# Patient Record
Sex: Female | Born: 1945 | Race: White | Hispanic: No | Marital: Single | Smoking: Former smoker
Health system: Southern US, Community
[De-identification: ages and names within clinical notes are randomized; demographics above are authoritative.]

## PROBLEM LIST (undated history)

## (undated) DIAGNOSIS — I10 Essential (primary) hypertension: Secondary | ICD-10-CM

## (undated) DIAGNOSIS — E785 Hyperlipidemia, unspecified: Secondary | ICD-10-CM

## (undated) DIAGNOSIS — G4700 Insomnia, unspecified: Secondary | ICD-10-CM

## (undated) DIAGNOSIS — G250 Essential tremor: Secondary | ICD-10-CM

## (undated) DIAGNOSIS — M1990 Unspecified osteoarthritis, unspecified site: Secondary | ICD-10-CM

## (undated) DIAGNOSIS — J45909 Unspecified asthma, uncomplicated: Secondary | ICD-10-CM

## (undated) DIAGNOSIS — E119 Type 2 diabetes mellitus without complications: Secondary | ICD-10-CM

## (undated) DIAGNOSIS — G2581 Restless legs syndrome: Secondary | ICD-10-CM

## (undated) DIAGNOSIS — F329 Major depressive disorder, single episode, unspecified: Secondary | ICD-10-CM

## (undated) DIAGNOSIS — G20A1 Parkinson's disease without dyskinesia, without mention of fluctuations: Secondary | ICD-10-CM

## (undated) DIAGNOSIS — G20 Parkinson's disease: Secondary | ICD-10-CM

## (undated) DIAGNOSIS — M810 Age-related osteoporosis without current pathological fracture: Secondary | ICD-10-CM

## (undated) DIAGNOSIS — Z8673 Personal history of transient ischemic attack (TIA), and cerebral infarction without residual deficits: Secondary | ICD-10-CM

## (undated) DIAGNOSIS — K219 Gastro-esophageal reflux disease without esophagitis: Secondary | ICD-10-CM

## (undated) DIAGNOSIS — F419 Anxiety disorder, unspecified: Secondary | ICD-10-CM

## (undated) DIAGNOSIS — F32A Depression, unspecified: Secondary | ICD-10-CM

## (undated) HISTORY — PX: ANKLE SURGERY: SHX546

## (undated) HISTORY — DX: Depression, unspecified: F32.A

## (undated) HISTORY — DX: Major depressive disorder, single episode, unspecified: F32.9

## (undated) HISTORY — DX: Personal history of transient ischemic attack (TIA), and cerebral infarction without residual deficits: Z86.73

## (undated) HISTORY — DX: Essential tremor: G25.0

## (undated) HISTORY — DX: Age-related osteoporosis without current pathological fracture: M81.0

## (undated) HISTORY — DX: Anxiety disorder, unspecified: F41.9

## (undated) HISTORY — PX: CATARACT EXTRACTION, BILATERAL: SHX1313

## (undated) HISTORY — PX: ABDOMINAL HYSTERECTOMY: SHX81

## (undated) HISTORY — PX: DEEP BRAIN STIMULATOR PLACEMENT: SHX608

## (undated) HISTORY — DX: Hyperlipidemia, unspecified: E78.5

## (undated) HISTORY — DX: Essential (primary) hypertension: I10

## (undated) HISTORY — PX: HAND SURGERY: SHX662

## (undated) HISTORY — DX: Restless legs syndrome: G25.81

## (undated) HISTORY — PX: EXPLORATORY LAPAROTOMY: SUR591

## (undated) HISTORY — DX: Unspecified asthma, uncomplicated: J45.909

## (undated) HISTORY — DX: Type 2 diabetes mellitus without complications: E11.9

---

## 2006-03-16 ENCOUNTER — Ambulatory Visit: Payer: Self-pay | Admitting: Specialist

## 2006-09-29 ENCOUNTER — Ambulatory Visit: Payer: Self-pay | Admitting: Specialist

## 2008-04-27 ENCOUNTER — Ambulatory Visit: Payer: Self-pay | Admitting: Specialist

## 2010-10-02 DIAGNOSIS — K219 Gastro-esophageal reflux disease without esophagitis: Secondary | ICD-10-CM | POA: Insufficient documentation

## 2010-10-02 DIAGNOSIS — E782 Mixed hyperlipidemia: Secondary | ICD-10-CM | POA: Insufficient documentation

## 2010-10-02 DIAGNOSIS — J449 Chronic obstructive pulmonary disease, unspecified: Secondary | ICD-10-CM | POA: Insufficient documentation

## 2010-10-02 DIAGNOSIS — M810 Age-related osteoporosis without current pathological fracture: Secondary | ICD-10-CM | POA: Insufficient documentation

## 2010-10-02 DIAGNOSIS — E785 Hyperlipidemia, unspecified: Secondary | ICD-10-CM | POA: Insufficient documentation

## 2011-06-02 DIAGNOSIS — E119 Type 2 diabetes mellitus without complications: Secondary | ICD-10-CM | POA: Insufficient documentation

## 2012-01-15 DIAGNOSIS — G2581 Restless legs syndrome: Secondary | ICD-10-CM | POA: Insufficient documentation

## 2012-04-03 HISTORY — PX: BURR HOLE W/ STEREOTACTIC INSERTION OF DBS LEADS / INTRAOP MICROELECTRODE RECORDING: SUR171

## 2012-11-18 DIAGNOSIS — B354 Tinea corporis: Secondary | ICD-10-CM | POA: Insufficient documentation

## 2013-03-18 DIAGNOSIS — G2581 Restless legs syndrome: Secondary | ICD-10-CM | POA: Diagnosis not present

## 2013-03-18 DIAGNOSIS — I10 Essential (primary) hypertension: Secondary | ICD-10-CM | POA: Diagnosis not present

## 2013-03-18 DIAGNOSIS — Z79899 Other long term (current) drug therapy: Secondary | ICD-10-CM | POA: Diagnosis not present

## 2013-03-18 DIAGNOSIS — J441 Chronic obstructive pulmonary disease with (acute) exacerbation: Secondary | ICD-10-CM | POA: Diagnosis not present

## 2013-03-18 DIAGNOSIS — M2550 Pain in unspecified joint: Secondary | ICD-10-CM | POA: Diagnosis not present

## 2013-03-18 DIAGNOSIS — E538 Deficiency of other specified B group vitamins: Secondary | ICD-10-CM | POA: Diagnosis not present

## 2013-03-29 DIAGNOSIS — H4011X0 Primary open-angle glaucoma, stage unspecified: Secondary | ICD-10-CM | POA: Diagnosis not present

## 2013-03-29 DIAGNOSIS — H353190 Nonexudative age-related macular degeneration, unspecified eye, stage unspecified: Secondary | ICD-10-CM | POA: Diagnosis not present

## 2013-03-29 DIAGNOSIS — H539 Unspecified visual disturbance: Secondary | ICD-10-CM | POA: Diagnosis not present

## 2013-03-29 DIAGNOSIS — H02839 Dermatochalasis of unspecified eye, unspecified eyelid: Secondary | ICD-10-CM | POA: Diagnosis not present

## 2013-07-19 DIAGNOSIS — I10 Essential (primary) hypertension: Secondary | ICD-10-CM | POA: Diagnosis not present

## 2013-07-19 DIAGNOSIS — E785 Hyperlipidemia, unspecified: Secondary | ICD-10-CM | POA: Diagnosis not present

## 2013-07-19 DIAGNOSIS — E538 Deficiency of other specified B group vitamins: Secondary | ICD-10-CM | POA: Diagnosis not present

## 2013-07-19 DIAGNOSIS — E119 Type 2 diabetes mellitus without complications: Secondary | ICD-10-CM | POA: Diagnosis not present

## 2013-07-19 DIAGNOSIS — Z79899 Other long term (current) drug therapy: Secondary | ICD-10-CM | POA: Diagnosis not present

## 2013-07-26 DIAGNOSIS — E538 Deficiency of other specified B group vitamins: Secondary | ICD-10-CM | POA: Diagnosis not present

## 2013-07-26 DIAGNOSIS — I10 Essential (primary) hypertension: Secondary | ICD-10-CM | POA: Diagnosis not present

## 2013-07-26 DIAGNOSIS — E119 Type 2 diabetes mellitus without complications: Secondary | ICD-10-CM | POA: Diagnosis not present

## 2013-07-26 DIAGNOSIS — Z79899 Other long term (current) drug therapy: Secondary | ICD-10-CM | POA: Diagnosis not present

## 2013-08-18 DIAGNOSIS — Z5181 Encounter for therapeutic drug level monitoring: Secondary | ICD-10-CM | POA: Diagnosis not present

## 2013-08-18 DIAGNOSIS — R4789 Other speech disturbances: Secondary | ICD-10-CM | POA: Diagnosis not present

## 2013-08-18 DIAGNOSIS — R4701 Aphasia: Secondary | ICD-10-CM | POA: Diagnosis not present

## 2013-08-18 DIAGNOSIS — G259 Extrapyramidal and movement disorder, unspecified: Secondary | ICD-10-CM | POA: Diagnosis not present

## 2013-08-18 DIAGNOSIS — R4182 Altered mental status, unspecified: Secondary | ICD-10-CM | POA: Diagnosis not present

## 2013-08-18 DIAGNOSIS — R259 Unspecified abnormal involuntary movements: Secondary | ICD-10-CM | POA: Diagnosis not present

## 2013-08-18 DIAGNOSIS — Z79899 Other long term (current) drug therapy: Secondary | ICD-10-CM | POA: Diagnosis not present

## 2013-08-18 DIAGNOSIS — I6789 Other cerebrovascular disease: Secondary | ICD-10-CM | POA: Diagnosis not present

## 2013-08-18 DIAGNOSIS — R42 Dizziness and giddiness: Secondary | ICD-10-CM | POA: Diagnosis not present

## 2013-08-23 DIAGNOSIS — G250 Essential tremor: Secondary | ICD-10-CM | POA: Diagnosis not present

## 2013-08-23 DIAGNOSIS — R4789 Other speech disturbances: Secondary | ICD-10-CM | POA: Diagnosis not present

## 2013-08-23 DIAGNOSIS — F8081 Childhood onset fluency disorder: Secondary | ICD-10-CM | POA: Diagnosis not present

## 2013-08-23 DIAGNOSIS — Z79899 Other long term (current) drug therapy: Secondary | ICD-10-CM | POA: Diagnosis not present

## 2013-08-23 DIAGNOSIS — Z9189 Other specified personal risk factors, not elsewhere classified: Secondary | ICD-10-CM | POA: Diagnosis not present

## 2013-08-23 DIAGNOSIS — Z5181 Encounter for therapeutic drug level monitoring: Secondary | ICD-10-CM | POA: Diagnosis not present

## 2013-08-23 DIAGNOSIS — R279 Unspecified lack of coordination: Secondary | ICD-10-CM | POA: Diagnosis not present

## 2013-08-24 DIAGNOSIS — R479 Unspecified speech disturbances: Secondary | ICD-10-CM | POA: Insufficient documentation

## 2013-08-24 DIAGNOSIS — R270 Ataxia, unspecified: Secondary | ICD-10-CM | POA: Insufficient documentation

## 2013-09-05 DIAGNOSIS — I671 Cerebral aneurysm, nonruptured: Secondary | ICD-10-CM | POA: Diagnosis not present

## 2013-09-05 DIAGNOSIS — F8081 Childhood onset fluency disorder: Secondary | ICD-10-CM | POA: Diagnosis not present

## 2013-09-05 DIAGNOSIS — Z9189 Other specified personal risk factors, not elsewhere classified: Secondary | ICD-10-CM | POA: Diagnosis not present

## 2013-09-05 DIAGNOSIS — I517 Cardiomegaly: Secondary | ICD-10-CM | POA: Diagnosis not present

## 2013-09-05 DIAGNOSIS — I658 Occlusion and stenosis of other precerebral arteries: Secondary | ICD-10-CM | POA: Diagnosis not present

## 2013-09-05 DIAGNOSIS — I6529 Occlusion and stenosis of unspecified carotid artery: Secondary | ICD-10-CM | POA: Diagnosis not present

## 2013-09-05 DIAGNOSIS — R279 Unspecified lack of coordination: Secondary | ICD-10-CM | POA: Diagnosis not present

## 2013-09-05 DIAGNOSIS — I7090 Unspecified atherosclerosis: Secondary | ICD-10-CM | POA: Diagnosis not present

## 2013-09-05 DIAGNOSIS — I059 Rheumatic mitral valve disease, unspecified: Secondary | ICD-10-CM | POA: Diagnosis not present

## 2013-09-22 DIAGNOSIS — G250 Essential tremor: Secondary | ICD-10-CM | POA: Diagnosis not present

## 2013-09-22 DIAGNOSIS — Z9181 History of falling: Secondary | ICD-10-CM | POA: Diagnosis not present

## 2013-09-22 DIAGNOSIS — Z79899 Other long term (current) drug therapy: Secondary | ICD-10-CM | POA: Diagnosis not present

## 2013-09-22 DIAGNOSIS — G252 Other specified forms of tremor: Secondary | ICD-10-CM | POA: Diagnosis not present

## 2013-09-22 DIAGNOSIS — R4789 Other speech disturbances: Secondary | ICD-10-CM | POA: Diagnosis not present

## 2013-09-22 DIAGNOSIS — Z7982 Long term (current) use of aspirin: Secondary | ICD-10-CM | POA: Diagnosis not present

## 2013-09-22 DIAGNOSIS — R269 Unspecified abnormalities of gait and mobility: Secondary | ICD-10-CM | POA: Diagnosis not present

## 2013-10-10 DIAGNOSIS — R296 Repeated falls: Secondary | ICD-10-CM | POA: Insufficient documentation

## 2013-11-02 DIAGNOSIS — I10 Essential (primary) hypertension: Secondary | ICD-10-CM | POA: Diagnosis not present

## 2013-11-02 DIAGNOSIS — R252 Cramp and spasm: Secondary | ICD-10-CM | POA: Diagnosis not present

## 2013-11-02 DIAGNOSIS — E119 Type 2 diabetes mellitus without complications: Secondary | ICD-10-CM | POA: Diagnosis not present

## 2013-11-02 DIAGNOSIS — Z79899 Other long term (current) drug therapy: Secondary | ICD-10-CM | POA: Diagnosis not present

## 2013-11-02 DIAGNOSIS — E538 Deficiency of other specified B group vitamins: Secondary | ICD-10-CM | POA: Diagnosis not present

## 2013-12-31 DIAGNOSIS — Z8673 Personal history of transient ischemic attack (TIA), and cerebral infarction without residual deficits: Secondary | ICD-10-CM | POA: Diagnosis not present

## 2013-12-31 DIAGNOSIS — R1110 Vomiting, unspecified: Secondary | ICD-10-CM | POA: Diagnosis not present

## 2013-12-31 DIAGNOSIS — Z87891 Personal history of nicotine dependence: Secondary | ICD-10-CM | POA: Diagnosis not present

## 2013-12-31 DIAGNOSIS — R531 Weakness: Secondary | ICD-10-CM | POA: Diagnosis not present

## 2013-12-31 DIAGNOSIS — Z23 Encounter for immunization: Secondary | ICD-10-CM | POA: Diagnosis not present

## 2013-12-31 DIAGNOSIS — R251 Tremor, unspecified: Secondary | ICD-10-CM | POA: Diagnosis not present

## 2013-12-31 DIAGNOSIS — R4701 Aphasia: Secondary | ICD-10-CM | POA: Diagnosis not present

## 2013-12-31 DIAGNOSIS — I6789 Other cerebrovascular disease: Secondary | ICD-10-CM | POA: Diagnosis not present

## 2013-12-31 DIAGNOSIS — Z885 Allergy status to narcotic agent status: Secondary | ICD-10-CM | POA: Diagnosis not present

## 2013-12-31 DIAGNOSIS — R262 Difficulty in walking, not elsewhere classified: Secondary | ICD-10-CM | POA: Diagnosis not present

## 2013-12-31 DIAGNOSIS — G459 Transient cerebral ischemic attack, unspecified: Secondary | ICD-10-CM | POA: Diagnosis not present

## 2014-01-01 DIAGNOSIS — R569 Unspecified convulsions: Secondary | ICD-10-CM | POA: Diagnosis not present

## 2014-01-01 DIAGNOSIS — Z9689 Presence of other specified functional implants: Secondary | ICD-10-CM | POA: Diagnosis present

## 2014-01-01 DIAGNOSIS — R51 Headache: Secondary | ICD-10-CM | POA: Diagnosis present

## 2014-01-01 DIAGNOSIS — R29898 Other symptoms and signs involving the musculoskeletal system: Secondary | ICD-10-CM | POA: Diagnosis not present

## 2014-01-01 DIAGNOSIS — E538 Deficiency of other specified B group vitamins: Secondary | ICD-10-CM | POA: Diagnosis present

## 2014-01-01 DIAGNOSIS — F329 Major depressive disorder, single episode, unspecified: Secondary | ICD-10-CM | POA: Diagnosis present

## 2014-01-01 DIAGNOSIS — E119 Type 2 diabetes mellitus without complications: Secondary | ICD-10-CM | POA: Diagnosis present

## 2014-01-01 DIAGNOSIS — G250 Essential tremor: Secondary | ICD-10-CM | POA: Diagnosis present

## 2014-01-01 DIAGNOSIS — Z9181 History of falling: Secondary | ICD-10-CM | POA: Diagnosis not present

## 2014-01-01 DIAGNOSIS — R251 Tremor, unspecified: Secondary | ICD-10-CM | POA: Diagnosis not present

## 2014-01-01 DIAGNOSIS — J449 Chronic obstructive pulmonary disease, unspecified: Secondary | ICD-10-CM | POA: Diagnosis present

## 2014-01-01 DIAGNOSIS — I671 Cerebral aneurysm, nonruptured: Secondary | ICD-10-CM | POA: Diagnosis present

## 2014-01-01 DIAGNOSIS — F419 Anxiety disorder, unspecified: Secondary | ICD-10-CM | POA: Diagnosis present

## 2014-01-01 DIAGNOSIS — M1990 Unspecified osteoarthritis, unspecified site: Secondary | ICD-10-CM | POA: Diagnosis present

## 2014-01-01 DIAGNOSIS — Z8673 Personal history of transient ischemic attack (TIA), and cerebral infarction without residual deficits: Secondary | ICD-10-CM | POA: Diagnosis not present

## 2014-01-01 DIAGNOSIS — F8081 Childhood onset fluency disorder: Secondary | ICD-10-CM | POA: Diagnosis not present

## 2014-01-01 DIAGNOSIS — Z885 Allergy status to narcotic agent status: Secondary | ICD-10-CM | POA: Diagnosis not present

## 2014-01-01 DIAGNOSIS — R4789 Other speech disturbances: Secondary | ICD-10-CM | POA: Diagnosis not present

## 2014-01-01 DIAGNOSIS — K5900 Constipation, unspecified: Secondary | ICD-10-CM | POA: Diagnosis present

## 2014-01-01 DIAGNOSIS — K219 Gastro-esophageal reflux disease without esophagitis: Secondary | ICD-10-CM | POA: Diagnosis present

## 2014-01-01 DIAGNOSIS — R112 Nausea with vomiting, unspecified: Secondary | ICD-10-CM | POA: Diagnosis present

## 2014-01-01 DIAGNOSIS — Z7401 Bed confinement status: Secondary | ICD-10-CM | POA: Diagnosis not present

## 2014-01-01 DIAGNOSIS — Z7409 Other reduced mobility: Secondary | ICD-10-CM | POA: Diagnosis present

## 2014-01-01 DIAGNOSIS — G2581 Restless legs syndrome: Secondary | ICD-10-CM | POA: Diagnosis present

## 2014-01-01 DIAGNOSIS — R404 Transient alteration of awareness: Secondary | ICD-10-CM | POA: Diagnosis not present

## 2014-01-01 DIAGNOSIS — Z87891 Personal history of nicotine dependence: Secondary | ICD-10-CM | POA: Diagnosis not present

## 2014-01-01 DIAGNOSIS — I10 Essential (primary) hypertension: Secondary | ICD-10-CM | POA: Diagnosis present

## 2014-01-01 DIAGNOSIS — Z23 Encounter for immunization: Secondary | ICD-10-CM | POA: Diagnosis not present

## 2014-01-01 DIAGNOSIS — R4701 Aphasia: Secondary | ICD-10-CM | POA: Diagnosis present

## 2014-01-01 DIAGNOSIS — Z90722 Acquired absence of ovaries, bilateral: Secondary | ICD-10-CM | POA: Diagnosis present

## 2014-01-01 DIAGNOSIS — R531 Weakness: Secondary | ICD-10-CM | POA: Diagnosis present

## 2014-01-01 DIAGNOSIS — Z9049 Acquired absence of other specified parts of digestive tract: Secondary | ICD-10-CM | POA: Diagnosis present

## 2014-01-01 DIAGNOSIS — Z90710 Acquired absence of both cervix and uterus: Secondary | ICD-10-CM | POA: Diagnosis not present

## 2014-01-01 DIAGNOSIS — E785 Hyperlipidemia, unspecified: Secondary | ICD-10-CM | POA: Diagnosis present

## 2014-01-05 DIAGNOSIS — G259 Extrapyramidal and movement disorder, unspecified: Secondary | ICD-10-CM | POA: Diagnosis not present

## 2014-01-05 DIAGNOSIS — R4789 Other speech disturbances: Secondary | ICD-10-CM | POA: Diagnosis not present

## 2014-01-05 DIAGNOSIS — R4189 Other symptoms and signs involving cognitive functions and awareness: Secondary | ICD-10-CM | POA: Diagnosis not present

## 2014-01-05 DIAGNOSIS — R569 Unspecified convulsions: Secondary | ICD-10-CM | POA: Diagnosis not present

## 2014-01-09 DIAGNOSIS — G252 Other specified forms of tremor: Secondary | ICD-10-CM | POA: Diagnosis not present

## 2014-01-09 DIAGNOSIS — G250 Essential tremor: Secondary | ICD-10-CM | POA: Diagnosis not present

## 2014-03-28 DIAGNOSIS — G20 Parkinson's disease: Secondary | ICD-10-CM | POA: Diagnosis not present

## 2014-03-28 DIAGNOSIS — G250 Essential tremor: Secondary | ICD-10-CM | POA: Diagnosis not present

## 2014-03-28 DIAGNOSIS — E119 Type 2 diabetes mellitus without complications: Secondary | ICD-10-CM | POA: Diagnosis not present

## 2014-03-28 DIAGNOSIS — E782 Mixed hyperlipidemia: Secondary | ICD-10-CM | POA: Diagnosis not present

## 2014-03-28 DIAGNOSIS — M810 Age-related osteoporosis without current pathological fracture: Secondary | ICD-10-CM | POA: Diagnosis not present

## 2014-03-28 DIAGNOSIS — I10 Essential (primary) hypertension: Secondary | ICD-10-CM | POA: Diagnosis not present

## 2014-03-28 DIAGNOSIS — K219 Gastro-esophageal reflux disease without esophagitis: Secondary | ICD-10-CM | POA: Diagnosis not present

## 2014-03-30 ENCOUNTER — Encounter: Payer: Self-pay | Admitting: Neurology

## 2014-04-17 ENCOUNTER — Encounter: Payer: Self-pay | Admitting: Neurology

## 2014-04-17 ENCOUNTER — Ambulatory Visit (INDEPENDENT_AMBULATORY_CARE_PROVIDER_SITE_OTHER): Payer: Medicare Other | Admitting: Neurology

## 2014-04-17 ENCOUNTER — Telehealth: Payer: Self-pay | Admitting: Neurology

## 2014-04-17 VITALS — BP 150/62 | HR 77 | Ht 62.0 in | Wt 149.0 lb

## 2014-04-17 DIAGNOSIS — G250 Essential tremor: Secondary | ICD-10-CM

## 2014-04-17 DIAGNOSIS — G43001 Migraine without aura, not intractable, with status migrainosus: Secondary | ICD-10-CM

## 2014-04-17 DIAGNOSIS — Z9889 Other specified postprocedural states: Secondary | ICD-10-CM

## 2014-04-17 DIAGNOSIS — I773 Arterial fibromuscular dysplasia: Secondary | ICD-10-CM | POA: Diagnosis not present

## 2014-04-17 DIAGNOSIS — Z5181 Encounter for therapeutic drug level monitoring: Secondary | ICD-10-CM

## 2014-04-17 DIAGNOSIS — Z9689 Presence of other specified functional implants: Secondary | ICD-10-CM | POA: Insufficient documentation

## 2014-04-17 DIAGNOSIS — R252 Cramp and spasm: Secondary | ICD-10-CM

## 2014-04-17 LAB — COMPREHENSIVE METABOLIC PANEL
ALK PHOS: 71 U/L (ref 39–117)
ALT: 22 U/L (ref 0–35)
AST: 24 U/L (ref 0–37)
Albumin: 3.9 g/dL (ref 3.5–5.2)
BILIRUBIN TOTAL: 0.3 mg/dL (ref 0.2–1.2)
BUN: 17 mg/dL (ref 6–23)
CALCIUM: 9.1 mg/dL (ref 8.4–10.5)
CO2: 26 mEq/L (ref 19–32)
CREATININE: 1.08 mg/dL (ref 0.50–1.10)
Chloride: 105 mEq/L (ref 96–112)
Glucose, Bld: 177 mg/dL — ABNORMAL HIGH (ref 70–99)
POTASSIUM: 4.2 meq/L (ref 3.5–5.3)
Sodium: 137 mEq/L (ref 135–145)
Total Protein: 6.4 g/dL (ref 6.0–8.3)

## 2014-04-17 LAB — SEDIMENTATION RATE: Sed Rate: 8 mm/hr (ref 0–30)

## 2014-04-17 LAB — CBC WITH DIFFERENTIAL/PLATELET
BASOS ABS: 0.1 10*3/uL (ref 0.0–0.1)
BASOS PCT: 1 % (ref 0–1)
EOS ABS: 0.2 10*3/uL (ref 0.0–0.7)
EOS PCT: 2 % (ref 0–5)
HCT: 38.1 % (ref 36.0–46.0)
Hemoglobin: 12 g/dL (ref 12.0–15.0)
Lymphocytes Relative: 22 % (ref 12–46)
Lymphs Abs: 2.1 10*3/uL (ref 0.7–4.0)
MCH: 25.9 pg — ABNORMAL LOW (ref 26.0–34.0)
MCHC: 31.5 g/dL (ref 30.0–36.0)
MCV: 82.3 fL (ref 78.0–100.0)
MPV: 12 fL (ref 8.6–12.4)
Monocytes Absolute: 0.6 10*3/uL (ref 0.1–1.0)
Monocytes Relative: 6 % (ref 3–12)
NEUTROS PCT: 69 % (ref 43–77)
Neutro Abs: 6.5 10*3/uL (ref 1.7–7.7)
Platelets: 212 10*3/uL (ref 150–400)
RBC: 4.63 MIL/uL (ref 3.87–5.11)
RDW: 14.9 % (ref 11.5–15.5)
WBC: 9.4 10*3/uL (ref 4.0–10.5)

## 2014-04-17 LAB — TSH: TSH: 1.655 u[IU]/mL (ref 0.350–4.500)

## 2014-04-17 MED ORDER — TIZANIDINE HCL 4 MG PO TABS: 8.0000 mg | ORAL_TABLET | Freq: Every day | ORAL | Status: DC

## 2014-04-17 NOTE — Patient Instructions (Addendum)
1.  Start zanaflex for headache and should help sleep:  1/2 tablet nightly for 1 week, then 1 tablet at night for 1 week, then 2 tablets at night thereafter.  You can slow this titration down if you get a hangover effect (makes you too tired during the day).   2. Your provider has requested that you have labwork completed today. Please go to [facility] on the first floor of this building before leaving the office today. 3. We will refer you to Dr Shawnee KnappLevy at [facility]. They will contact you directly with an appt. If you don't hear from them they can be contacted at [phone]. 4. You can try approx 2 oz of Tonic water in the morning and evening to help with leg cramps.

## 2014-04-17 NOTE — Progress Notes (Signed)
Subjective:   [facility] at the request of her PCP at [facility].  The evaluation is for tremor.  She is accompanied by her sister who supplements the history.  The records that were made available to me were reviewed.  I reviewed her [facility] records extensively.  The patient has a history of essential tremor with symptoms since 2009.  She has a strong family history of essential tremor, with her sister also having a DBS placed for essential tremor.  The patient has been on medications in the past for essential tremor including Topamax, propranolol, primidone and gabapentin.  She underwent bilateral VIM DBS at [facility] on 03/23/2012 for essential tremor.  She subsequently had generators placed into the chest and a few months later had the generator replaced in the abdomen because she was complaining about pain and mobility of the generator in the chest.  Over the course of time, the patient has had several episodes that have been investigated by [facility].  She has had ataxic gait and stuttering speech as well as changes in the speech that did not appear to be related to the DBS device.  She had a CT of the brain that did not demonstrate any new infarcts.  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  This is dated 09/05/2013.  There was also evidence of fibromuscular dysplasia in the bilateral high cervical internal carotid arteries.  The patient has also had shaking episodes that did not appear to be tremor.  These were captured on EEG and not felt to be epileptiform.  The patient is on Requip and calls this her "Parkinson's medication" according to [facility] (didn't do that here) although there has been no evidence that she has Parkinson's disease.  She uses that for RLS.  She states that it doesn't help but when I ask her in detail, it is a  cramping of the legs and feet and it occurs during the days and night.  She doesn't describe RLS sx's today.  She also c/o headache.  They are left sided (whole L sided).  They are throbbing and cramping.  She will have nausea and sometimes emesis and sometimes the emesis will come before the headache.  She has no photophobia or phonophobia.  She has had these since she had the surgery.  She didn't consider herself a headachy person prior to the surgery.  She has them bad one day a week but it is always there and dull the other days a week.  She has not been on a preventative headache drug (the preventative tremor drugs that could be used for headache were d/c prior to the surgery).  Current/Previously tried tremor medications: topamax, propranolol, primidone, gabapentin  Current medications that may exacerbate tremor:  n/a  Outside reports reviewed: historical medical records, radiology reports and referral letter/letters.  Allergies  Allergen Reactions  . Bupropion Other (See Comments)  . Codeine Other (See Comments)    Jittery  . Sulfa Antibiotics Other (See Comments)  . Tape Other (See Comments)    Uncoded Allergy. Allergen: BANDAIDS  . Trichlormethiazide Other (See Comments)  . Penicillins Rash  . Silver Rash    Patient has moderate blistering with tegaderm use    Outpatient Encounter Prescriptions as of 04/17/2014  Medication Sig  . aspirin EC 81 MG tablet  Take 81 mg by mouth daily.   . Cholecalciferol (VITAMIN D3) 1000 UNITS CAPS Take 1,000 Units by mouth daily.   . Cyanocobalamin 1000 MCG TBCR Take by mouth daily.   Marland Kitchen escitalopram (LEXAPRO) 20 MG tablet Take 20 mg by mouth daily.   . fluticasone (FLONASE) 50 MCG/ACT nasal spray Place 1 spray into the nose daily.   . Ipratropium-Albuterol (COMBIVENT) 20-100 MCG/ACT AERS respimat Inhale 1 puff into the lungs every 6 (six) hours as needed.   Marland Kitchen losartan (COZAAR) 100 MG tablet Take 100 mg by mouth daily. TAKE 1 TABLET (100 MG TOTAL)  BY MOUTH ONCE DAILY.  . metFORMIN (GLUCOPHAGE-XR) 500 MG 24 hr tablet Take 500 mg by mouth daily with breakfast.   . metoprolol succinate (TOPROL-XL) 50 MG 24 hr tablet Take 50 mg by mouth daily.   . montelukast (SINGULAIR) 10 MG tablet Take 10 mg by mouth at bedtime.   Marland Kitchen omeprazole (PRILOSEC) 20 MG capsule Take 20 mg by mouth daily. TAKE 1 CAPSULE (20 MG TOTAL) BY MOUTH DAILY.  . raloxifene (EVISTA) 60 MG tablet Take 60 mg by mouth daily.   Marland Kitchen rOPINIRole (REQUIP) 2 MG tablet Take 2 mg by mouth at bedtime.   . rosuvastatin (CRESTOR) 40 MG tablet Take 40 mg by mouth daily.     Past Medical History  Diagnosis Date  . Diabetes   . Hypertension   . Asthma   . Essential tremor   . Reflux   . Depression   . Anxiety     Past Surgical History  Procedure Laterality Date  . Burr hole w/ stereotactic insertion of dbs leads / intraop microelectrode recording  04/03/2012  . Abdominal hysterectomy    . Exploratory laparotomy    . Ankle surgery Right   . Hand surgery Bilateral   . Cataract extraction, bilateral      History   Social History  . Marital Status: Single    Spouse Name: N/A    Number of Children: N/A  . Years of Education: N/A   Occupational History  . retired     Manufacturing systems engineer for phones   Social History Main Topics  . Smoking status: Former Smoker    Quit date: 03/10/2012  . Smokeless tobacco: Not on file  . Alcohol Use: No  . Drug Use: No  . Sexual Activity: Not on file   Other Topics Concern  . Not on file   Social History Narrative    Family Status  Relation Status Death Age  . Mother Alive     dementia  . Father Deceased     MI  . Sister Alive     breast cancer  . Sister Alive     ET  . Sister Alive     heart disease  . Brother Alive     ET  . Son Alive     healthy    Review of Systems A complete 10 system ROS was obtained and was negative apart from what is mentioned.   Objective:   VITALS:   Filed Vitals:   04/17/14 0806    BP: 150/62  Pulse: 77  Height: 5\' 2"  (1.575 m)  Weight: 149 lb (67.586 kg)   Gen:  Appears stated age and in NAD. HEENT:  Normocephalic, atraumatic. The mucous membranes are moist. The superficial temporal arteries are without ropiness or tenderness. Cardiovascular: Regular rate and rhythm. Lungs: Clear to auscultation bilaterally. Neck: There are no carotid bruits noted bilaterally.  NEUROLOGICAL:  Orientation:  The patient is alert and oriented x 3.  Recent and remote memory are intact.  Attention span and concentration are normal.  Able to name objects and repeat without trouble.  Fund of knowledge is appropriate Cranial nerves: There is good facial symmetry. The pupils are equal round and reactive to light bilaterally. Fundoscopic exam reveals clear disc margins bilaterally. Extraocular muscles are intact and visual fields are full to confrontational testing. Speech is fluent and clear. Soft palate rises symmetrically and there is no tongue deviation. Hearing is intact to conversational tone. Tone: Tone is good throughout. Sensation: Sensation is intact to light touch and pinprick throughout (facial, trunk, extremities). Vibration is intact at the bilateral big toe. There is no extinction with double simultaneous stimulation. There is no sensory dermatomal level identified. Coordination:  The patient has no dysdiadichokinesia.  She has no difficulty with heel-to-shin.  She does have some difficulty with finger-nose-finger. Motor: Strength is 5/5 in the bilateral upper and lower extremities.  Grip strength is equal bilaterally, but it is decreased.  Shoulder shrug is equal bilaterally.  There is no pronator drift.  There are no fasciculations noted. DTR's: Deep tendon reflexes are 2+/4 at the bilateral biceps, triceps, brachioradialis, patella and achilles.  Plantar responses are downgoing bilaterally. Gait and Station: The patient ambulates with an astasia abasia quality to the gait.  She  bumps into the walls on both sides of the hallway and when she gets out of the chair in the exam room she lurches across the room and falls onto the examination table (about 8 feet away)' she doesn't fall on the floor.  She falls into the chair on the way back.    MOVEMENT EXAM: Tremor:  There is mild tremor in the UE, noted most significantly with action.  The patient has some difficulty drawing Archimedes spirals, left greater than right.  There is no tremor at rest.  She does have tremor when pouring a full glass of water from one glass to another, especially when the full glass of water is in the right hand.    DBS programming was performed today which is described in more detail on a separate programming procedure note.  In brief, there was improvement in the tremor with programming.  It should also be noted that the patient said several times "I feel that you are changing the device" when no changes were being made at all.     Assessment/Plan:   1.  Essential Tremor.  -She is status post bilateral VIM DBS at [facility] on 03/23/2012 with revision of the generator into the abdomen.  -I did increase her sitting somewhat today, as they are fairly low.  2.  Hx of episodic speech changes  -Investigated at [facility] and worked up for stroke.  Does not seem to be related to the DBS.  In fact, turning off DBS makes speech worse.  3.  Hx of episodic "shaking episodes" unrelated to tremor  -captured on EEG at [facility] and felt to be non-epileptogenic  -several non-physiologic aspects to examination and wonder if episodic inability to speak/shaking episodes are stress related.  Talked to her about this today.   4.  Possible fibromuscular dysplasia  -She will be referred to [facility] for an opinion regarding this.  5.  Leg cramping.  -We will try tonic water.  -Likely will d/c requip in the future.  Not helping and sx's not c/w RLS  6.  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   Discussed several agents, including going back on Topamax which she was previously on for tremor.  In the end, decided to start Zanaflex in hopes that it would help her insomnia.  Discussed that it could cause vivid dreams.  Risks, benefits, side effects and alternative therapies were discussed.  The opportunity to ask questions was given and they were answered to the best of my ability.  The patient expressed understanding and willingness to follow the outlined treatment protocols.  7.  Follow-up in the next few months, sooner should new neurologic issues arise.  Greater than 50% of the visit in counseling and coordinating care.

## 2014-04-17 NOTE — Telephone Encounter (Signed)
Referral to [facility]avel [facility] faxed to [phone] with confirmation received. They will contact patient with appt.

## 2014-04-17 NOTE — Procedures (Signed)
DBS Programming was performed.    Total time spent programming was 45 minutes.  Details on separate neurophys worksheet.   Device was confirmed to be on.  Soft start was confirmed to be on but was changed from 4-8.  Impedences were checked and were within normal limits.  Battery was checked and was determined to be functioning normally and not near the end of life.  Final settings were as follows:  Left brain electrode:     2-1+           ; Amplitude  2.6   V   ; Pulse width 90 microseconds;   Frequency   170   Hz.  Right brain electrode:     10-9+          ; Amplitude   2.6  V ;  Pulse width 90  microseconds;  Frequency   170    Hz.

## 2014-04-18 ENCOUNTER — Telehealth: Payer: Self-pay | Admitting: Neurology

## 2014-04-18 NOTE — Telephone Encounter (Signed)
Patient made aware.

## 2014-04-18 NOTE — Telephone Encounter (Signed)
-----   Message from Octaviano Battyebecca S Tat, DO sent at 04/18/2014  8:06 AM EST ----- Beth Young, you can let pt know labs look okay except glucose high, but known DM

## 2014-04-18 NOTE — Telephone Encounter (Signed)
Pt called/returning your call at 10:22AM. C/B [phone]

## 2014-04-18 NOTE — Telephone Encounter (Signed)
Left message on machine for patient to call back.

## 2014-04-25 DIAGNOSIS — E119 Type 2 diabetes mellitus without complications: Secondary | ICD-10-CM | POA: Diagnosis not present

## 2014-04-25 DIAGNOSIS — I773 Arterial fibromuscular dysplasia: Secondary | ICD-10-CM | POA: Insufficient documentation

## 2014-04-25 DIAGNOSIS — G250 Essential tremor: Secondary | ICD-10-CM | POA: Diagnosis not present

## 2014-04-25 DIAGNOSIS — R51 Headache: Secondary | ICD-10-CM | POA: Diagnosis not present

## 2014-04-25 DIAGNOSIS — I10 Essential (primary) hypertension: Secondary | ICD-10-CM | POA: Diagnosis not present

## 2014-05-16 DIAGNOSIS — E559 Vitamin D deficiency, unspecified: Secondary | ICD-10-CM | POA: Diagnosis not present

## 2014-05-16 DIAGNOSIS — K219 Gastro-esophageal reflux disease without esophagitis: Secondary | ICD-10-CM | POA: Diagnosis not present

## 2014-05-16 DIAGNOSIS — I10 Essential (primary) hypertension: Secondary | ICD-10-CM | POA: Diagnosis not present

## 2014-05-16 DIAGNOSIS — E782 Mixed hyperlipidemia: Secondary | ICD-10-CM | POA: Diagnosis not present

## 2014-05-16 DIAGNOSIS — E538 Deficiency of other specified B group vitamins: Secondary | ICD-10-CM | POA: Diagnosis not present

## 2014-05-16 DIAGNOSIS — E119 Type 2 diabetes mellitus without complications: Secondary | ICD-10-CM | POA: Diagnosis not present

## 2014-05-17 DIAGNOSIS — I10 Essential (primary) hypertension: Secondary | ICD-10-CM | POA: Diagnosis not present

## 2014-05-17 DIAGNOSIS — I773 Arterial fibromuscular dysplasia: Secondary | ICD-10-CM | POA: Diagnosis not present

## 2014-05-17 DIAGNOSIS — G44029 Chronic cluster headache, not intractable: Secondary | ICD-10-CM | POA: Diagnosis not present

## 2014-05-17 DIAGNOSIS — I6521 Occlusion and stenosis of right carotid artery: Secondary | ICD-10-CM | POA: Diagnosis not present

## 2014-05-17 DIAGNOSIS — G250 Essential tremor: Secondary | ICD-10-CM | POA: Diagnosis not present

## 2014-05-18 DIAGNOSIS — I10 Essential (primary) hypertension: Secondary | ICD-10-CM | POA: Diagnosis not present

## 2014-05-18 DIAGNOSIS — Z0000 Encounter for general adult medical examination without abnormal findings: Secondary | ICD-10-CM | POA: Diagnosis not present

## 2014-05-18 DIAGNOSIS — M810 Age-related osteoporosis without current pathological fracture: Secondary | ICD-10-CM | POA: Diagnosis not present

## 2014-05-18 DIAGNOSIS — Z23 Encounter for immunization: Secondary | ICD-10-CM | POA: Diagnosis not present

## 2014-05-24 DIAGNOSIS — R42 Dizziness and giddiness: Secondary | ICD-10-CM | POA: Diagnosis not present

## 2014-05-24 DIAGNOSIS — R55 Syncope and collapse: Secondary | ICD-10-CM | POA: Diagnosis not present

## 2014-05-24 DIAGNOSIS — R682 Dry mouth, unspecified: Secondary | ICD-10-CM | POA: Diagnosis not present

## 2014-05-24 DIAGNOSIS — R51 Headache: Secondary | ICD-10-CM | POA: Diagnosis not present

## 2014-05-24 DIAGNOSIS — Z79899 Other long term (current) drug therapy: Secondary | ICD-10-CM | POA: Diagnosis not present

## 2014-05-24 DIAGNOSIS — E780 Pure hypercholesterolemia: Secondary | ICD-10-CM | POA: Diagnosis not present

## 2014-05-24 DIAGNOSIS — I959 Hypotension, unspecified: Secondary | ICD-10-CM | POA: Diagnosis not present

## 2014-05-24 DIAGNOSIS — Z7982 Long term (current) use of aspirin: Secondary | ICD-10-CM | POA: Diagnosis not present

## 2014-05-24 DIAGNOSIS — Z90710 Acquired absence of both cervix and uterus: Secondary | ICD-10-CM | POA: Diagnosis not present

## 2014-05-24 DIAGNOSIS — G20 Parkinson's disease: Secondary | ICD-10-CM | POA: Diagnosis not present

## 2014-05-24 DIAGNOSIS — K219 Gastro-esophageal reflux disease without esophagitis: Secondary | ICD-10-CM | POA: Diagnosis not present

## 2014-05-24 DIAGNOSIS — Z885 Allergy status to narcotic agent status: Secondary | ICD-10-CM | POA: Diagnosis not present

## 2014-05-24 DIAGNOSIS — Z91040 Latex allergy status: Secondary | ICD-10-CM | POA: Diagnosis not present

## 2014-05-24 DIAGNOSIS — R4701 Aphasia: Secondary | ICD-10-CM | POA: Diagnosis not present

## 2014-05-24 DIAGNOSIS — I10 Essential (primary) hypertension: Secondary | ICD-10-CM | POA: Diagnosis not present

## 2014-05-24 DIAGNOSIS — E119 Type 2 diabetes mellitus without complications: Secondary | ICD-10-CM | POA: Diagnosis not present

## 2014-05-24 DIAGNOSIS — G319 Degenerative disease of nervous system, unspecified: Secondary | ICD-10-CM | POA: Diagnosis not present

## 2014-05-24 DIAGNOSIS — F985 Adult onset fluency disorder: Secondary | ICD-10-CM | POA: Diagnosis not present

## 2014-05-24 DIAGNOSIS — Z91048 Other nonmedicinal substance allergy status: Secondary | ICD-10-CM | POA: Diagnosis not present

## 2014-05-24 DIAGNOSIS — Z87891 Personal history of nicotine dependence: Secondary | ICD-10-CM | POA: Diagnosis not present

## 2014-05-24 DIAGNOSIS — G250 Essential tremor: Secondary | ICD-10-CM | POA: Diagnosis not present

## 2014-05-24 DIAGNOSIS — R4781 Slurred speech: Secondary | ICD-10-CM | POA: Diagnosis not present

## 2014-05-24 DIAGNOSIS — G2581 Restless legs syndrome: Secondary | ICD-10-CM | POA: Diagnosis not present

## 2014-05-24 DIAGNOSIS — Z9889 Other specified postprocedural states: Secondary | ICD-10-CM | POA: Diagnosis not present

## 2014-05-24 DIAGNOSIS — M6281 Muscle weakness (generalized): Secondary | ICD-10-CM | POA: Diagnosis not present

## 2014-05-24 DIAGNOSIS — Z8673 Personal history of transient ischemic attack (TIA), and cerebral infarction without residual deficits: Secondary | ICD-10-CM | POA: Diagnosis not present

## 2014-05-25 ENCOUNTER — Encounter: Payer: Self-pay | Admitting: Neurology

## 2014-05-29 ENCOUNTER — Ambulatory Visit: Payer: Medicare Other | Admitting: Neurology

## 2014-05-30 ENCOUNTER — Encounter: Payer: Self-pay | Admitting: Neurology

## 2014-05-30 ENCOUNTER — Telehealth: Payer: Self-pay | Admitting: Neurology

## 2014-05-30 ENCOUNTER — Ambulatory Visit (INDEPENDENT_AMBULATORY_CARE_PROVIDER_SITE_OTHER): Payer: Medicare Other | Admitting: Neurology

## 2014-05-30 VITALS — BP 138/64 | HR 72 | Ht 62.0 in | Wt 148.0 lb

## 2014-05-30 DIAGNOSIS — Z9889 Other specified postprocedural states: Secondary | ICD-10-CM | POA: Diagnosis not present

## 2014-05-30 DIAGNOSIS — G43701 Chronic migraine without aura, not intractable, with status migrainosus: Secondary | ICD-10-CM | POA: Diagnosis not present

## 2014-05-30 DIAGNOSIS — G250 Essential tremor: Secondary | ICD-10-CM | POA: Diagnosis not present

## 2014-05-30 DIAGNOSIS — Z9689 Presence of other specified functional implants: Secondary | ICD-10-CM

## 2014-05-30 DIAGNOSIS — R2681 Unsteadiness on feet: Secondary | ICD-10-CM | POA: Diagnosis not present

## 2014-05-30 DIAGNOSIS — R479 Unspecified speech disturbances: Secondary | ICD-10-CM | POA: Diagnosis not present

## 2014-05-30 DIAGNOSIS — R252 Cramp and spasm: Secondary | ICD-10-CM

## 2014-05-30 NOTE — Telephone Encounter (Signed)
Returning your call please call [phone]

## 2014-05-30 NOTE — Telephone Encounter (Signed)
Patient made aware MR to be scheduled 06/15/2014 at 12:00 pm. Grover Can[facility]Krystal [facility].

## 2014-05-30 NOTE — Procedures (Signed)
DBS Programming was performed.    Total time spent programming was 25 minutes.  Details on separate neurophys worksheet.   Device was confirmed to be on.  Soft start was confirmed to be on at 8.  Impedences were checked and were within normal limits.  Battery was checked and was determined to be functioning normally and not near the end of life.  Final settings were as follows:  Left brain electrode:     2-1+           ; Amplitude  2.7   V   ; Pulse width 90 microseconds;   Frequency   160   Hz.  Right brain electrode:     10-9+          ; Amplitude   2.7  V ;  Pulse width 90  microseconds;  Frequency   160    Hz.

## 2014-05-30 NOTE — Telephone Encounter (Signed)
Left message on machine for patient to call back. To see if okay for her MR brain to be scheduled on 06/15/2014 at 12:00pm. Awaiting call back.

## 2014-05-30 NOTE — Progress Notes (Signed)
Subjective:   Beth Young was seen in consultation in the movement disorder clinic at the request of her PCP at [facility].  The evaluation is for tremor.  She is accompanied by her sister who supplements the history.  The records that were made available to me were reviewed.  I reviewed her [facility] records extensively.  The patient has a history of essential tremor with symptoms since 2009.  She has a strong family history of essential tremor, with her sister also having a DBS placed for essential tremor.  The patient has been on medications in the past for essential tremor including Topamax, propranolol, primidone and gabapentin.  She underwent bilateral VIM DBS at [facility] on 03/23/2012 for essential tremor.  She subsequently had generators placed into the chest and a few months later had the generator replaced in the abdomen because she was complaining about pain and mobility of the generator in the chest.  Over the course of time, the patient has had several episodes that have been investigated by [facility].  She has had ataxic gait and stuttering speech as well as changes in the speech that did not appear to be related to the DBS device.  She had a CT of the brain that did not demonstrate any new infarcts.  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  This is dated 09/05/2013.  There was also evidence of fibromuscular dysplasia in the bilateral high cervical internal carotid arteries.  The patient has also had shaking episodes that did not appear to be tremor.  These were captured on EEG and not felt to be epileptiform.  The patient is on Requip and calls this her "Parkinson's medication" according to [facility] (didn't do that here) although there has been no evidence that she has Parkinson's disease.  She uses that for RLS.  She states that it doesn't help but when I ask her in detail, it is a  cramping of the legs and feet and it occurs during the days and night.  She doesn't describe RLS sx's today.  She also c/o headache.  They are left sided (whole L sided).  They are throbbing and cramping.  She will have nausea and sometimes emesis and sometimes the emesis will come before the headache.  She has no photophobia or phonophobia.  She has had these since she had the surgery.  She didn't consider herself a headachy person prior to the surgery.  She has them bad one day a week but it is always there and dull the other days a week.  She has not been on a preventative headache drug (the preventative tremor drugs that could be used for headache were d/c prior to the surgery).  05/30/14 update:  The patient is seen today, accompanied by her sister who supplements the history.  Patient has a history of essential tremor.  She is status post deep brain stimulator placement on 03/23/2012 at [facility].  I reviewed records since last visit.  She went to [facility] as [facility] had reported that she had fibromuscular dysplasia.  She saw a vascular specialist in this field at [facility].  There was no evidence on his examination of fibromuscular dysplasia.  She had a carotid ultrasound that demonstrated 40-50% stenosis in the left internal carotid artery and normal in the right internal carotid artery.  Her renal duplex was negative.  She had  a normal CRP and normal CMP.  However, he felt that she had uncontrolled HTN that should be followed up on.   Therefore, her metoprolol was increased and she began to feel bad.  Her BP dropped and she began to have falls.  She went to the ER where BP was low and losartan was d/c.   She states that she had emesis in the ER but had "my headaches."  She states that the zanaflex isn't really helping the headaches but may be helping sleep some.  Pt states that headaches are in the left occiput and are sharp in quality.  She can have emesis one day a week with them but headaches are daily.  She  sees black spots, like "gnats" in the visual field with them.  No photophobia.  No phonophobia.  Had them the last several years.  Has not been checking BS.   She states that they told her in the hospital that her parkinsons may be getting worse.  I explained to her that she doesn't have parkinsons, but she was appararently was told by her previous PCP that she had PD.  I noted this same comment previously in her [facility] records.  She remains on requip but she states that it isn't helping the cramping in the legs at night.  She describes no sx's c/w RLS.  She does state that after her BP dropped and she was in the hospital, her stuttering speech came back; "it happens every time."  Current/Previously tried tremor medications: topamax, propranolol, primidone, gabapentin, zanaflex  Current medications that may exacerbate tremor:  n/a  Outside reports reviewed: historical medical records, radiology reports and referral letter/letters.  Allergies  Allergen Reactions  . Bupropion Other (See Comments)  . Codeine Other (See Comments)    Jittery  . Sulfa Antibiotics Other (See Comments)  . Tape Other (See Comments)    Uncoded Allergy. Allergen: BANDAIDS  . Trichlormethiazide Other (See Comments)  . Penicillins Rash  . Silver Rash    Patient has moderate blistering with tegaderm use    Outpatient Encounter Prescriptions as of 05/30/2014  Medication Sig  . aspirin EC 81 MG tablet Take 81 mg by mouth daily.   . Cholecalciferol (VITAMIN D3) 1000 UNITS CAPS Take 1,000 Units by mouth daily.   . Cyanocobalamin 1000 MCG TBCR Take by mouth daily.   Marland Kitchen escitalopram (LEXAPRO) 20 MG tablet Take 20 mg by mouth daily.   . fluticasone (FLONASE) 50 MCG/ACT nasal spray Place 1 spray into the nose daily.   . Ipratropium-Albuterol (COMBIVENT) 20-100 MCG/ACT AERS respimat Inhale 1 puff into the lungs every 6 (six) hours as needed.   . metFORMIN (GLUCOPHAGE-XR) 500 MG 24 hr tablet Take 500 mg by mouth daily with  breakfast.   . metoprolol succinate (TOPROL-XL) 50 MG 24 hr tablet Take 50 mg by mouth daily.   . montelukast (SINGULAIR) 10 MG tablet Take 10 mg by mouth at bedtime.   Marland Kitchen omeprazole (PRILOSEC) 20 MG capsule Take 20 mg by mouth daily. TAKE 1 CAPSULE (20 MG TOTAL) BY MOUTH DAILY.  . raloxifene (EVISTA) 60 MG tablet Take 60 mg by mouth daily.   Marland Kitchen rOPINIRole (REQUIP) 2 MG tablet Take 2 mg by mouth at bedtime.   . rosuvastatin (CRESTOR) 40 MG tablet Take 40 mg by mouth daily.   Marland Kitchen tiZANidine (ZANAFLEX) 4 MG tablet Take 2 tablets (8 mg total) by mouth at bedtime.  . [DISCONTINUED] losartan (COZAAR) 100 MG tablet Take 100 mg by  mouth daily. TAKE 1 TABLET (100 MG TOTAL) BY MOUTH ONCE DAILY.    Past Medical History  Diagnosis Date  . Diabetes   . Hypertension   . Asthma   . Essential tremor   . Reflux   . Depression   . Anxiety     Past Surgical History  Procedure Laterality Date  . Burr hole w/ stereotactic insertion of dbs leads / intraop microelectrode recording  04/03/2012  . Abdominal hysterectomy    . Exploratory laparotomy    . Ankle surgery Right   . Hand surgery Bilateral   . Cataract extraction, bilateral      History   Social History  . Marital Status: Single    Spouse Name: N/A  . Number of Children: N/A  . Years of Education: N/A   Occupational History  . 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 Smoking status: Former Smoker    Quit date: 03/10/2012  . Smokeless tobacco: Not on file  . Alcohol Use: No  . Drug Use: No  . Sexual Activity: Not on file   Other Topics Concern  . Not on file   Social History Narrative    Family Status  Relation Status Death Age  . Mother Alive     dementia  . Father Deceased     MI  . Sister Alive     breast cancer  . Sister Alive     ET  . Sister Alive     heart disease  . Brother Alive     ET  . Son Alive     healthy    Review of Systems A complete 10 system ROS was obtained  and was negative apart from what is mentioned.   Objective:   VITALS:   Filed Vitals:   05/30/14 0750  BP: 138/64  Pulse: 72  Height: 5\' 2"  (1.575 m)  Weight: 148 lb (67.132 kg)   Gen:  Appears stated age and in NAD. HEENT:  Normocephalic, atraumatic. The mucous membranes are moist. The superficial temporal arteries are without ropiness or tenderness. Cardiovascular: Regular rate and rhythm. Lungs: Clear to auscultation bilaterally. Neck: There are no carotid bruits noted bilaterally.  NEUROLOGICAL:  Orientation:  The patient is alert and oriented x 3.  Recent and remote memory are intact.  Attention span and concentration are normal.  Able to name objects and repeat without trouble.  Fund of knowledge is appropriate Cranial nerves: There is good facial symmetry. The pupils are equal round and reactive to light bilaterally. Fundoscopic exam reveals clear disc margins bilaterally. Extraocular muscles are intact and visual fields are full to confrontational testing. Speech is intermittently with a stuttering quality and then, when distracted, it is very fluent and clear Soft palate rises symmetrically and there is no tongue deviation. Hearing is intact to conversational tone. Tone: Tone is good throughout.  No rigidity Sensation: Sensation is intact to light touch throughout Coordination:  The patient has no dysdiadichokinesia.  She has no difficulty with heel-to-shin.  She does have some difficulty with finger-nose-finger. Motor: Strength is 5/5 in the bilateral upper and lower extremities.  Grip strength is equal bilaterally, but it is decreased.  Shoulder shrug is equal bilaterally.  There is no pronator drift.  There are no fasciculations noted. Gait and Station: The patient ambulates with an astasia abasia quality to the gait.    MOVEMENT EXAM: Tremor:  There is no tremor today  DBS programming was performed today which is described in more detail on a separate programming procedure  note.       Assessment/Plan:   1.  Essential Tremor.  -She is status post bilateral VIM DBS at [facility] on 03/23/2012 with revision of the generator into the abdomen.  2.  Hx of episodic speech changes  -Investigated at [facility] and worked up for stroke.  Does not seem to be related to the DBS.  In fact, turning off DBS makes speech worse.  -suspect psychogenic based on examination today.  Will do MRI if device compatible given new FDA indication.  Device is in bipolar mode as required by FDA  -send to ST  3.  Hx of episodic "shaking episodes" unrelated to tremor  -captured on EEG at [facility] and felt to be non-epileptogenic   4.  Possible fibromuscular dysplasia  -She saw a vascular specialist in this field at [facility].  There was no evidence on his examination of fibromuscular dysplasia.  5.  Leg cramping.  -We will try tonic water.  -d/c requip as was being used for RLS and has no sx's c/w RLS.  Explained to patient in detail that she doesn' have PD as she kept referring to her parkinsons disease.  No evidence of parkinsons disease.  6.  Chronic migraine with aura  -talked about prophylaxis.  Tried and failed several (topamax, zanaflex, propranolol, gabapentin).  Talked to her about botox.  The patient was educated on the botulinum toxin the black blox warning and given a copy of the botox patient medication guide.  The patient understands that this warning states that there have been reported cases of the Botox extending beyond the injection site and creating adverse effects, similar to those of botulism. This included loss of strength, trouble walking, hoarseness, trouble saying words clearly, loss of bladder control, trouble breathing, trouble swallowing, diplopia, blurry vision and ptosis. Most of the distant spread of Botox was happening in patients, primarily children, who received medication for spasticity or for cervical dystonia. The patient expressed understanding and desire to proceed.  She  is interested and will try to get prior auth  7.  Gait instability  -some appears non-physiologic but some could be related to BP changes, changes with ET, etc.  Will send for PT  8.  Follow-up in the next few months, sooner should new neurologic issues arise.  Greater than 50% of the 40 min visit in counseling and coordinating care.  This does not include programming time.

## 2014-05-30 NOTE — Patient Instructions (Signed)
1. Decrease Requip to 1/2 tablet for one week and then stop.  2. We will let you know when Botox is approved.

## 2014-06-01 DIAGNOSIS — I10 Essential (primary) hypertension: Secondary | ICD-10-CM | POA: Diagnosis not present

## 2014-06-15 ENCOUNTER — Ambulatory Visit (HOSPITAL_COMMUNITY)
Admission: RE | Admit: 2014-06-15 | Discharge: 2014-06-15 | Disposition: A | Discharge status: Home / Self Care | Payer: Medicare Other | Source: Ambulatory Visit | Attending: Neurology | Admitting: Neurology

## 2014-06-15 ENCOUNTER — Encounter (HOSPITAL_COMMUNITY): Payer: Self-pay

## 2014-06-15 DIAGNOSIS — E119 Type 2 diabetes mellitus without complications: Secondary | ICD-10-CM | POA: Diagnosis not present

## 2014-06-15 DIAGNOSIS — G43701 Chronic migraine without aura, not intractable, with status migrainosus: Secondary | ICD-10-CM | POA: Diagnosis not present

## 2014-06-15 DIAGNOSIS — R479 Unspecified speech disturbances: Secondary | ICD-10-CM | POA: Diagnosis not present

## 2014-06-15 DIAGNOSIS — Z4542 Encounter for adjustment and management of neuropacemaker (brain) (peripheral nerve) (spinal cord): Secondary | ICD-10-CM | POA: Diagnosis not present

## 2014-06-15 DIAGNOSIS — R2681 Unsteadiness on feet: Secondary | ICD-10-CM | POA: Diagnosis not present

## 2014-06-15 DIAGNOSIS — E785 Hyperlipidemia, unspecified: Secondary | ICD-10-CM | POA: Diagnosis not present

## 2014-06-19 ENCOUNTER — Ambulatory Visit (HOSPITAL_COMMUNITY): Payer: Medicare Other | Admitting: Speech Pathology

## 2014-07-07 ENCOUNTER — Ambulatory Visit (INDEPENDENT_AMBULATORY_CARE_PROVIDER_SITE_OTHER): Payer: Medicare Other | Admitting: Neurology

## 2014-07-07 DIAGNOSIS — G43701 Chronic migraine without aura, not intractable, with status migrainosus: Secondary | ICD-10-CM

## 2014-07-07 DIAGNOSIS — IMO0002 Reserved for concepts with insufficient information to code with codable children: Secondary | ICD-10-CM

## 2014-07-07 DIAGNOSIS — G43709 Chronic migraine without aura, not intractable, without status migrainosus: Secondary | ICD-10-CM

## 2014-07-07 MED ORDER — ONABOTULINUMTOXINA 100 UNITS IJ SOLR
100.0000 [IU] | Freq: Once | INTRAMUSCULAR | Status: AC
  Administered 2014-07-07: 100 [IU] via INTRAMUSCULAR

## 2014-07-07 NOTE — Procedures (Signed)
Botulinum Clinic   Procedure Note Botox  Attending: Dr. Lurena Joinerebecca Shamaine Mulkern  Preoperative Diagnosis(es): Blepharospasm    Consent obtained from: The patient Benefits discussed included, but were not limited to ability to open eyes and therefore see better.  Risk discussed included, but were not limited pain and discomfort, bleeding, bruising, excessive weakness, venous thrombosis, muscle atrophy and drooping of eyelids.  A copy of the patient medication guide was given to the patient which explains the blackbox warning.  Patients identity and treatment sites confirmed Yes.  .  Details of Procedure: Skin was cleaned with alcohol.  A 30 gauge, 1/2 inch needle was introduced to the target muscle.  Prior to injection, the needle plunger was aspirated with each injection to make sure the needle was not within a blood vessel.  There was no blood retrieved on aspiration.    Following is a summary of the muscles injected  And the amount of Botulinum toxin used:   Dilution 0.9% preservative free saline mixed with 100 u Botox type A to make 5 U per 0.1cc (2 cc of saline used per 100 U botox)  Injections  Right frontalis: 5 units + 5 units for a total of 10 units Left frontalis:  5 units + 5 units for a total of 10 units Right corrugator: 2.5 units + 2.5 units for a total of 5 units Left corrugator:  2.5 units + 2.5 units for a total of 5 units Procereus:  5 units Left temporalis:  5 units + 5 units for a total of 10 units Right temporalis:  5 units + 5 units for a total of 10 units Right cervical paraspinals:  5 units + 5 units for a total of 10 units Left cervical paraspinals:  5 units + 5 units for a total of 10 units Right occiput:  5 units + 5 units for a total of 10 units Left occiput:  5 units + 5 units for a total of 10 units --------------------------------------------------------------------------------------  A total of 95 units is injected.  5 units is wasted.  Agent: Botulinum Type  A ( Onobotulinum Toxin type A ).  1 vials of Botox were used, each containing 50 units and freshly diluted with 2 mL of sterile, non-perserved saline   Total injected (Units): 95  Total wasted (Units): 5   Pt tolerated procedure well without complications.   Reinjection is anticipated in 3 months.  Pt given migraine diary to keep.

## 2014-07-18 ENCOUNTER — Ambulatory Visit: Payer: Medicare Other | Admitting: Neurology

## 2014-08-08 DIAGNOSIS — R112 Nausea with vomiting, unspecified: Secondary | ICD-10-CM | POA: Insufficient documentation

## 2014-08-09 DIAGNOSIS — G2581 Restless legs syndrome: Secondary | ICD-10-CM | POA: Diagnosis not present

## 2014-08-09 DIAGNOSIS — Z885 Allergy status to narcotic agent status: Secondary | ICD-10-CM | POA: Diagnosis not present

## 2014-08-09 DIAGNOSIS — I10 Essential (primary) hypertension: Secondary | ICD-10-CM | POA: Diagnosis not present

## 2014-08-09 DIAGNOSIS — G250 Essential tremor: Secondary | ICD-10-CM | POA: Diagnosis not present

## 2014-08-09 DIAGNOSIS — Z8673 Personal history of transient ischemic attack (TIA), and cerebral infarction without residual deficits: Secondary | ICD-10-CM | POA: Diagnosis not present

## 2014-08-09 DIAGNOSIS — R42 Dizziness and giddiness: Secondary | ICD-10-CM | POA: Diagnosis not present

## 2014-08-09 DIAGNOSIS — E785 Hyperlipidemia, unspecified: Secondary | ICD-10-CM | POA: Diagnosis not present

## 2014-08-09 DIAGNOSIS — Z7982 Long term (current) use of aspirin: Secondary | ICD-10-CM | POA: Diagnosis not present

## 2014-08-09 DIAGNOSIS — Z87891 Personal history of nicotine dependence: Secondary | ICD-10-CM | POA: Diagnosis not present

## 2014-08-09 DIAGNOSIS — R4701 Aphasia: Secondary | ICD-10-CM | POA: Diagnosis not present

## 2014-08-09 DIAGNOSIS — R51 Headache: Secondary | ICD-10-CM | POA: Diagnosis not present

## 2014-08-09 DIAGNOSIS — K219 Gastro-esophageal reflux disease without esophagitis: Secondary | ICD-10-CM | POA: Diagnosis not present

## 2014-08-09 DIAGNOSIS — R471 Dysarthria and anarthria: Secondary | ICD-10-CM | POA: Diagnosis not present

## 2014-08-09 DIAGNOSIS — R112 Nausea with vomiting, unspecified: Secondary | ICD-10-CM | POA: Diagnosis not present

## 2014-08-09 DIAGNOSIS — Z9889 Other specified postprocedural states: Secondary | ICD-10-CM | POA: Diagnosis not present

## 2014-08-09 DIAGNOSIS — E119 Type 2 diabetes mellitus without complications: Secondary | ICD-10-CM | POA: Diagnosis not present

## 2014-08-09 DIAGNOSIS — Z79899 Other long term (current) drug therapy: Secondary | ICD-10-CM | POA: Diagnosis not present

## 2014-08-09 DIAGNOSIS — R4702 Dysphasia: Secondary | ICD-10-CM | POA: Diagnosis not present

## 2014-08-10 DIAGNOSIS — G250 Essential tremor: Secondary | ICD-10-CM | POA: Diagnosis not present

## 2014-08-10 DIAGNOSIS — R51 Headache: Secondary | ICD-10-CM | POA: Diagnosis not present

## 2014-08-10 DIAGNOSIS — R4701 Aphasia: Secondary | ICD-10-CM | POA: Diagnosis not present

## 2014-08-11 ENCOUNTER — Encounter: Payer: Self-pay | Admitting: Neurology

## 2014-08-11 ENCOUNTER — Ambulatory Visit (INDEPENDENT_AMBULATORY_CARE_PROVIDER_SITE_OTHER): Payer: Medicare Other | Admitting: Neurology

## 2014-08-11 VITALS — BP 128/60 | HR 80 | Ht 62.0 in | Wt 147.0 lb

## 2014-08-11 DIAGNOSIS — H8110 Benign paroxysmal vertigo, unspecified ear: Secondary | ICD-10-CM

## 2014-08-11 DIAGNOSIS — R42 Dizziness and giddiness: Secondary | ICD-10-CM | POA: Diagnosis not present

## 2014-08-11 DIAGNOSIS — G250 Essential tremor: Secondary | ICD-10-CM | POA: Diagnosis not present

## 2014-08-11 DIAGNOSIS — Z9889 Other specified postprocedural states: Secondary | ICD-10-CM

## 2014-08-11 DIAGNOSIS — Z9689 Presence of other specified functional implants: Secondary | ICD-10-CM

## 2014-08-11 NOTE — Progress Notes (Signed)
Subjective:   Beth Young was seen in consultation in the movement disorder clinic at the request of her PCP at [facility].  The evaluation is for tremor.  She is accompanied by her sister who supplements the history.  The records that were made available to me were reviewed.  I reviewed her [facility] records extensively.  The patient has a history of essential tremor with symptoms since 2009.  She has a strong family history of essential tremor, with her sister also having a DBS placed for essential tremor.  The patient has been on medications in the past for essential tremor including Topamax, propranolol, primidone and gabapentin.  She underwent bilateral VIM DBS at [facility] on 03/23/2012 for essential tremor.  She subsequently had generators placed into the chest and a few months later had the generator replaced in the abdomen because she was complaining about pain and mobility of the generator in the chest.  Over the course of time, the patient has had several episodes that have been investigated by [facility].  She has had ataxic gait and stuttering speech as well as changes in the speech that did not appear to be related to the DBS device.  She had a CT of the brain that did not demonstrate any new infarcts.  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  This is dated 09/05/2013.  There was also evidence of fibromuscular dysplasia in the bilateral high cervical internal carotid arteries.  The patient has also had shaking episodes that did not appear to be tremor.  These were captured on EEG and not felt to be epileptiform.  The patient is on Requip and calls this her "Parkinson's medication" according to [facility] (didn't do that here) although there has been no evidence that she has Parkinson's disease.  She uses that for RLS.  She states that it doesn't help but when I ask her in detail, it is a  cramping of the legs and feet and it occurs during the days and night.  She doesn't describe RLS sx's today.  She also c/o headache.  They are left sided (whole L sided).  They are throbbing and cramping.  She will have nausea and sometimes emesis and sometimes the emesis will come before the headache.  She has no photophobia or phonophobia.  She has had these since she had the surgery.  She didn't consider herself a headachy person prior to the surgery.  She has them bad one day a week but it is always there and dull the other days a week.  She has not been on a preventative headache drug (the preventative tremor drugs that could be used for headache were d/c prior to the surgery).  05/30/14 update:  The patient is seen today, accompanied by her sister who supplements the history.  Patient has a history of essential tremor.  She is status post deep brain stimulator placement on 03/23/2012 at [facility].  I reviewed records since last visit.  She went to [facility] as [facility] had reported that she had fibromuscular dysplasia.  She saw a vascular specialist in this field at [facility].  There was no evidence on his examination of fibromuscular dysplasia.  She had a carotid ultrasound that demonstrated 40-50% stenosis in the left internal carotid artery and normal in the right internal carotid artery.  Her renal duplex was negative.  She had  a normal CRP and normal CMP.  However, he felt that she had uncontrolled HTN that should be followed up on.   Therefore, her metoprolol was increased and she began to feel bad.  Her BP dropped and she began to have falls.  She went to the ER where BP was low and losartan was d/c.   She states that she had emesis in the ER but had "my headaches."  She states that the zanaflex isn't really helping the headaches but may be helping sleep some.  Pt states that headaches are in the left occiput and are sharp in quality.  She can have emesis one day a week with them but headaches are daily.  She  sees black spots, like "gnats" in the visual field with them.  No photophobia.  No phonophobia.  Had them the last several years.  Has not been checking BS.   She states that they told her in the hospital that her parkinsons may be getting worse.  I explained to her that she doesn't have parkinsons, but she was appararently was told by her previous PCP that she had PD.  I noted this same comment previously in her [facility] records.  She remains on requip but she states that it isn't helping the cramping in the legs at night.  She describes no sx's c/w RLS.  She does state that after her BP dropped and she was in the hospital, her stuttering speech came back; "it happens every time."  08/11/14 update:  Pt requested work in appt today.  Went to [facility] 2 days ago and was released yesterday.  I don't have records.  She had acute onset of dizziness, bp elevation and emesis.  She states that her BP at home was 183/53 and was in the 190's when she went to her PCP.  She states that after her BP was elevated she got a bad headache.  They apparently did a head CT that was negative.  She did bring in labs including cmp and UA that was normal.  She was told at the hospital that she couldn't have anything for the headache because she got botox.  She was told this by a neurologist through telemedicine at [facility].  She states that she had previously been doing great with the botox.  She does state that this was her normal headache but the dizziness was different.  She states that the dizziness is spinning.  It is worse with bending over.  No nausea or emesis since released.  Current/Previously tried tremor medications: topamax, propranolol, primidone, gabapentin, zanaflex  Current medications that may exacerbate tremor:  n/a  Outside reports reviewed: historical medical records, radiology reports and referral letter/letters.  Allergies  Allergen Reactions  . Bupropion Other (See Comments)  . Codeine Other (See  Comments)    Jittery  . Sulfa Antibiotics Other (See Comments)  . Tape Other (See Comments)    Uncoded Allergy. Allergen: BANDAIDS  . Trichlormethiazide Other (See Comments)  . Penicillins Rash  . Silver Rash    Patient has moderate blistering with tegaderm use    Outpatient Encounter Prescriptions as of 08/11/2014  Medication Sig  . aspirin EC 81 MG tablet Take 81 mg by mouth daily.   . Cholecalciferol (VITAMIN D3) 1000 UNITS CAPS Take 1,000 Units by mouth daily.   . Cyanocobalamin 1000 MCG TBCR Take by mouth daily.   Marland Kitchen. escitalopram (LEXAPRO) 20 MG tablet Take 20 mg by mouth daily.   . fluticasone (FLONASE)  50 MCG/ACT nasal spray Place 1 spray into the nose daily.   . Ipratropium-Albuterol (COMBIVENT) 20-100 MCG/ACT AERS respimat Inhale 1 puff into the lungs every 6 (six) hours as needed.   . metFORMIN (GLUCOPHAGE-XR) 500 MG 24 hr tablet Take 500 mg by mouth daily with breakfast.   . metoprolol succinate (TOPROL-XL) 50 MG 24 hr tablet Take 50 mg by mouth daily.   . montelukast (SINGULAIR) 10 MG tablet Take 10 mg by mouth at bedtime.   Marland Kitchen omeprazole (PRILOSEC) 20 MG capsule Take 20 mg by mouth daily. TAKE 1 CAPSULE (20 MG TOTAL) BY MOUTH DAILY.  Marland Kitchen rOPINIRole (REQUIP) 2 MG tablet Take 2 mg by mouth at bedtime.   . rosuvastatin (CRESTOR) 40 MG tablet Take 40 mg by mouth daily.   Marland Kitchen tiZANidine (ZANAFLEX) 4 MG tablet Take 2 tablets (8 mg total) by mouth at bedtime.   No facility-administered encounter medications on file as of 08/11/2014.    Past Medical History  Diagnosis Date  . Diabetes   . Hypertension   . Asthma   . Essential tremor   . Reflux   . Depression   . Anxiety     Past Surgical History  Procedure Laterality Date  . Burr hole w/ stereotactic insertion of dbs leads / intraop microelectrode recording  04/03/2012  . Abdominal hysterectomy    . Exploratory laparotomy    . Ankle surgery Right   . Hand surgery Bilateral   . Cataract extraction, bilateral    . Deep brain  stimulator placement      Medtronic ACTIVA PC    History   Social History  . Marital Status: Single    Spouse Name: N/A  . Number of Children: N/A  . Years of Education: N/A   Occupational History  . retired     Manufacturing systems engineer for phones   Social History Main Topics  . Smoking status: Former Smoker    Quit date: 03/10/2012  . Smokeless tobacco: Not on file  . Alcohol Use: No  . Drug Use: No  . Sexual Activity: Not on file   Other Topics Concern  . Not on file   Social History Narrative    Family Status  Relation Status Death Age  . Mother Alive     dementia  . Father Deceased     MI  . Sister Alive     breast cancer  . Sister Alive     ET  . Sister Alive     heart disease  . Brother Alive     ET  . Son Alive     healthy    Review of Systems A complete 10 system ROS was obtained and was negative apart from what is mentioned.   Objective:   VITALS:   Filed Vitals:   08/11/14 0944  BP: 128/60  Pulse: 80  Height:  (1.575 m)  Weight: 147 lb (66.679 kg)   Gen:  Appears stated age and in NAD. HEENT:  Normocephalic, atraumatic. The mucous membranes are moist. The superficial temporal arteries are without ropiness or tenderness. Cardiovascular: Regular rate and rhythm. Lungs: Clear to auscultation bilaterally. Neck: There are no carotid bruits noted bilaterally.  NEUROLOGICAL:  Orientation:  The patient is alert and oriented x 3.   Cranial nerves: There is good facial symmetry. The pupils are equal round and reactive to light bilaterally. Fundoscopic exam reveals clear disc margins bilaterally. Extraocular muscles are intact and visual fields are full to confrontational  testing. Speech is intermittently with a stuttering quality and then, when distracted, it is very fluent and clear Soft palate rises symmetrically and there is no tongue deviation. Hearing is intact to conversational tone. Tone: Tone is good throughout.  No rigidity Sensation:  Sensation is intact to light touch throughout Coordination:  The patient has no dysdiadichokinesia.  She has no difficulty with heel-to-shin.  She does have some difficulty with finger-nose-finger. Motor: Strength is 5/5 in the bilateral upper and lower extremities.  Grip strength is equal bilaterally, but it is decreased.  Shoulder shrug is equal bilaterally.  There is no pronator drift.  There are no fasciculations noted. Gait and Station: The patient ambulates well today  MOVEMENT EXAM: Tremor:  There is no tremor today  Ryder System pix maneuver was negative     Assessment/Plan:   1.  Essential Tremor.  -She is status post bilateral VIM DBS at [facility] on 03/23/2012 with revision of the generator into the abdomen.  2.  Hx of episodic speech changes  -Investigated at [facility] and worked up for stroke.  Does not seem to be related to the DBS.  In fact, turning off DBS makes speech worse.  -suspect psychogenic based on examination today.  Will do MRI if device compatible given new FDA indication.  Device is in bipolar mode as required by FDA  -send to ST  3.  Hx of episodic "shaking episodes" unrelated to tremor  -captured on EEG at [facility] and felt to be non-epileptogenic   4.  Possible fibromuscular dysplasia  -She saw a vascular specialist in this field at [facility].  There was no evidence on his examination of fibromuscular dysplasia.  5.  Leg cramping.  -We will try tonic water.  -d/c requip as was being used for RLS and has no sx's c/w RLS.  Explained to patient in detail that she doesn' have PD as she kept referring to her parkinsons disease.  No evidence of parkinsons disease.  6.  Chronic migraine with aura  -talked about prophylaxis.  Tried and failed several (topamax, zanaflex, propranolol, gabapentin).  Overall, doing better after first injection of botox therapy  7.  Gait instability  -better today  8.  Dizziness  -Recent hospitalization.  I don't have records, with the exception  of labs.  Could be BPPV, but Dix Hallpike maneuver was negative.  She also states that she is doing much better.  This could also have just been her migraine.  Could also have been an episode of hypertensive urgency, as she reports that her blood pressure is very high and then she had emesis and headache and dizziness.  Regardless, she is better now.  I told her if the dizziness resumes, I will send her for vestibular rehabilitation.  We talked about the pathophysiology of BPPV.  We talked about safety in the home.  Greater than 50% to 25 minute visit in counseling.  9.  She'll follow-up at her previously scheduled visit.

## 2014-08-16 ENCOUNTER — Telehealth: Payer: Self-pay | Admitting: Neurology

## 2014-08-16 NOTE — Telephone Encounter (Signed)
I have not faxed a referral. No mention of referral in Dr Don Perkingat's note.

## 2014-08-16 NOTE — Telephone Encounter (Signed)
Received call from [facility], stating we had faxed a referral for this patient for a vascular surgeon. The called said this is not done at their office?  / Oneita KrasSherri S

## 2014-08-29 DIAGNOSIS — I10 Essential (primary) hypertension: Secondary | ICD-10-CM | POA: Diagnosis not present

## 2014-08-29 DIAGNOSIS — E782 Mixed hyperlipidemia: Secondary | ICD-10-CM | POA: Diagnosis not present

## 2014-08-29 DIAGNOSIS — K219 Gastro-esophageal reflux disease without esophagitis: Secondary | ICD-10-CM | POA: Diagnosis not present

## 2014-08-29 DIAGNOSIS — E119 Type 2 diabetes mellitus without complications: Secondary | ICD-10-CM | POA: Diagnosis not present

## 2014-09-06 DIAGNOSIS — J449 Chronic obstructive pulmonary disease, unspecified: Secondary | ICD-10-CM | POA: Insufficient documentation

## 2014-09-06 DIAGNOSIS — I10 Essential (primary) hypertension: Secondary | ICD-10-CM | POA: Diagnosis not present

## 2014-09-06 DIAGNOSIS — M810 Age-related osteoporosis without current pathological fracture: Secondary | ICD-10-CM | POA: Diagnosis not present

## 2014-09-06 DIAGNOSIS — E119 Type 2 diabetes mellitus without complications: Secondary | ICD-10-CM | POA: Diagnosis not present

## 2014-09-21 DIAGNOSIS — E119 Type 2 diabetes mellitus without complications: Secondary | ICD-10-CM | POA: Diagnosis not present

## 2014-09-21 DIAGNOSIS — M549 Dorsalgia, unspecified: Secondary | ICD-10-CM | POA: Diagnosis not present

## 2014-09-21 DIAGNOSIS — M810 Age-related osteoporosis without current pathological fracture: Secondary | ICD-10-CM | POA: Diagnosis not present

## 2014-09-21 DIAGNOSIS — Z87891 Personal history of nicotine dependence: Secondary | ICD-10-CM | POA: Diagnosis not present

## 2014-09-21 DIAGNOSIS — E780 Pure hypercholesterolemia: Secondary | ICD-10-CM | POA: Diagnosis not present

## 2014-09-21 DIAGNOSIS — M85851 Other specified disorders of bone density and structure, right thigh: Secondary | ICD-10-CM | POA: Diagnosis not present

## 2014-09-21 DIAGNOSIS — Z79899 Other long term (current) drug therapy: Secondary | ICD-10-CM | POA: Diagnosis not present

## 2014-09-21 DIAGNOSIS — Z780 Asymptomatic menopausal state: Secondary | ICD-10-CM | POA: Diagnosis not present

## 2014-09-28 ENCOUNTER — Encounter: Payer: Self-pay | Admitting: Neurology

## 2014-09-28 ENCOUNTER — Ambulatory Visit (INDEPENDENT_AMBULATORY_CARE_PROVIDER_SITE_OTHER): Payer: Medicare Other | Admitting: Neurology

## 2014-09-28 VITALS — BP 108/68 | HR 72 | Ht 62.0 in | Wt 142.0 lb

## 2014-09-28 DIAGNOSIS — Z9889 Other specified postprocedural states: Secondary | ICD-10-CM

## 2014-09-28 DIAGNOSIS — Z9689 Presence of other specified functional implants: Secondary | ICD-10-CM

## 2014-09-28 DIAGNOSIS — G43701 Chronic migraine without aura, not intractable, with status migrainosus: Secondary | ICD-10-CM | POA: Diagnosis not present

## 2014-09-28 DIAGNOSIS — G250 Essential tremor: Secondary | ICD-10-CM | POA: Diagnosis not present

## 2014-09-28 NOTE — Progress Notes (Signed)
Subjective:   Beth Young was seen in consultation in the movement disorder clinic at the request of her PCP at [facility].  The evaluation is for tremor.  She is accompanied by her sister who supplements the history.  The records that were made available to me were reviewed.  I reviewed her [facility] records extensively.  The patient has a history of essential tremor with symptoms since 2009.  She has a strong family history of essential tremor, with her sister also having a DBS placed for essential tremor.  The patient has been on medications in the past for essential tremor including Topamax, propranolol, primidone and gabapentin.  She underwent bilateral VIM DBS at [facility] on 03/23/2012 for essential tremor.  She subsequently had generators placed into the chest and a few months later had the generator replaced in the abdomen because she was complaining about pain and mobility of the generator in the chest.  Over the course of time, the patient has had several episodes that have been investigated by [facility].  She has had ataxic gait and stuttering speech as well as changes in the speech that did not appear to be related to the DBS device.  She had a CT of the brain that did not demonstrate any new infarcts.  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  This is dated 09/05/2013.  There was also evidence of fibromuscular dysplasia in the bilateral high cervical internal carotid arteries.  The patient has also had shaking episodes that did not appear to be tremor.  These were captured on EEG and not felt to be epileptiform.  The patient is on Requip and calls this her "Parkinson's medication" according to [facility] (didn't do that here) although there has been no evidence that she has Parkinson's disease.  She uses that for RLS.  She states that it doesn't help but when I ask her in detail, it is a  cramping of the legs and feet and it occurs during the days and night.  She doesn't describe RLS sx's today.  She also c/o headache.  They are left sided (whole L sided).  They are throbbing and cramping.  She will have nausea and sometimes emesis and sometimes the emesis will come before the headache.  She has no photophobia or phonophobia.  She has had these since she had the surgery.  She didn't consider herself a headachy person prior to the surgery.  She has them bad one day a week but it is always there and dull the other days a week.  She has not been on a preventative headache drug (the preventative tremor drugs that could be used for headache were d/c prior to the surgery).  05/30/14 update:  The patient is seen today, accompanied by her sister who supplements the history.  Patient has a history of essential tremor.  She is status post deep brain stimulator placement on 03/23/2012 at [facility].  I reviewed records since last visit.  She went to [facility] as [facility] had reported that she had fibromuscular dysplasia.  She saw a vascular specialist in this field at [facility].  There was no evidence on his examination of fibromuscular dysplasia.  She had a carotid ultrasound that demonstrated 40-50% stenosis in the left internal carotid artery and normal in the right internal carotid artery.  Her renal duplex was negative.  She had  a normal CRP and normal CMP.  However, he felt that she had uncontrolled HTN that should be followed up on.   Therefore, her metoprolol was increased and she began to feel bad.  Her BP dropped and she began to have falls.  She went to the ER where BP was low and losartan was d/c.   She states that she had emesis in the ER but had "my headaches."  She states that the zanaflex isn't really helping the headaches but may be helping sleep some.  Pt states that headaches are in the left occiput and are sharp in quality.  She can have emesis one day a week with them but headaches are daily.  She  sees black spots, like "gnats" in the visual field with them.  No photophobia.  No phonophobia.  Had them the last several years.  Has not been checking BS.   She states that they told her in the hospital that her parkinsons may be getting worse.  I explained to her that she doesn't have parkinsons, but she was appararently was told by her previous PCP that she had PD.  I noted this same comment previously in her [facility] records.  She remains on requip but she states that it isn't helping the cramping in the legs at night.  She describes no sx's c/w RLS.  She does state that after her BP dropped and she was in the hospital, her stuttering speech came back; "it happens every time."  08/11/14 update:  Pt requested work in appt today.  Went to [facility] hospital 2 days ago and was released yesterday.  I don't have records.  She had acute onset of dizziness, bp elevation and emesis.  She states that her BP at home was 183/53 and was in the 190's when she went to her PCP.  She states that after her BP was elevated she got a bad headache.  They apparently did a head CT that was negative.  She did bring in labs including cmp and UA that was normal.  She was told at the hospital that she couldn't have anything for the headache because she got botox.  She was told this by a neurologist through telemedicine at [facility].  She states that she had previously been doing great with the botox.  She does state that this was her normal headache but the dizziness was different.  She states that the dizziness is spinning.  It is worse with bending over.  No nausea or emesis since released.  09/28/14 update:  The patient is following up today.   The patient did have an MRI of the brain since last visit.  This was done on 06/15/2014.  I reviewed this.  There was moderate small vessel disease.  There was T2 prolongation around the lead on the left which likely represents gliosis.  The leads are not quite symmetric.  I was able to get ER  records from her June episode at [facility] it is said that she presented with her fourth episode they are of recurrent headache with expressive aphasia.  That was not what she had described to me last visit.  Tele-neurology was contacted to rule out that she was a TPA candidate.  Her expressive aphasia was described as word finding difficulties.  The patient has had many episodes of speech changes in the past.  Her episodes were previously captured on EEG at [facility] and felt to be non-epileptiform in nature.  The patient does have a  history of chronic migraine with aura and received her first series of Botox about 3 months ago and is due for her next series within the next few weeks.  She reports that headaches have been picking up again.  She is supposed to be off requip but isn't sure that she is.  Tremor has been good.  Her sister just had surgery so she has been caregiving.    Current/Previously tried tremor medications: topamax, propranolol, primidone, gabapentin, zanaflex  Current medications that may exacerbate tremor:  n/a  Outside reports reviewed: historical medical records, radiology reports and referral letter/letters.  Allergies  Allergen Reactions  . Bupropion Other (See Comments)  . Codeine Other (See Comments)    Jittery  . Sulfa Antibiotics Other (See Comments)  . Tape Other (See Comments)    Uncoded Allergy. Allergen: BANDAIDS  . Trichlormethiazide Other (See Comments)  . Penicillins Rash  . Silver Rash    Patient has moderate blistering with tegaderm use    Outpatient Encounter Prescriptions as of 09/28/2014  Medication Sig  . aspirin EC 81 MG tablet Take 81 mg by mouth daily.   Marland Kitchen CALCIUM PO Take 1,000 mg by mouth daily.  . Cholecalciferol (VITAMIN D3) 1000 UNITS CAPS Take 1,000 Units by mouth daily.   . Cyanocobalamin 1000 MCG TBCR Take by mouth daily.   Marland Kitchen escitalopram (LEXAPRO) 20 MG tablet Take 20 mg by mouth daily.   . fluticasone (FLONASE) 50 MCG/ACT nasal spray Place  1 spray into the nose daily.   . Ipratropium-Albuterol (COMBIVENT) 20-100 MCG/ACT AERS respimat Inhale 1 puff into the lungs every 6 (six) hours as needed.   . metFORMIN (GLUCOPHAGE-XR) 500 MG 24 hr tablet Take 500 mg by mouth daily with breakfast.   . metoprolol succinate (TOPROL-XL) 50 MG 24 hr tablet Take 50 mg by mouth daily.   . montelukast (SINGULAIR) 10 MG tablet Take 10 mg by mouth at bedtime.   Marland Kitchen omeprazole (PRILOSEC) 20 MG capsule Take 20 mg by mouth daily. TAKE 1 CAPSULE (20 MG TOTAL) BY MOUTH DAILY.  Marland Kitchen rOPINIRole (REQUIP) 2 MG tablet Take 2 mg by mouth at bedtime.   . rosuvastatin (CRESTOR) 40 MG tablet Take 40 mg by mouth daily.   Marland Kitchen tiZANidine (ZANAFLEX) 4 MG tablet Take 2 tablets (8 mg total) by mouth at bedtime.   No facility-administered encounter medications on file as of 09/28/2014.    Past Medical History  Diagnosis Date  . Diabetes   . Hypertension   . Asthma   . Essential tremor   . Reflux   . Depression   . Anxiety     Past Surgical History  Procedure Laterality Date  . Burr hole w/ stereotactic insertion of dbs leads / intraop microelectrode recording  04/03/2012  . Abdominal hysterectomy    . Exploratory laparotomy    . Ankle surgery Right   . Hand surgery Bilateral   . Cataract extraction, bilateral    . Deep brain stimulator placement      Medtronic ACTIVA PC    History   Social History  . Marital Status: Single    Spouse Name: N/A  . Number of Children: N/A  . Years of Education: N/A   Occupational History  . retired     Manufacturing systems engineer for phones   Social History Main Topics  . Smoking status: Former Smoker    Quit date: 03/10/2012  . Smokeless tobacco: Not on file  . Alcohol Use: No  . Drug Use:  No  . Sexual Activity: Not on file   Other Topics Concern  . Not on file   Social History Narrative    Family Status  Relation Status Death Age  . Mother Alive     dementia  . Father Deceased     MI  . Sister Alive     breast  cancer  . Sister Alive     ET  . Sister Alive     heart disease  . Brother Alive     ET  . Son Alive     healthy    Review of Systems A complete 10 system ROS was obtained and was negative apart from what is mentioned.   Objective:   VITALS:   Filed Vitals:   09/28/14 0737  BP: 108/68  Pulse: 72  Height: 5\' 2"  (1.575 m)  Weight: 142 lb (64.411 kg)   Wt Readings from Last 3 Encounters:  09/28/14 142 lb (64.411 kg)  08/11/14 147 lb (66.679 kg)  06/15/14 148 lb (67.132 kg)    Gen:  Appears stated age and in NAD. HEENT:  Normocephalic, atraumatic. The mucous membranes are moist. The superficial temporal arteries are without ropiness or tenderness. Cardiovascular: Regular rate and rhythm. Lungs: Clear to auscultation bilaterally. Neck: There are no carotid bruits noted bilaterally.  NEUROLOGICAL:  Orientation:  The patient is alert and oriented x 3.   Cranial nerves: There is good facial symmetry. The pupils are equal round and reactive to light bilaterally. Fundoscopic exam reveals clear disc margins bilaterally. Extraocular muscles are intact and visual fields are full to confrontational testing. Speech is normal today, without stuttering quality. Tone: Tone is good throughout.  No rigidity Sensation: Sensation is intact to light touch throughout Coordination:  The patient has no dysdiadichokinesia.  She has no difficulty with heel-to-shin.  She does have some difficulty with finger-nose-finger. Motor: Strength is 5/5 in the bilateral upper and lower extremities.  Grip strength is equal bilaterally, but it is decreased.  Shoulder shrug is equal bilaterally.  There is no pronator drift.  There are no fasciculations noted. Gait and Station: The patient ambulates well today  MOVEMENT EXAM: Tremor:  There is no tremor today      Assessment/Plan:   1.  Essential Tremor.  -She is status post bilateral VIM DBS at [facility] on 03/23/2012 with revision of the generator into the  abdomen.  -Reminded her that she needs to make sure that she is off of Requip.  She does not need this medication.  She is going to go home and check today.  2.  Hx of episodic speech changes  -Investigated at [facility] and worked up for stroke.  Does not seem to be related to the DBS.  In fact, turning off DBS makes speech worse.  -suspect psychogenic based on examination.    -MRI of the brain on 06/15/2014 did not demonstrate any evidence of new infarction, but did demonstrate moderate small vessel disease.  She will remain on aspirin.  -send to ST  3.  Hx of episodic "shaking episodes" unrelated to tremor  -captured on EEG at [facility] and felt to be non-epileptogenic   4.  Possible fibromuscular dysplasia  -She saw a vascular specialist in this field at [facility].  There was no evidence on his examination of fibromuscular dysplasia.  5.  Leg cramping.  -Better with tonic water  -As above, not sure if she is off Requip which was was being used for RLS and has no  sx's c/w RLS.  Explained to patient in detail that she doesn' have PD as she kept referring to her parkinsons disease.  No evidence of parkinsons disease.  6.  Chronic migraine with aura  -Doing better with Botox therapy.  Headaches have come back as Botox is wearing off.  She is due for reinjection on August 5.  7.  I will see her back in the next few months for follow-up and on August 5 for Botox.

## 2014-09-28 NOTE — Procedures (Signed)
DBS Programming was performed.    Total time spent programming was 25 minutes.  Details on separate neurophys worksheet.   Device was confirmed to be on.  Soft start was confirmed to be on at 8.  Impedences were checked and were within normal limits.  Battery was checked and was determined to be functioning normally and not near the end of life.  Final settings were as follows:  Left brain electrode:     2-1+           ; Amplitude  2.7   V   ; Pulse width 90 microseconds;   Frequency   150   Hz.  Right brain electrode:     10-9+          ; Amplitude   2.7  V ;  Pulse width 90  microseconds;  Frequency   150    Hz.

## 2014-09-28 NOTE — Patient Instructions (Signed)
1.  Check when you get home to see if you are off the ropinirole (requip)

## 2014-10-13 ENCOUNTER — Ambulatory Visit (INDEPENDENT_AMBULATORY_CARE_PROVIDER_SITE_OTHER): Payer: Medicare Other | Admitting: Neurology

## 2014-10-13 ENCOUNTER — Encounter: Payer: Self-pay | Admitting: Neurology

## 2014-10-13 VITALS — Temp 98.0°F

## 2014-10-13 DIAGNOSIS — G43701 Chronic migraine without aura, not intractable, with status migrainosus: Secondary | ICD-10-CM

## 2014-10-13 MED ORDER — ONABOTULINUMTOXINA 100 UNITS IJ SOLR
100.0000 [IU] | Freq: Once | INTRAMUSCULAR | Status: AC
  Administered 2014-10-13: 100 [IU] via INTRAMUSCULAR

## 2014-10-13 NOTE — Procedures (Signed)
Botulinum Clinic   Procedure Note Botox  Attending: Dr. Lurena Joiner Ajit Errico  Preoperative Diagnosis(es): chronic migraine    Consent obtained from: The patient Benefits discussed included, but were not limited to ability to open eyes and therefore see better.  Risk discussed included, but were not limited pain and discomfort, bleeding, bruising, excessive weakness, venous thrombosis, muscle atrophy and drooping of eyelids.  A copy of the patient medication guide was given to the patient which explains the blackbox warning.  Patients identity and treatment sites confirmed Yes.  .  Details of Procedure: Skin was cleaned with alcohol.  A 30 gauge, 1/2 inch needle was introduced to the target muscle.  Prior to injection, the needle plunger was aspirated with each injection to make sure the needle was not within a blood vessel.  There was no blood retrieved on aspiration.    Following is a summary of the muscles injected  And the amount of Botulinum toxin used:   Dilution 0.9% preservative free saline mixed with 100 u Botox type A to make 5 U per 0.1cc (2 cc of saline used per 100 U botox)  Injections  Right frontalis: 5 units + 5 units for a total of 10 units Left frontalis:  5 units + 5 units for a total of 10 units Right corrugator: 2.5 units + 2.5 units for a total of 5 units Left corrugator:  2.5 units + 2.5 units for a total of 5 units Procereus:  5 units Left temporalis:  5 units + 5 units for a total of 10 units Right temporalis:  5 units + 5 units for a total of 10 units Right cervical paraspinals:  5 units + 5 units for a total of 10 units Left cervical paraspinals:  5 units + 5 units for a total of 10 units Right occiput:  5 units + 5 units for a total of 10 units Left occiput:  5 units + 5 units for a total of 10 units --------------------------------------------------------------------------------------  A total of 95 units is injected.  5 units is wasted.  Agent: Botulinum  Type A ( Onobotulinum Toxin type A ).  1 vials of Botox were used, each containing 50 units and freshly diluted with 2 mL of sterile, non-perserved saline   Total injected (Units): 95  Total wasted (Units): 5   Pt tolerated procedure well without complications.   Reinjection is anticipated in 3 months.  Pt given migraine diary to keep.

## 2014-11-07 ENCOUNTER — Telehealth: Payer: Self-pay | Admitting: *Deleted

## 2014-11-07 MED ORDER — METHYLPREDNISOLONE 4 MG PO TBPK: ORAL_TABLET | ORAL | Status: DC

## 2014-11-07 NOTE — Telephone Encounter (Signed)
Spoke with patient she states after Botox injection on 10/13/2014 she was in bed for three days with severe headache. She states she has had a headache all day every day since. She has tried ibuprofen but this hasn't helped. She had one episode of vomiting today. Please advise.

## 2014-11-07 NOTE — Telephone Encounter (Signed)
That is unusual.  Give RX for medrol and see if helps.  Tell her r/b/se of steroids.

## 2014-11-07 NOTE — Telephone Encounter (Signed)
Patient made aware. Medrol dose pack sent to pharmacy.

## 2014-11-07 NOTE — Addendum Note (Signed)
Addended bySilvio Pate on: 11/07/2014 03:02 PM   Modules accepted: Orders

## 2014-11-07 NOTE — Telephone Encounter (Signed)
Patient called to let you know that she has had a headache and been throwing up since her botox injection.  She would lik for someone to call her back please.  [phone]

## 2014-11-09 ENCOUNTER — Telehealth: Payer: Self-pay | Admitting: Neurology

## 2014-11-09 ENCOUNTER — Ambulatory Visit (INDEPENDENT_AMBULATORY_CARE_PROVIDER_SITE_OTHER): Payer: Medicare Other | Admitting: *Deleted

## 2014-11-09 DIAGNOSIS — R519 Headache, unspecified: Secondary | ICD-10-CM

## 2014-11-09 DIAGNOSIS — R51 Headache: Secondary | ICD-10-CM | POA: Diagnosis not present

## 2014-11-09 MED ORDER — KETOROLAC TROMETHAMINE 60 MG/2ML IM SOLN
60.0000 mg | Freq: Once | INTRAMUSCULAR | Status: AC
  Administered 2014-11-09: 60 mg via INTRAMUSCULAR

## 2014-11-09 NOTE — Telephone Encounter (Signed)
Pt called and stated she is having really bad headaches as to where she can't even lay down/Dawn CB# [phone]

## 2014-11-09 NOTE — Telephone Encounter (Signed)
Patient started on medrol dose pack two days ago. Please advise.

## 2014-11-09 NOTE — Telephone Encounter (Signed)
Spoke with patient who states she would like the injection done. Appt scheduled.

## 2014-11-09 NOTE — Progress Notes (Signed)
Patient in for a toradol injection.

## 2014-11-09 NOTE — Telephone Encounter (Signed)
If she wants, she can come for toradol injection as long as no allergy.  She is going to have to give the medrol time to work though as she is only on day 3 today

## 2014-11-10 ENCOUNTER — Ambulatory Visit: Payer: Medicare Other

## 2014-11-24 ENCOUNTER — Telehealth: Payer: Self-pay | Admitting: Neurology

## 2014-11-24 MED ORDER — NORTRIPTYLINE HCL 10 MG PO CAPS: 10.0000 mg | ORAL_CAPSULE | Freq: Every day | ORAL | Status: DC

## 2014-11-24 NOTE — Telephone Encounter (Signed)
Nortriptyline RX sent to pharmacy. Patient made aware.

## 2014-11-24 NOTE — Telephone Encounter (Signed)
Had Botox injection 10/13/2014. Had toradol injection on 11/09/2014. Started Medrol dose back on 11/07/2014. On Zanaflex regularly. Please advise.

## 2014-11-24 NOTE — Telephone Encounter (Addendum)
Start nortriptyline  at bedtime as preventative therapy.  Side effects include lightheadedness and sleepiness, but this is a low dose.  OK to take with Zanaflex for now.

## 2014-11-24 NOTE — Telephone Encounter (Signed)
PT called and said she is still having headaches/Dawn CB# [phone]

## 2014-11-30 DIAGNOSIS — I10 Essential (primary) hypertension: Secondary | ICD-10-CM | POA: Diagnosis not present

## 2014-11-30 DIAGNOSIS — E782 Mixed hyperlipidemia: Secondary | ICD-10-CM | POA: Diagnosis not present

## 2014-11-30 DIAGNOSIS — E119 Type 2 diabetes mellitus without complications: Secondary | ICD-10-CM | POA: Diagnosis not present

## 2014-11-30 DIAGNOSIS — K219 Gastro-esophageal reflux disease without esophagitis: Secondary | ICD-10-CM | POA: Diagnosis not present

## 2014-12-07 DIAGNOSIS — G4762 Sleep related leg cramps: Secondary | ICD-10-CM | POA: Diagnosis not present

## 2014-12-07 DIAGNOSIS — Z23 Encounter for immunization: Secondary | ICD-10-CM | POA: Diagnosis not present

## 2014-12-07 DIAGNOSIS — M810 Age-related osteoporosis without current pathological fracture: Secondary | ICD-10-CM | POA: Diagnosis not present

## 2014-12-07 DIAGNOSIS — I10 Essential (primary) hypertension: Secondary | ICD-10-CM | POA: Diagnosis not present

## 2014-12-07 DIAGNOSIS — E119 Type 2 diabetes mellitus without complications: Secondary | ICD-10-CM | POA: Diagnosis not present

## 2014-12-29 ENCOUNTER — Encounter: Payer: Self-pay | Admitting: Neurology

## 2014-12-29 ENCOUNTER — Ambulatory Visit (INDEPENDENT_AMBULATORY_CARE_PROVIDER_SITE_OTHER): Payer: Medicare Other | Admitting: Neurology

## 2014-12-29 VITALS — BP 152/78 | HR 95 | Ht 62.0 in | Wt 135.0 lb

## 2014-12-29 DIAGNOSIS — G250 Essential tremor: Secondary | ICD-10-CM | POA: Diagnosis not present

## 2014-12-29 DIAGNOSIS — Z9689 Presence of other specified functional implants: Secondary | ICD-10-CM

## 2014-12-29 DIAGNOSIS — IMO0002 Reserved for concepts with insufficient information to code with codable children: Secondary | ICD-10-CM

## 2014-12-29 DIAGNOSIS — G43709 Chronic migraine without aura, not intractable, without status migrainosus: Secondary | ICD-10-CM

## 2014-12-29 MED ORDER — NORTRIPTYLINE HCL 25 MG PO CAPS: 50.0000 mg | ORAL_CAPSULE | Freq: Every day | ORAL | Status: DC

## 2014-12-29 NOTE — Procedures (Signed)
DBS Programming was performed.    Total time spent programming was 25 minutes.  Details on separate neurophys worksheet.   Device was confirmed to be on.  Soft start was confirmed to be on at 8.  Impedences were checked and were within normal limits.  Battery was checked and was determined to be functioning normally and not near the end of life.  Final settings were as follows:  Left brain electrode:     2-1+           ; Amplitude  2.9   V   ; Pulse width 90 microseconds;   Frequency   150   Hz.  Right brain electrode:     10-9+          ; Amplitude   2.9  V ;  Pulse width 90  microseconds;  Frequency   150    Hz.

## 2014-12-29 NOTE — Progress Notes (Signed)
Subjective:   Beth Young was seen in consultation in the movement disorder clinic at the request of her PCP at [facility].  The evaluation is for tremor.  She is accompanied by her sister who supplements the history.  The records that were made available to me were reviewed.  I reviewed her [facility] records extensively.  The patient has a history of essential tremor with symptoms since 2009.  She has a strong family history of essential tremor, with her sister also having a DBS placed for essential tremor.  The patient has been on medications in the past for essential tremor including Topamax, propranolol, primidone and gabapentin.  She underwent bilateral VIM DBS at [facility] on 03/23/2012 for essential tremor.  She subsequently had generators placed into the chest and a few months later had the generator replaced in the abdomen because she was complaining about pain and mobility of the generator in the chest.  Over the course of time, the patient has had several episodes that have been investigated by [facility].  She has had ataxic gait and stuttering speech as well as changes in the speech that did not appear to be related to the DBS device.  She had a CT of the brain that did not demonstrate any new infarcts.  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  This is dated 09/05/2013.  There was also evidence of fibromuscular dysplasia in the bilateral high cervical internal carotid arteries.  The patient has also had shaking episodes that did not appear to be tremor.  These were captured on EEG and not felt to be epileptiform.  The patient is on Requip and calls this her "Parkinson's medication" according to [facility] (didn't do that here) although there has been no evidence that she has Parkinson's disease.  She uses that for RLS.  She states that it doesn't help but when I ask her in detail, it is a  cramping of the legs and feet and it occurs during the days and night.  She doesn't describe RLS sx's today.  She also c/o headache.  They are left sided (whole L sided).  They are throbbing and cramping.  She will have nausea and sometimes emesis and sometimes the emesis will come before the headache.  She has no photophobia or phonophobia.  She has had these since she had the surgery.  She didn't consider herself a headachy person prior to the surgery.  She has them bad one day a week but it is always there and dull the other days a week.  She has not been on a preventative headache drug (the preventative tremor drugs that could be used for headache were d/c prior to the surgery).  05/30/14 update:  The patient is seen today, accompanied by her sister who supplements the history.  Patient has a history of essential tremor.  She is status post deep brain stimulator placement on 03/23/2012 at [facility].  I reviewed records since last visit.  She went to [facility] as [facility] had reported that she had fibromuscular dysplasia.  She saw a vascular specialist in this field at [facility].  There was no evidence on his examination of fibromuscular dysplasia.  She had a carotid ultrasound that demonstrated 40-50% stenosis in the left internal carotid artery and normal in the right internal carotid artery.  Her renal duplex was negative.  She had  a normal CRP and normal CMP.  However, he felt that she had uncontrolled HTN that should be followed up on.   Therefore, her metoprolol was increased and she began to feel bad.  Her BP dropped and she began to have falls.  She went to the ER where BP was low and losartan was d/c.   She states that she had emesis in the ER but had "my headaches."  She states that the zanaflex isn't really helping the headaches but may be helping sleep some.  Pt states that headaches are in the left occiput and are sharp in quality.  She can have emesis one day a week with them but headaches are daily.  She  sees black spots, like "gnats" in the visual field with them.  No photophobia.  No phonophobia.  Had them the last several years.  Has not been checking BS.   She states that they told her in the hospital that her parkinsons may be getting worse.  I explained to her that she doesn't have parkinsons, but she was appararently was told by her previous PCP that she had PD.  I noted this same comment previously in her [facility] records.  She remains on requip but she states that it isn't helping the cramping in the legs at night.  She describes no sx's c/w RLS.  She does state that after her BP dropped and she was in the hospital, her stuttering speech came back; "it happens every time."  08/11/14 update:  Pt requested work in appt today.  Went to [facility] hospital 2 days ago and was released yesterday.  I don't have records.  She had acute onset of dizziness, bp elevation and emesis.  She states that her BP at home was 183/53 and was in the 190's when she went to her PCP.  She states that after her BP was elevated she got a bad headache.  They apparently did a head CT that was negative.  She did bring in labs including cmp and UA that was normal.  She was told at the hospital that she couldn't have anything for the headache because she got botox.  She was told this by a neurologist through telemedicine at [facility].  She states that she had previously been doing great with the botox.  She does state that this was her normal headache but the dizziness was different.  She states that the dizziness is spinning.  It is worse with bending over.  No nausea or emesis since released.  09/28/14 update:  The patient is following up today.   The patient did have an MRI of the brain since last visit.  This was done on 06/15/2014.  I reviewed this.  There was moderate small vessel disease.  There was T2 prolongation around the lead on the left which likely represents gliosis.  The leads are not quite symmetric.  I was able to get ER  records from her June episode at [facility] it is said that she presented with her fourth episode they are of recurrent headache with expressive aphasia.  That was not what she had described to me last visit.  Tele-neurology was contacted to rule out that she was a TPA candidate.  Her expressive aphasia was described as word finding difficulties.  The patient has had many episodes of speech changes in the past.  Her episodes were previously captured on EEG at [facility] and felt to be non-epileptiform in nature.  The patient does have a  history of chronic migraine with aura and received her first series of Botox about 3 months ago and is due for her next series within the next few weeks.  She reports that headaches have been picking up again.  She is supposed to be off requip but isn't sure that she is.  Tremor has been good.  Her sister just had surgery so she has been caregiving.    12/29/14 update:  The patient is following up today, accompanied by her sister who supplements the history.  The patient has a history of essential tremor, status post deep brain stimulation.  She also has a history of chronic migraine with aura.  This has been the bigger problem as of late.  She underwent her last Botox on 10/13/2014.  She called me on 11/07/2014 NSAID that she had had persistent headache and emesis since her Botox.  She tried both Medrol and Toradol and had some relief, but not complete relief.  She denies any relief with that today but had previously said she got some relief.  She called here again on 11/24/2014.  I was out of the office but my partner started her on Pamelor, 10 mg at night; it caused nightmares initially but they are gone now.  She states that the start at the front of the head and radiate to the back bilaterally.  They are pounding and sometimes stabbing.  They wax and wane in intensity.  Rarely it goes away; it is generally always there.  No new medications.  She remains on Zanaflex for prophylaxis as  well.  She has previously been on topiramate, propranolol, gabapentin as well.  In regards to her essential tremor, last visit I asked her to stop her Requip.  She isn't sure if she is on that or not but thinks that perhaps she d/c it.  Said speech intermittently getting worse with "episodes" that she has had in the past.  Current/Previously tried tremor medications: topamax, propranolol, primidone, gabapentin, zanaflex  Current medications that may exacerbate tremor:  n/a  Outside reports reviewed: historical medical records, radiology reports and referral letter/letters.  Allergies  Allergen Reactions  . Bupropion Other (See Comments)  . Codeine Other (See Comments)    Jittery  . Sulfa Antibiotics Other (See Comments)  . Tape Other (See Comments)    Uncoded Allergy. Allergen: BANDAIDS  . Trichlormethiazide Other (See Comments)  . Penicillins Rash  . Silver Rash    Patient has moderate blistering with tegaderm use    Outpatient Encounter Prescriptions as of 12/29/2014  Medication Sig  . aspirin EC 81 MG tablet Take 81 mg by mouth daily.   Marland Kitchen. CALCIUM PO Take 1,000 mg by mouth daily.  . Cholecalciferol (VITAMIN D3) 1000 UNITS CAPS Take 1,000 Units by mouth daily.   . Cyanocobalamin 1000 MCG TBCR Take by mouth daily.   Marland Kitchen. escitalopram (LEXAPRO) 20 MG tablet Take 20 mg by mouth daily.   . fluticasone (FLONASE) 50 MCG/ACT nasal spray Place 1 spray into the nose daily.   . Ipratropium-Albuterol (COMBIVENT) 20-100 MCG/ACT AERS respimat Inhale 1 puff into the lungs every 6 (six) hours as needed.   . metFORMIN (GLUCOPHAGE-XR) 500 MG 24 hr tablet Take 500 mg by mouth daily with breakfast.   . metoprolol succinate (TOPROL-XL) 50 MG 24 hr tablet Take 50 mg by mouth daily.   . montelukast (SINGULAIR) 10 MG tablet Take 10 mg by mouth at bedtime.   Marland Kitchen. omeprazole (PRILOSEC) 20 MG capsule Take 20 mg  by mouth daily. TAKE 1 CAPSULE (20 MG TOTAL) BY MOUTH DAILY.  Marland Kitchen rOPINIRole (REQUIP) 2 MG tablet Take 2  mg by mouth at bedtime.   . rosuvastatin (CRESTOR) 40 MG tablet Take 40 mg by mouth daily.   Marland Kitchen tiZANidine (ZANAFLEX) 4 MG tablet Take 2 tablets (8 mg total) by mouth at bedtime.  . [DISCONTINUED] nortriptyline (PAMELOR) 10 MG capsule Take 1 capsule (10 mg total) by mouth at bedtime.  . nortriptyline (PAMELOR) 25 MG capsule Take 2 capsules (50 mg total) by mouth at bedtime.  . [DISCONTINUED] methylPREDNISolone (MEDROL DOSEPAK) 4 MG TBPK tablet Take as directed   No facility-administered encounter medications on file as of 12/29/2014.    Past Medical History  Diagnosis Date  . Diabetes (HCC)   . Hypertension   . Asthma   . Essential tremor   . Reflux   . Depression   . Anxiety     Past Surgical History  Procedure Laterality Date  . Burr hole w/ stereotactic insertion of dbs leads / intraop microelectrode recording  04/03/2012  . Abdominal hysterectomy    . Exploratory laparotomy    . Ankle surgery Right   . Hand surgery Bilateral   . Cataract extraction, bilateral    . Deep brain stimulator placement      Medtronic ACTIVA PC    Social History   Social History  . Marital Status: Single    Spouse Name: N/A  . Number of Children: N/A  . Years of Education: N/A   Occupational History  . retired     Manufacturing systems engineer for phones   Social History Main Topics  . Smoking status: Former Smoker    Quit date: 03/10/2012  . Smokeless tobacco: Not on file  . Alcohol Use: No  . Drug Use: No  . Sexual Activity: Not on file   Other Topics Concern  . Not on file   Social History Narrative    Family Status  Relation Status Death Age  . Mother Alive     dementia  . Father Deceased     MI  . Sister Alive     breast cancer  . Sister Alive     ET  . Sister Alive     heart disease  . Brother Alive     ET  . Son Alive     healthy    Review of Systems A complete 10 system ROS was obtained and was negative apart from what is mentioned.   Objective:   VITALS:     Filed Vitals:   12/29/14 0734  BP: 152/78  Pulse: 95  Height: 5\' 2"  (1.575 m)  Weight: 135 lb (61.236 kg)   Wt Readings from Last 3 Encounters:  12/29/14 135 lb (61.236 kg)  09/28/14 142 lb (64.411 kg)  08/11/14 147 lb (66.679 kg)    Gen:  Appears stated age and in NAD. HEENT:  Normocephalic, atraumatic. The mucous membranes are moist. The superficial temporal arteries are without ropiness or tenderness. Cardiovascular: Regular rate and rhythm. Lungs: Clear to auscultation bilaterally. Neck: There are no carotid bruits noted bilaterally.  NEUROLOGICAL:  Orientation:  The patient is alert and oriented x 3.   Cranial nerves: There is good facial symmetry. The pupils are equal round and reactive to light bilaterally. Fundoscopic exam reveals clear disc margins bilaterally. Extraocular muscles are intact and visual fields are full to confrontational testing. Speech is normal today, without stuttering quality. Tone: Tone is good throughout.  No rigidity Sensation: Sensation is intact to light touch throughout Coordination:  The patient has no dysdiadichokinesia.  She has no difficulty with heel-to-shin.  She does have some difficulty with finger-nose-finger but more secondary to ataxia Motor: Strength is at least 5-/5 in the UE/LE; there is give way weakness.  Grip strength is equal bilaterally, but it is decreased.  Shoulder shrug is equal bilaterally.  There is no pronator drift.  There are no fasciculations noted. Gait and Station: The patient ambulates well today  MOVEMENT EXAM: Tremor:  There is no tremor of the outstretched hands.  There is some ataxia with FNF but no real tremor      Assessment/Plan:   1.  Essential Tremor.  -She is status post bilateral VIM DBS at [facility] on 03/23/2012 with revision of the generator into the abdomen.  -Reminded her that she needs to make sure that she is off of Requip.  She does not need this medication.  She is going to go home and check  today.  I have reminded her of this the last several visits.  -programmed her stimulator today.  2.  Hx of episodic speech changes  -Investigated at [facility] and worked up for stroke.  Does not seem to be related to the DBS.  In fact, turning off DBS makes speech worse.  -suspect psychogenic based on examination.  Long discussion about this today.  Told her that I think that she needs a counselor as think that she manifests stress in somatic way.  -MRI of the brain on 06/15/2014 did not demonstrate any evidence of new infarction, but did demonstrate moderate small vessel disease.  She will remain on aspirin.  3.  Hx of episodic "shaking episodes" unrelated to tremor  -captured on EEG at [facility] and felt to be non-epileptogenic   4.  Possible fibromuscular dysplasia  -She saw a vascular specialist in this field at [facility].  There was no evidence on his examination of fibromuscular dysplasia.  5.  Leg cramping.  -Better with tonic water  -As above, not sure if she is off Requip which was was being used for RLS and has no sx's c/w RLS.  Explained to patient in detail that she doesn' have PD as she kept referring to her parkinsons disease.  No evidence of parkinsons disease.  6.  Chronic migraine with aura  -Had more headaches this last round after botox.  Think that needs counselor, as above.  Will cautiously increase pamelor slowly to 50 mg q hs.  Talked to her about possible anticholinergic SE.  -encouraged to continue botox for now  7.  I will see her back next month for botox.  Much greater than 50% of this visit was spent in counseling with the patient and the family.  Total face to face time:  30 min, not including programming time

## 2014-12-29 NOTE — Patient Instructions (Addendum)
1. Take Nortriptyline 10 mg - two tablets at night for a week. Then start Nortriptyline 25 mg - 1 tablet at night for one week then increase to two tablets at night.  2. Make sure you are not taking Requip.

## 2015-01-11 ENCOUNTER — Telehealth: Payer: Self-pay | Admitting: Neurology

## 2015-01-11 MED ORDER — ZONISAMIDE 100 MG PO CAPS: ORAL_CAPSULE | ORAL | Status: DC

## 2015-01-11 NOTE — Telephone Encounter (Signed)
Will you find out what her sulfa allergy is?

## 2015-01-11 NOTE — Telephone Encounter (Signed)
Patient states Pamelor is giving her nightmares. Please advise.

## 2015-01-11 NOTE — Telephone Encounter (Signed)
Spoke with patient. She states he had reaction as a small child and doesn't remember.

## 2015-01-11 NOTE — Telephone Encounter (Signed)
Lets try zonegran then.  The cross reactivity chance is low anyway.  zonegran - 100 mg q hs for 2 weeks and then 2 po q hs

## 2015-01-11 NOTE — Telephone Encounter (Signed)
RX sent to pharmacy. Patient made aware.  

## 2015-01-19 ENCOUNTER — Ambulatory Visit (INDEPENDENT_AMBULATORY_CARE_PROVIDER_SITE_OTHER): Payer: Medicare Other | Admitting: Neurology

## 2015-01-19 VITALS — Temp 97.7°F

## 2015-01-19 DIAGNOSIS — G43701 Chronic migraine without aura, not intractable, with status migrainosus: Secondary | ICD-10-CM

## 2015-01-19 MED ORDER — ONABOTULINUMTOXINA 100 UNITS IJ SOLR
100.0000 [IU] | Freq: Once | INTRAMUSCULAR | Status: AC
  Administered 2015-01-19: 100 [IU] via INTRAMUSCULAR

## 2015-01-19 NOTE — Procedures (Signed)
Bot[facility]um [facility]   Procedure Note Botox  Attending: Dr. Lurena Joinerebecca Lesette Frary  Preoperative Diagnosis(es): chronic migraine    Consent obtained from: The patient Benefits discussed included, but were not limited to ability to open eyes and therefore see better.  Risk discussed included, but were not limited pain and discomfort, bleeding, bruising, excessive weakness, venous thrombosis, muscle atrophy and drooping of eyelids.  A copy of the patient medication guide was given to the patient which explains the blackbox warning.  Patients identity and treatment sites confirmed Yes.  .  Details of Procedure: Skin was cleaned with alcohol.  A 30 gauge, 1/2 inch needle was introduced to the target muscle.  Prior to injection, the needle plunger was aspirated with each injection to make sure the needle was not within a blood vessel.  There was no blood retrieved on aspiration.    Following is a summary of the muscles injected  And the amount of Botulinum toxin used:   Dilution 0.9% preservative free saline mixed with 100 u Botox type A to make 5 U per 0.1cc (2 cc of saline used per 100 U botox)  Injections  Right frontalis: 5 units + 5 units for a total of 10 units Left frontalis:  5 units + 5 units for a total of 10 units Right corrugator: 2.5 units + 2.5 units for a total of 5 units Left corrugator:  2.5 units + 2.5 units for a total of 5 units Procereus:  5 units Left temporalis:  5 units + 5 units for a total of 10 units Right temporalis:  5 units + 5 units for a total of 10 units Right cervical paraspinals:  5 units + 5 units for a total of 10 units Left cervical paraspinals:  5 units + 5 units for a total of 10 units Right occiput:  5 units + 5 units for a total of 10 units Left occiput:  5 units + 5 units for a total of 10 units --------------------------------------------------------------------------------------  A total of 95 units is injected.  5 units is wasted.  Agent: Botulinum  Type A ( Onobotulinum Toxin type A ).  1 vials of Botox were used, each containing 50 units and freshly diluted with 2 mL of sterile, non-perserved saline   Total injected (Units): 95  Total wasted (Units): 5   Pt tolerated procedure well without complications.   Reinjection is anticipated in 3 months.

## 2015-02-12 ENCOUNTER — Other Ambulatory Visit: Payer: Self-pay | Admitting: Neurology

## 2015-02-12 NOTE — Telephone Encounter (Signed)
Please advise 

## 2015-02-23 DIAGNOSIS — I10 Essential (primary) hypertension: Secondary | ICD-10-CM | POA: Diagnosis not present

## 2015-02-23 DIAGNOSIS — E782 Mixed hyperlipidemia: Secondary | ICD-10-CM | POA: Diagnosis not present

## 2015-02-23 DIAGNOSIS — E119 Type 2 diabetes mellitus without complications: Secondary | ICD-10-CM | POA: Diagnosis not present

## 2015-02-26 DIAGNOSIS — G4762 Sleep related leg cramps: Secondary | ICD-10-CM | POA: Insufficient documentation

## 2015-02-27 DIAGNOSIS — E119 Type 2 diabetes mellitus without complications: Secondary | ICD-10-CM | POA: Diagnosis not present

## 2015-02-27 DIAGNOSIS — M810 Age-related osteoporosis without current pathological fracture: Secondary | ICD-10-CM | POA: Diagnosis not present

## 2015-02-27 DIAGNOSIS — M25532 Pain in left wrist: Secondary | ICD-10-CM | POA: Diagnosis not present

## 2015-02-27 DIAGNOSIS — I10 Essential (primary) hypertension: Secondary | ICD-10-CM | POA: Diagnosis not present

## 2015-02-27 DIAGNOSIS — G4762 Sleep related leg cramps: Secondary | ICD-10-CM | POA: Diagnosis not present

## 2015-03-01 DIAGNOSIS — Z9889 Other specified postprocedural states: Secondary | ICD-10-CM | POA: Diagnosis not present

## 2015-03-01 DIAGNOSIS — M25532 Pain in left wrist: Secondary | ICD-10-CM | POA: Diagnosis not present

## 2015-03-01 DIAGNOSIS — R937 Abnormal findings on diagnostic imaging of other parts of musculoskeletal system: Secondary | ICD-10-CM | POA: Diagnosis not present

## 2015-03-13 DIAGNOSIS — M25539 Pain in unspecified wrist: Secondary | ICD-10-CM | POA: Insufficient documentation

## 2015-03-13 DIAGNOSIS — M19239 Secondary osteoarthritis, unspecified wrist: Secondary | ICD-10-CM | POA: Insufficient documentation

## 2015-03-14 DIAGNOSIS — M25532 Pain in left wrist: Secondary | ICD-10-CM | POA: Diagnosis not present

## 2015-03-14 DIAGNOSIS — M19132 Post-traumatic osteoarthritis, left wrist: Secondary | ICD-10-CM | POA: Diagnosis not present

## 2015-03-14 DIAGNOSIS — Z1389 Encounter for screening for other disorder: Secondary | ICD-10-CM | POA: Diagnosis not present

## 2015-03-16 ENCOUNTER — Other Ambulatory Visit: Payer: Self-pay | Admitting: Neurology

## 2015-03-16 NOTE — Telephone Encounter (Signed)
Thought that she was off this when went on pamelor?

## 2015-03-16 NOTE — Telephone Encounter (Signed)
Please advise 

## 2015-03-16 NOTE — Telephone Encounter (Signed)
Left message on machine for patient to call back.

## 2015-03-18 ENCOUNTER — Other Ambulatory Visit: Payer: Self-pay | Admitting: Neurology

## 2015-03-27 DIAGNOSIS — M1812 Unilateral primary osteoarthritis of first carpometacarpal joint, left hand: Secondary | ICD-10-CM | POA: Diagnosis not present

## 2015-03-27 DIAGNOSIS — M25532 Pain in left wrist: Secondary | ICD-10-CM | POA: Diagnosis not present

## 2015-03-27 DIAGNOSIS — M7712 Lateral epicondylitis, left elbow: Secondary | ICD-10-CM | POA: Diagnosis not present

## 2015-03-30 ENCOUNTER — Telehealth: Payer: Self-pay | Admitting: Neurology

## 2015-03-30 NOTE — Telephone Encounter (Signed)
Spoke with pharmacy and they state last time patient had Requip filled was 05/18/2014.

## 2015-03-30 NOTE — Telephone Encounter (Signed)
-----   Message from Octaviano Batty Tat, DO sent at 03/30/2015  8:31 AM EST ----- Will you call her pharmacy and make sure off requip

## 2015-04-02 ENCOUNTER — Ambulatory Visit (INDEPENDENT_AMBULATORY_CARE_PROVIDER_SITE_OTHER): Payer: Medicare Other | Admitting: Neurology

## 2015-04-02 ENCOUNTER — Encounter: Payer: Self-pay | Admitting: Neurology

## 2015-04-02 VITALS — BP 110/60 | HR 76 | Ht 62.0 in | Wt 127.0 lb

## 2015-04-02 DIAGNOSIS — G250 Essential tremor: Secondary | ICD-10-CM

## 2015-04-02 DIAGNOSIS — Z9689 Presence of other specified functional implants: Secondary | ICD-10-CM | POA: Diagnosis not present

## 2015-04-02 DIAGNOSIS — G43701 Chronic migraine without aura, not intractable, with status migrainosus: Secondary | ICD-10-CM

## 2015-04-02 NOTE — Procedures (Signed)
DBS Programming was performed.    Total time spent programming was 25 minutes.  Details on separate neurophys worksheet.   Device was confirmed to be on.  Soft start was confirmed to be on at 8.  Impedences were checked and were within normal limits.  Battery was checked and was determined to be functioning normally and not near the end of life.  Final settings were as follows:  Left brain electrode:     2-1+           ; Amplitude  2.9   V   ; Pulse width 90 microseconds;   Frequency   140   Hz.  Right brain electrode:     10-9+          ; Amplitude   2.9  V ;  Pulse width 90  microseconds;  Frequency   140    Hz.

## 2015-04-02 NOTE — Progress Notes (Signed)
Subjective:   Beth Young was seen in consultation in the movement disorder clinic at the request of her PCP at [facility].  The evaluation is for tremor.  She is accompanied by her sister who supplements the history.  The records that were made available to me were reviewed.  I reviewed her [facility] records extensively.  The patient has a history of essential tremor with symptoms since 2009.  She has a strong family history of essential tremor, with her sister also having a DBS placed for essential tremor.  The patient has been on medications in the past for essential tremor including Topamax, propranolol, primidone and gabapentin.  She underwent bilateral VIM DBS at [facility] on 03/23/2012 for essential tremor.  She subsequently had generators placed into the chest and a few months later had the generator replaced in the abdomen because she was complaining about pain and mobility of the generator in the chest.  Over the course of time, the patient has had several episodes that have been investigated by [facility].  She has had ataxic gait and stuttering speech as well as changes in the speech that did not appear to be related to the DBS device.  She had a CT of the brain that did not demonstrate any new infarcts.  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  This is dated 09/05/2013.  There was also evidence of fibromuscular dysplasia in the bilateral high cervical internal carotid arteries.  The patient has also had shaking episodes that did not appear to be tremor.  These were captured on EEG and not felt to be epileptiform.  The patient is on Requip and calls this her "Parkinson's medication" according to [facility] (didn't do that here) although there has been no evidence that she has Parkinson's disease.  She uses that for RLS.  She states that it doesn't help but when I ask her in detail, it is a  cramping of the legs and feet and it occurs during the days and night.  She doesn't describe RLS sx's today.  She also c/o headache.  They are left sided (whole L sided).  They are throbbing and cramping.  She will have nausea and sometimes emesis and sometimes the emesis will come before the headache.  She has no photophobia or phonophobia.  She has had these since she had the surgery.  She didn't consider herself a headachy person prior to the surgery.  She has them bad one day a week but it is always there and dull the other days a week.  She has not been on a preventative headache drug (the preventative tremor drugs that could be used for headache were d/c prior to the surgery).  05/30/14 update:  The patient is seen today, accompanied by her sister who supplements the history.  Patient has a history of essential tremor.  She is status post deep brain stimulator placement on 03/23/2012 at [facility].  I reviewed records since last visit.  She went to [facility] as [facility] had reported that she had fibromuscular dysplasia.  She saw a vascular specialist in this field at [facility].  There was no evidence on his examination of fibromuscular dysplasia.  She had a carotid ultrasound that demonstrated 40-50% stenosis in the left internal carotid artery and normal in the right internal carotid artery.  Her renal duplex was negative.  She had  a normal CRP and normal CMP.  However, he felt that she had uncontrolled HTN that should be followed up on.   Therefore, her metoprolol was increased and she began to feel bad.  Her BP dropped and she began to have falls.  She went to the ER where BP was low and losartan was d/c.   She states that she had emesis in the ER but had "my headaches."  She states that the zanaflex isn't really helping the headaches but may be helping sleep some.  Pt states that headaches are in the left occiput and are sharp in quality.  She can have emesis one day a week with them but headaches are daily.  She  sees black spots, like "gnats" in the visual field with them.  No photophobia.  No phonophobia.  Had them the last several years.  Has not been checking BS.   She states that they told her in the hospital that her parkinsons may be getting worse.  I explained to her that she doesn't have parkinsons, but she was appararently was told by her previous PCP that she had PD.  I noted this same comment previously in her [facility] records.  She remains on requip but she states that it isn't helping the cramping in the legs at night.  She describes no sx's c/w RLS.  She does state that after her BP dropped and she was in the hospital, her stuttering speech came back; "it happens every time."  08/11/14 update:  Pt requested work in appt today.  Went to [facility] hospital 2 days ago and was released yesterday.  I don't have records.  She had acute onset of dizziness, bp elevation and emesis.  She states that her BP at home was 183/53 and was in the 190's when she went to her PCP.  She states that after her BP was elevated she got a bad headache.  They apparently did a head CT that was negative.  She did bring in labs including cmp and UA that was normal.  She was told at the hospital that she couldn't have anything for the headache because she got botox.  She was told this by a neurologist through telemedicine at [facility].  She states that she had previously been doing great with the botox.  She does state that this was her normal headache but the dizziness was different.  She states that the dizziness is spinning.  It is worse with bending over.  No nausea or emesis since released.  09/28/14 update:  The patient is following up today.   The patient did have an MRI of the brain since last visit.  This was done on 06/15/2014.  I reviewed this.  There was moderate small vessel disease.  There was T2 prolongation around the lead on the left which likely represents gliosis.  The leads are not quite symmetric.  I was able to get ER  records from her June episode at [facility] it is said that she presented with her fourth episode they are of recurrent headache with expressive aphasia.  That was not what she had described to me last visit.  Tele-neurology was contacted to rule out that she was a TPA candidate.  Her expressive aphasia was described as word finding difficulties.  The patient has had many episodes of speech changes in the past.  Her episodes were previously captured on EEG at [facility] and felt to be non-epileptiform in nature.  The patient does have a  history of chronic migraine with aura and received her first series of Botox about 3 months ago and is due for her next series within the next few weeks.  She reports that headaches have been picking up again.  She is supposed to be off requip but isn't sure that she is.  Tremor has been good.  Her sister just had surgery so she has been caregiving.    12/29/14 update:  The patient is following up today, accompanied by her sister who supplements the history.  The patient has a history of essential tremor, status post deep brain stimulation.  She also has a history of chronic migraine with aura.  This has been the bigger problem as of late.  She underwent her last Botox on 10/13/2014.  She called me on 11/07/2014 NSAID that she had had persistent headache and emesis since her Botox.  She tried both Medrol and Toradol and had some relief, but not complete relief.  She denies any relief with that today but had previously said she got some relief.  She called here again on 11/24/2014.  I was out of the office but my partner started her on Pamelor, 10 mg at night; it caused nightmares initially but they are gone now.  She states that the start at the front of the head and radiate to the back bilaterally.  They are pounding and sometimes stabbing.  They wax and wane in intensity.  Rarely it goes away; it is generally always there.  No new medications.  She remains on Zanaflex for prophylaxis as  well.  She has previously been on topiramate, propranolol, gabapentin as well.  In regards to her essential tremor, last visit I asked her to stop her Requip.  She isn't sure if she is on that or not but thinks that perhaps she d/c it.  Said speech intermittently getting worse with "episodes" that she has had in the past.  04/02/15 update:  The patient is following up today for her tremor and migraine.  She is doing fairly well with DBS and I confirmed with her pharmacy that she has been off of Requip since March, 2015.  She does have history of chronic migraine with aura.  Her last Botox injections were on 01/19/2015.  I had increased her Pamelor to 50 mg last visit.  She called and stated that this caused nightmares so I started her on Zonegran.  Today, she states that she is not even sure that she is on that (she has trouble remembering her meds and doesn't bring them).  Once I described them, however, she does state that she is on them.  She wondered why I had not been refilling the zanaflex and I told her that she is supposed to be off of that and she is b/c she didn't have it.  Headaches are doing well but she does state that she can't do botox any longer because of cost.  She did fall outside since our last visit and got tendinitis in the L elbow (end of November) and she saw an ortho surgeon (doesn't know who) and her arm is in a splint.    Current/Previously tried tremor medications: topamax, propranolol, primidone, gabapentin, zanaflex  Current medications that may exacerbate tremor:  n/a  Outside reports reviewed: historical medical records, radiology reports and referral letter/letters.  Allergies  Allergen Reactions  . Bupropion Other (See Comments)  . Codeine Other (See Comments)    Jittery  . Sulfa Antibiotics Other (See Comments)  .  Tape Other (See Comments)    Uncoded Allergy. Allergen: BANDAIDS  . Trichlormethiazide Other (See Comments)  . Penicillins Rash  . Silver Rash     Patient has moderate blistering with tegaderm use    Outpatient Encounter Prescriptions as of 04/02/2015  Medication Sig  . aspirin EC 81 MG tablet Take 81 mg by mouth daily.   Marland Kitchen CALCIUM PO Take 1,000 mg by mouth daily.  . Cholecalciferol (VITAMIN D3) 1000 UNITS CAPS Take 1,000 Units by mouth daily.   . Cyanocobalamin 1000 MCG TBCR Take by mouth daily.   Marland Kitchen escitalopram (LEXAPRO) 20 MG tablet Take 20 mg by mouth daily.   . fluticasone (FLONASE) 50 MCG/ACT nasal spray Place 1 spray into the nose daily.   . Ipratropium-Albuterol (COMBIVENT) 20-100 MCG/ACT AERS respimat Inhale 1 puff into the lungs every 6 (six) hours as needed.   . metFORMIN (GLUCOPHAGE-XR) 500 MG 24 hr tablet Take 500 mg by mouth daily with breakfast.   . metoprolol succinate (TOPROL-XL) 50 MG 24 hr tablet Take 50 mg by mouth daily.   . montelukast (SINGULAIR) 10 MG tablet Take 10 mg by mouth at bedtime.   Marland Kitchen omeprazole (PRILOSEC) 20 MG capsule Take 20 mg by mouth daily. TAKE 1 CAPSULE (20 MG TOTAL) BY MOUTH DAILY.  Marland Kitchen rOPINIRole (REQUIP) 2 MG tablet Take 2 mg by mouth at bedtime.   . rosuvastatin (CRESTOR) 40 MG tablet Take 40 mg by mouth daily.   Marland Kitchen tiZANidine (ZANAFLEX) 4 MG tablet TAKE TWO TABLETS BY MOUTH AT BEDTIME  . zonisamide (ZONEGRAN) 100 MG capsule Take one capsule by mouth at bedtime for two weeks, then increase to two capsules at bedtime   No facility-administered encounter medications on file as of 04/02/2015.    Past Medical History  Diagnosis Date  . Diabetes (HCC)   . Hypertension   . Asthma   . Essential tremor   . Reflux   . Depression   . Anxiety     Past Surgical History  Procedure Laterality Date  . Burr hole w/ stereotactic insertion of dbs leads / intraop microelectrode recording  04/03/2012  . Abdominal hysterectomy    . Exploratory laparotomy    . Ankle surgery Right   . Hand surgery Bilateral   . Cataract extraction, bilateral    . Deep brain stimulator placement      Medtronic ACTIVA  PC    Social History   Social History  . Marital Status: Single    Spouse Name: N/A  . Number of Children: N/A  . Years of Education: N/A   Occupational History  . retired     Manufacturing systems engineer for phones   Social History Main Topics  . Smoking status: Former Smoker    Quit date: 03/10/2012  . Smokeless tobacco: Not on file  . Alcohol Use: No  . Drug Use: No  . Sexual Activity: Not on file   Other Topics Concern  . Not on file   Social History Narrative    Family Status  Relation Status Death Age  . Mother Alive     dementia  . Father Deceased     MI  . Sister Alive     breast cancer  . Sister Alive     ET  . Sister Alive     heart disease  . Brother Alive     ET  . Son Alive     healthy    Review of Systems A complete 10 system  ROS was obtained and was negative apart from what is mentioned.   Objective:   VITALS:   There were no vitals filed for this visit. Wt Readings from Last 3 Encounters:  12/29/14 135 lb (61.236 kg)  09/28/14 142 lb (64.411 kg)  08/11/14 147 lb (66.679 kg)    Gen:  Appears stated age and in NAD. HEENT:  Normocephalic, atraumatic. The mucous membranes are moist. The superficial temporal arteries are without ropiness or tenderness. Cardiovascular: Regular rate and rhythm. Lungs: Clear to auscultation bilaterally. Neck: There are no carotid bruits noted bilaterally.  NEUROLOGICAL:  Orientation:  The patient is alert and oriented x 3.   Cranial nerves: There is good facial symmetry. The pupils are equal round and reactive to light bilaterally. Fundoscopic exam reveals clear disc margins bilaterally. Extraocular muscles are intact and visual fields are full to confrontational testing. Speech is normal today, without stuttering quality. Tone: Tone is good throughout.  No rigidity Sensation: Sensation is intact to light touch throughout Coordination:  The patient has no dysdiadichokinesia.  She has no difficulty with  heel-to-shin.  She does have some difficulty with finger-nose-finger but more secondary to ataxia Motor: Strength is at least 5-/5 in the UE/LE; there is give way weakness.  Has splint over part of L hand today so grip strength not able to be tested.  Shoulder shrug is equal bilaterally.  There is no pronator drift.  There are no fasciculations noted. Gait and Station: The patient ambulates well intermittently and then has some astasia abasia.    MOVEMENT EXAM: Tremor:  There is no tremor of the outstretched hands.  There is some ataxia with FNF but no real tremor      Assessment/Plan:   1.  Essential Tremor.  -She is status post bilateral VIM DBS at [facility] on 03/23/2012 with revision of the generator into the abdomen.  -programmed her stimulator today.  2.  Hx of episodic speech changes  -Investigated at [facility] and worked up for stroke.  Does not seem to be related to the DBS.  In fact, turning off DBS makes speech worse.  -suspect psychogenic based on examination.  Told her that I think that she needs a counselor as think that she manifests stress in somatic way.  -MRI of the brain on 06/15/2014 did not demonstrate any evidence of new infarction, but did demonstrate moderate small vessel disease.  She will remain on aspirin.  3.  Hx of episodic "shaking episodes" unrelated to tremor  -captured on EEG at [facility] and felt to be non-epileptogenic   4.  Possible fibromuscular dysplasia  -She saw a vascular specialist in this field at [facility].  There was no evidence on his examination of fibromuscular dysplasia.  5.  Leg cramping.  -Better with tonic water  6.  Chronic migraine with aura  -doing better but no longer able to afford botox.  Will check with [facility] to see if have pt assist program.    -doing better with zonegran  - 2 at night.    7. F/u 4 months

## 2015-04-13 ENCOUNTER — Ambulatory Visit: Payer: Medicare Other | Admitting: Neurology

## 2015-04-18 DIAGNOSIS — M654 Radial styloid tenosynovitis [de Quervain]: Secondary | ICD-10-CM | POA: Diagnosis not present

## 2015-04-18 DIAGNOSIS — M7712 Lateral epicondylitis, left elbow: Secondary | ICD-10-CM | POA: Diagnosis not present

## 2015-04-18 DIAGNOSIS — M1812 Unilateral primary osteoarthritis of first carpometacarpal joint, left hand: Secondary | ICD-10-CM | POA: Diagnosis not present

## 2015-05-22 DIAGNOSIS — E782 Mixed hyperlipidemia: Secondary | ICD-10-CM | POA: Diagnosis not present

## 2015-05-22 DIAGNOSIS — I10 Essential (primary) hypertension: Secondary | ICD-10-CM | POA: Diagnosis not present

## 2015-05-22 DIAGNOSIS — K219 Gastro-esophageal reflux disease without esophagitis: Secondary | ICD-10-CM | POA: Diagnosis not present

## 2015-05-22 DIAGNOSIS — E119 Type 2 diabetes mellitus without complications: Secondary | ICD-10-CM | POA: Diagnosis not present

## 2015-05-23 DIAGNOSIS — I10 Essential (primary) hypertension: Secondary | ICD-10-CM | POA: Diagnosis not present

## 2015-05-23 DIAGNOSIS — I773 Arterial fibromuscular dysplasia: Secondary | ICD-10-CM | POA: Diagnosis not present

## 2015-05-23 DIAGNOSIS — Z9181 History of falling: Secondary | ICD-10-CM | POA: Diagnosis not present

## 2015-05-23 DIAGNOSIS — E119 Type 2 diabetes mellitus without complications: Secondary | ICD-10-CM | POA: Diagnosis not present

## 2015-05-28 DIAGNOSIS — Z0000 Encounter for general adult medical examination without abnormal findings: Secondary | ICD-10-CM | POA: Insufficient documentation

## 2015-05-29 DIAGNOSIS — Z0000 Encounter for general adult medical examination without abnormal findings: Secondary | ICD-10-CM | POA: Diagnosis not present

## 2015-05-29 DIAGNOSIS — G250 Essential tremor: Secondary | ICD-10-CM | POA: Diagnosis not present

## 2015-05-29 DIAGNOSIS — Z01419 Encounter for gynecological examination (general) (routine) without abnormal findings: Secondary | ICD-10-CM | POA: Diagnosis not present

## 2015-05-30 DIAGNOSIS — M1812 Unilateral primary osteoarthritis of first carpometacarpal joint, left hand: Secondary | ICD-10-CM | POA: Diagnosis not present

## 2015-06-11 DIAGNOSIS — R4702 Dysphasia: Secondary | ICD-10-CM | POA: Diagnosis not present

## 2015-06-11 DIAGNOSIS — R251 Tremor, unspecified: Secondary | ICD-10-CM | POA: Diagnosis not present

## 2015-06-11 DIAGNOSIS — G2581 Restless legs syndrome: Secondary | ICD-10-CM | POA: Diagnosis not present

## 2015-06-11 DIAGNOSIS — R930 Abnormal findings on diagnostic imaging of skull and head, not elsewhere classified: Secondary | ICD-10-CM | POA: Diagnosis not present

## 2015-06-11 DIAGNOSIS — E785 Hyperlipidemia, unspecified: Secondary | ICD-10-CM | POA: Diagnosis not present

## 2015-06-11 DIAGNOSIS — R51 Headache: Secondary | ICD-10-CM | POA: Diagnosis not present

## 2015-06-11 DIAGNOSIS — I517 Cardiomegaly: Secondary | ICD-10-CM | POA: Diagnosis not present

## 2015-06-11 DIAGNOSIS — I10 Essential (primary) hypertension: Secondary | ICD-10-CM | POA: Diagnosis not present

## 2015-06-11 DIAGNOSIS — G8194 Hemiplegia, unspecified affecting left nondominant side: Secondary | ICD-10-CM | POA: Diagnosis not present

## 2015-06-11 DIAGNOSIS — R209 Unspecified disturbances of skin sensation: Secondary | ICD-10-CM | POA: Diagnosis not present

## 2015-06-11 DIAGNOSIS — J9801 Acute bronchospasm: Secondary | ICD-10-CM | POA: Diagnosis not present

## 2015-06-11 DIAGNOSIS — R270 Ataxia, unspecified: Secondary | ICD-10-CM | POA: Diagnosis not present

## 2015-06-11 DIAGNOSIS — R2990 Unspecified symptoms and signs involving the nervous system: Secondary | ICD-10-CM | POA: Diagnosis not present

## 2015-06-11 DIAGNOSIS — E119 Type 2 diabetes mellitus without complications: Secondary | ICD-10-CM | POA: Diagnosis not present

## 2015-06-11 DIAGNOSIS — J449 Chronic obstructive pulmonary disease, unspecified: Secondary | ICD-10-CM | POA: Diagnosis not present

## 2015-06-11 DIAGNOSIS — G20 Parkinson's disease: Secondary | ICD-10-CM | POA: Diagnosis not present

## 2015-06-11 DIAGNOSIS — Z9181 History of falling: Secondary | ICD-10-CM | POA: Diagnosis not present

## 2015-06-12 DIAGNOSIS — I517 Cardiomegaly: Secondary | ICD-10-CM | POA: Diagnosis not present

## 2015-06-12 DIAGNOSIS — R51 Headache: Secondary | ICD-10-CM | POA: Diagnosis not present

## 2015-06-12 DIAGNOSIS — I6523 Occlusion and stenosis of bilateral carotid arteries: Secondary | ICD-10-CM | POA: Diagnosis not present

## 2015-06-13 DIAGNOSIS — M160 Bilateral primary osteoarthritis of hip: Secondary | ICD-10-CM | POA: Diagnosis not present

## 2015-06-14 DIAGNOSIS — I639 Cerebral infarction, unspecified: Secondary | ICD-10-CM | POA: Diagnosis not present

## 2015-06-14 DIAGNOSIS — R251 Tremor, unspecified: Secondary | ICD-10-CM | POA: Diagnosis not present

## 2015-06-14 DIAGNOSIS — G250 Essential tremor: Secondary | ICD-10-CM | POA: Diagnosis not present

## 2015-06-14 DIAGNOSIS — G20 Parkinson's disease: Secondary | ICD-10-CM | POA: Diagnosis not present

## 2015-06-14 DIAGNOSIS — E785 Hyperlipidemia, unspecified: Secondary | ICD-10-CM | POA: Diagnosis not present

## 2015-06-14 DIAGNOSIS — J9801 Acute bronchospasm: Secondary | ICD-10-CM | POA: Diagnosis not present

## 2015-06-14 DIAGNOSIS — G2581 Restless legs syndrome: Secondary | ICD-10-CM | POA: Diagnosis not present

## 2015-06-14 DIAGNOSIS — M6281 Muscle weakness (generalized): Secondary | ICD-10-CM | POA: Diagnosis not present

## 2015-06-14 DIAGNOSIS — E119 Type 2 diabetes mellitus without complications: Secondary | ICD-10-CM | POA: Diagnosis not present

## 2015-06-14 DIAGNOSIS — I69354 Hemiplegia and hemiparesis following cerebral infarction affecting left non-dominant side: Secondary | ICD-10-CM | POA: Diagnosis not present

## 2015-06-14 DIAGNOSIS — R270 Ataxia, unspecified: Secondary | ICD-10-CM | POA: Diagnosis not present

## 2015-06-14 DIAGNOSIS — Z9181 History of falling: Secondary | ICD-10-CM | POA: Diagnosis not present

## 2015-06-14 DIAGNOSIS — R2689 Other abnormalities of gait and mobility: Secondary | ICD-10-CM | POA: Diagnosis not present

## 2015-06-14 DIAGNOSIS — R51 Headache: Secondary | ICD-10-CM | POA: Diagnosis not present

## 2015-06-14 DIAGNOSIS — J449 Chronic obstructive pulmonary disease, unspecified: Secondary | ICD-10-CM | POA: Diagnosis not present

## 2015-06-14 DIAGNOSIS — I69320 Aphasia following cerebral infarction: Secondary | ICD-10-CM | POA: Diagnosis not present

## 2015-06-14 DIAGNOSIS — I10 Essential (primary) hypertension: Secondary | ICD-10-CM | POA: Diagnosis not present

## 2015-07-11 DIAGNOSIS — M1812 Unilateral primary osteoarthritis of first carpometacarpal joint, left hand: Secondary | ICD-10-CM | POA: Diagnosis not present

## 2015-07-31 ENCOUNTER — Ambulatory Visit: Payer: Medicare Other | Admitting: Neurology

## 2015-08-03 DIAGNOSIS — E782 Mixed hyperlipidemia: Secondary | ICD-10-CM | POA: Diagnosis not present

## 2015-08-03 DIAGNOSIS — E119 Type 2 diabetes mellitus without complications: Secondary | ICD-10-CM | POA: Diagnosis not present

## 2015-08-03 DIAGNOSIS — I69354 Hemiplegia and hemiparesis following cerebral infarction affecting left non-dominant side: Secondary | ICD-10-CM | POA: Diagnosis not present

## 2015-08-03 DIAGNOSIS — I10 Essential (primary) hypertension: Secondary | ICD-10-CM | POA: Diagnosis not present

## 2015-08-03 DIAGNOSIS — G250 Essential tremor: Secondary | ICD-10-CM | POA: Diagnosis not present

## 2015-08-08 DIAGNOSIS — G250 Essential tremor: Secondary | ICD-10-CM | POA: Diagnosis not present

## 2015-08-08 DIAGNOSIS — I10 Essential (primary) hypertension: Secondary | ICD-10-CM | POA: Diagnosis not present

## 2015-08-08 DIAGNOSIS — I69354 Hemiplegia and hemiparesis following cerebral infarction affecting left non-dominant side: Secondary | ICD-10-CM | POA: Diagnosis not present

## 2015-08-08 DIAGNOSIS — E119 Type 2 diabetes mellitus without complications: Secondary | ICD-10-CM | POA: Diagnosis not present

## 2015-09-05 DIAGNOSIS — Z7984 Long term (current) use of oral hypoglycemic drugs: Secondary | ICD-10-CM | POA: Diagnosis not present

## 2015-09-05 DIAGNOSIS — G459 Transient cerebral ischemic attack, unspecified: Secondary | ICD-10-CM | POA: Diagnosis not present

## 2015-09-05 DIAGNOSIS — Z91040 Latex allergy status: Secondary | ICD-10-CM | POA: Diagnosis not present

## 2015-09-05 DIAGNOSIS — R51 Headache: Secondary | ICD-10-CM | POA: Diagnosis not present

## 2015-09-05 DIAGNOSIS — R29818 Other symptoms and signs involving the nervous system: Secondary | ICD-10-CM | POA: Diagnosis not present

## 2015-09-05 DIAGNOSIS — I6529 Occlusion and stenosis of unspecified carotid artery: Secondary | ICD-10-CM | POA: Diagnosis not present

## 2015-09-05 DIAGNOSIS — Z885 Allergy status to narcotic agent status: Secondary | ICD-10-CM | POA: Diagnosis not present

## 2015-09-05 DIAGNOSIS — G250 Essential tremor: Secondary | ICD-10-CM | POA: Diagnosis not present

## 2015-09-05 DIAGNOSIS — Z7982 Long term (current) use of aspirin: Secondary | ICD-10-CM | POA: Diagnosis not present

## 2015-09-05 DIAGNOSIS — I10 Essential (primary) hypertension: Secondary | ICD-10-CM | POA: Diagnosis not present

## 2015-09-05 DIAGNOSIS — Z79899 Other long term (current) drug therapy: Secondary | ICD-10-CM | POA: Diagnosis not present

## 2015-09-05 DIAGNOSIS — R531 Weakness: Secondary | ICD-10-CM | POA: Diagnosis not present

## 2015-09-05 DIAGNOSIS — I728 Aneurysm of other specified arteries: Secondary | ICD-10-CM | POA: Diagnosis not present

## 2015-09-05 DIAGNOSIS — Z888 Allergy status to other drugs, medicaments and biological substances status: Secondary | ICD-10-CM | POA: Diagnosis not present

## 2015-09-05 DIAGNOSIS — I69354 Hemiplegia and hemiparesis following cerebral infarction affecting left non-dominant side: Secondary | ICD-10-CM | POA: Diagnosis not present

## 2015-09-05 DIAGNOSIS — Z880 Allergy status to penicillin: Secondary | ICD-10-CM | POA: Diagnosis not present

## 2015-09-05 DIAGNOSIS — R262 Difficulty in walking, not elsewhere classified: Secondary | ICD-10-CM | POA: Diagnosis not present

## 2015-09-06 DIAGNOSIS — R531 Weakness: Secondary | ICD-10-CM | POA: Diagnosis not present

## 2015-09-07 DIAGNOSIS — Z79899 Other long term (current) drug therapy: Secondary | ICD-10-CM | POA: Diagnosis not present

## 2015-09-07 DIAGNOSIS — I639 Cerebral infarction, unspecified: Secondary | ICD-10-CM | POA: Diagnosis not present

## 2015-09-07 DIAGNOSIS — R51 Headache: Secondary | ICD-10-CM | POA: Diagnosis not present

## 2015-09-07 DIAGNOSIS — Z880 Allergy status to penicillin: Secondary | ICD-10-CM | POA: Diagnosis not present

## 2015-09-07 DIAGNOSIS — R531 Weakness: Secondary | ICD-10-CM | POA: Diagnosis not present

## 2015-09-07 DIAGNOSIS — E119 Type 2 diabetes mellitus without complications: Secondary | ICD-10-CM | POA: Diagnosis not present

## 2015-09-07 DIAGNOSIS — I10 Essential (primary) hypertension: Secondary | ICD-10-CM | POA: Diagnosis not present

## 2015-09-07 DIAGNOSIS — K219 Gastro-esophageal reflux disease without esophagitis: Secondary | ICD-10-CM | POA: Diagnosis not present

## 2015-09-07 DIAGNOSIS — Z7982 Long term (current) use of aspirin: Secondary | ICD-10-CM | POA: Diagnosis not present

## 2015-09-07 DIAGNOSIS — Z7984 Long term (current) use of oral hypoglycemic drugs: Secondary | ICD-10-CM | POA: Diagnosis not present

## 2015-09-07 DIAGNOSIS — G20 Parkinson's disease: Secondary | ICD-10-CM | POA: Diagnosis not present

## 2015-09-07 DIAGNOSIS — R262 Difficulty in walking, not elsewhere classified: Secondary | ICD-10-CM | POA: Diagnosis not present

## 2015-09-07 DIAGNOSIS — J449 Chronic obstructive pulmonary disease, unspecified: Secondary | ICD-10-CM | POA: Diagnosis not present

## 2015-09-07 DIAGNOSIS — Z91040 Latex allergy status: Secondary | ICD-10-CM | POA: Diagnosis not present

## 2015-09-07 DIAGNOSIS — I728 Aneurysm of other specified arteries: Secondary | ICD-10-CM | POA: Diagnosis not present

## 2015-09-07 DIAGNOSIS — R278 Other lack of coordination: Secondary | ICD-10-CM | POA: Diagnosis not present

## 2015-09-07 DIAGNOSIS — E785 Hyperlipidemia, unspecified: Secondary | ICD-10-CM | POA: Diagnosis not present

## 2015-09-07 DIAGNOSIS — J309 Allergic rhinitis, unspecified: Secondary | ICD-10-CM | POA: Diagnosis not present

## 2015-09-07 DIAGNOSIS — Z888 Allergy status to other drugs, medicaments and biological substances status: Secondary | ICD-10-CM | POA: Diagnosis not present

## 2015-09-07 DIAGNOSIS — M6281 Muscle weakness (generalized): Secondary | ICD-10-CM | POA: Diagnosis not present

## 2015-09-07 DIAGNOSIS — Z885 Allergy status to narcotic agent status: Secondary | ICD-10-CM | POA: Diagnosis not present

## 2015-09-07 DIAGNOSIS — G250 Essential tremor: Secondary | ICD-10-CM | POA: Diagnosis not present

## 2015-09-07 DIAGNOSIS — I725 Aneurysm of other precerebral arteries: Secondary | ICD-10-CM | POA: Diagnosis not present

## 2015-09-07 DIAGNOSIS — I63511 Cerebral infarction due to unspecified occlusion or stenosis of right middle cerebral artery: Secondary | ICD-10-CM | POA: Diagnosis not present

## 2015-09-08 DIAGNOSIS — I639 Cerebral infarction, unspecified: Secondary | ICD-10-CM | POA: Diagnosis not present

## 2015-09-18 DIAGNOSIS — I639 Cerebral infarction, unspecified: Secondary | ICD-10-CM | POA: Diagnosis not present

## 2015-09-30 DIAGNOSIS — L299 Pruritus, unspecified: Secondary | ICD-10-CM | POA: Insufficient documentation

## 2015-10-20 DIAGNOSIS — E782 Mixed hyperlipidemia: Secondary | ICD-10-CM | POA: Diagnosis not present

## 2015-10-20 DIAGNOSIS — I10 Essential (primary) hypertension: Secondary | ICD-10-CM | POA: Diagnosis not present

## 2015-10-20 DIAGNOSIS — E119 Type 2 diabetes mellitus without complications: Secondary | ICD-10-CM | POA: Diagnosis not present

## 2015-10-20 DIAGNOSIS — G250 Essential tremor: Secondary | ICD-10-CM | POA: Diagnosis not present

## 2015-10-20 DIAGNOSIS — I63511 Cerebral infarction due to unspecified occlusion or stenosis of right middle cerebral artery: Secondary | ICD-10-CM | POA: Diagnosis not present

## 2015-11-20 DIAGNOSIS — Z87891 Personal history of nicotine dependence: Secondary | ICD-10-CM | POA: Diagnosis not present

## 2015-11-20 DIAGNOSIS — G250 Essential tremor: Secondary | ICD-10-CM | POA: Diagnosis not present

## 2015-11-20 DIAGNOSIS — Z9689 Presence of other specified functional implants: Secondary | ICD-10-CM | POA: Diagnosis not present

## 2015-11-27 DIAGNOSIS — G250 Essential tremor: Secondary | ICD-10-CM | POA: Diagnosis not present

## 2015-11-27 DIAGNOSIS — E119 Type 2 diabetes mellitus without complications: Secondary | ICD-10-CM | POA: Diagnosis not present

## 2015-11-27 DIAGNOSIS — I63511 Cerebral infarction due to unspecified occlusion or stenosis of right middle cerebral artery: Secondary | ICD-10-CM | POA: Diagnosis not present

## 2015-11-27 DIAGNOSIS — I10 Essential (primary) hypertension: Secondary | ICD-10-CM | POA: Diagnosis not present

## 2015-11-27 DIAGNOSIS — E782 Mixed hyperlipidemia: Secondary | ICD-10-CM | POA: Diagnosis not present

## 2015-12-03 DIAGNOSIS — R197 Diarrhea, unspecified: Secondary | ICD-10-CM | POA: Diagnosis not present

## 2015-12-31 DIAGNOSIS — Z23 Encounter for immunization: Secondary | ICD-10-CM | POA: Diagnosis not present

## 2015-12-31 DIAGNOSIS — E119 Type 2 diabetes mellitus without complications: Secondary | ICD-10-CM | POA: Diagnosis not present

## 2015-12-31 DIAGNOSIS — I63511 Cerebral infarction due to unspecified occlusion or stenosis of right middle cerebral artery: Secondary | ICD-10-CM | POA: Diagnosis not present

## 2015-12-31 DIAGNOSIS — I10 Essential (primary) hypertension: Secondary | ICD-10-CM | POA: Diagnosis not present

## 2015-12-31 DIAGNOSIS — E782 Mixed hyperlipidemia: Secondary | ICD-10-CM | POA: Diagnosis not present

## 2015-12-31 DIAGNOSIS — G250 Essential tremor: Secondary | ICD-10-CM | POA: Diagnosis not present

## 2016-01-03 DIAGNOSIS — R531 Weakness: Secondary | ICD-10-CM | POA: Diagnosis not present

## 2016-01-03 DIAGNOSIS — Z91040 Latex allergy status: Secondary | ICD-10-CM | POA: Diagnosis not present

## 2016-01-03 DIAGNOSIS — E119 Type 2 diabetes mellitus without complications: Secondary | ICD-10-CM | POA: Diagnosis not present

## 2016-01-03 DIAGNOSIS — Z880 Allergy status to penicillin: Secondary | ICD-10-CM | POA: Diagnosis not present

## 2016-01-03 DIAGNOSIS — K219 Gastro-esophageal reflux disease without esophagitis: Secondary | ICD-10-CM | POA: Diagnosis not present

## 2016-01-03 DIAGNOSIS — G2581 Restless legs syndrome: Secondary | ICD-10-CM | POA: Diagnosis not present

## 2016-01-03 DIAGNOSIS — R0602 Shortness of breath: Secondary | ICD-10-CM | POA: Diagnosis not present

## 2016-01-03 DIAGNOSIS — I951 Orthostatic hypotension: Secondary | ICD-10-CM | POA: Diagnosis not present

## 2016-01-03 DIAGNOSIS — E785 Hyperlipidemia, unspecified: Secondary | ICD-10-CM | POA: Diagnosis not present

## 2016-01-03 DIAGNOSIS — R251 Tremor, unspecified: Secondary | ICD-10-CM | POA: Diagnosis not present

## 2016-01-03 DIAGNOSIS — Z87891 Personal history of nicotine dependence: Secondary | ICD-10-CM | POA: Diagnosis not present

## 2016-01-03 DIAGNOSIS — N390 Urinary tract infection, site not specified: Secondary | ICD-10-CM | POA: Diagnosis not present

## 2016-01-03 DIAGNOSIS — Z885 Allergy status to narcotic agent status: Secondary | ICD-10-CM | POA: Diagnosis not present

## 2016-01-03 DIAGNOSIS — N3001 Acute cystitis with hematuria: Secondary | ICD-10-CM | POA: Diagnosis not present

## 2016-01-03 DIAGNOSIS — I10 Essential (primary) hypertension: Secondary | ICD-10-CM | POA: Diagnosis not present

## 2016-01-03 DIAGNOSIS — Z79899 Other long term (current) drug therapy: Secondary | ICD-10-CM | POA: Diagnosis not present

## 2016-01-03 DIAGNOSIS — B9620 Unspecified Escherichia coli [E. coli] as the cause of diseases classified elsewhere: Secondary | ICD-10-CM | POA: Diagnosis not present

## 2016-01-03 DIAGNOSIS — Z91048 Other nonmedicinal substance allergy status: Secondary | ICD-10-CM | POA: Diagnosis not present

## 2016-01-04 DIAGNOSIS — N390 Urinary tract infection, site not specified: Secondary | ICD-10-CM | POA: Diagnosis not present

## 2016-01-05 DIAGNOSIS — N390 Urinary tract infection, site not specified: Secondary | ICD-10-CM | POA: Diagnosis not present

## 2016-02-04 DIAGNOSIS — Z6821 Body mass index (BMI) 21.0-21.9, adult: Secondary | ICD-10-CM | POA: Diagnosis not present

## 2016-02-04 DIAGNOSIS — R197 Diarrhea, unspecified: Secondary | ICD-10-CM | POA: Diagnosis not present

## 2016-02-04 DIAGNOSIS — E119 Type 2 diabetes mellitus without complications: Secondary | ICD-10-CM | POA: Diagnosis not present

## 2016-02-04 DIAGNOSIS — N3000 Acute cystitis without hematuria: Secondary | ICD-10-CM | POA: Diagnosis not present

## 2016-03-13 DIAGNOSIS — Z961 Presence of intraocular lens: Secondary | ICD-10-CM | POA: Diagnosis not present

## 2016-03-13 DIAGNOSIS — H26493 Other secondary cataract, bilateral: Secondary | ICD-10-CM | POA: Diagnosis not present

## 2016-03-13 DIAGNOSIS — E119 Type 2 diabetes mellitus without complications: Secondary | ICD-10-CM | POA: Diagnosis not present

## 2016-03-13 DIAGNOSIS — Z7984 Long term (current) use of oral hypoglycemic drugs: Secondary | ICD-10-CM | POA: Diagnosis not present

## 2016-04-14 DIAGNOSIS — E782 Mixed hyperlipidemia: Secondary | ICD-10-CM | POA: Diagnosis not present

## 2016-04-14 DIAGNOSIS — E119 Type 2 diabetes mellitus without complications: Secondary | ICD-10-CM | POA: Diagnosis not present

## 2016-04-14 DIAGNOSIS — K219 Gastro-esophageal reflux disease without esophagitis: Secondary | ICD-10-CM | POA: Diagnosis not present

## 2016-04-14 DIAGNOSIS — I10 Essential (primary) hypertension: Secondary | ICD-10-CM | POA: Diagnosis not present

## 2016-04-16 DIAGNOSIS — M810 Age-related osteoporosis without current pathological fracture: Secondary | ICD-10-CM | POA: Insufficient documentation

## 2016-04-16 DIAGNOSIS — R634 Abnormal weight loss: Secondary | ICD-10-CM | POA: Insufficient documentation

## 2016-04-16 DIAGNOSIS — G4701 Insomnia due to medical condition: Secondary | ICD-10-CM | POA: Insufficient documentation

## 2016-04-16 DIAGNOSIS — F339 Major depressive disorder, recurrent, unspecified: Secondary | ICD-10-CM | POA: Insufficient documentation

## 2016-04-17 DIAGNOSIS — Z1389 Encounter for screening for other disorder: Secondary | ICD-10-CM | POA: Diagnosis not present

## 2016-04-17 DIAGNOSIS — E119 Type 2 diabetes mellitus without complications: Secondary | ICD-10-CM | POA: Diagnosis not present

## 2016-04-17 DIAGNOSIS — E782 Mixed hyperlipidemia: Secondary | ICD-10-CM | POA: Diagnosis not present

## 2016-04-17 DIAGNOSIS — G4701 Insomnia due to medical condition: Secondary | ICD-10-CM | POA: Diagnosis not present

## 2016-04-17 DIAGNOSIS — I10 Essential (primary) hypertension: Secondary | ICD-10-CM | POA: Diagnosis not present

## 2016-04-20 DIAGNOSIS — I6350 Cerebral infarction due to unspecified occlusion or stenosis of unspecified cerebral artery: Secondary | ICD-10-CM | POA: Insufficient documentation

## 2016-04-21 DIAGNOSIS — I69354 Hemiplegia and hemiparesis following cerebral infarction affecting left non-dominant side: Secondary | ICD-10-CM | POA: Diagnosis not present

## 2016-04-21 DIAGNOSIS — E7800 Pure hypercholesterolemia, unspecified: Secondary | ICD-10-CM | POA: Diagnosis not present

## 2016-04-21 DIAGNOSIS — S299XXA Unspecified injury of thorax, initial encounter: Secondary | ICD-10-CM | POA: Diagnosis not present

## 2016-04-21 DIAGNOSIS — E119 Type 2 diabetes mellitus without complications: Secondary | ICD-10-CM | POA: Diagnosis not present

## 2016-04-21 DIAGNOSIS — I10 Essential (primary) hypertension: Secondary | ICD-10-CM | POA: Diagnosis not present

## 2016-04-21 DIAGNOSIS — R531 Weakness: Secondary | ICD-10-CM | POA: Diagnosis not present

## 2016-04-21 DIAGNOSIS — Z6823 Body mass index (BMI) 23.0-23.9, adult: Secondary | ICD-10-CM | POA: Diagnosis not present

## 2016-04-21 DIAGNOSIS — K219 Gastro-esophageal reflux disease without esophagitis: Secondary | ICD-10-CM | POA: Diagnosis not present

## 2016-04-21 DIAGNOSIS — Z7902 Long term (current) use of antithrombotics/antiplatelets: Secondary | ICD-10-CM | POA: Diagnosis not present

## 2016-04-21 DIAGNOSIS — R51 Headache: Secondary | ICD-10-CM | POA: Diagnosis not present

## 2016-04-21 DIAGNOSIS — I63511 Cerebral infarction due to unspecified occlusion or stenosis of right middle cerebral artery: Secondary | ICD-10-CM | POA: Diagnosis not present

## 2016-04-21 DIAGNOSIS — G44209 Tension-type headache, unspecified, not intractable: Secondary | ICD-10-CM | POA: Diagnosis not present

## 2016-04-21 DIAGNOSIS — Z8673 Personal history of transient ischemic attack (TIA), and cerebral infarction without residual deficits: Secondary | ICD-10-CM | POA: Diagnosis not present

## 2016-04-21 DIAGNOSIS — Z79899 Other long term (current) drug therapy: Secondary | ICD-10-CM | POA: Diagnosis not present

## 2016-04-21 DIAGNOSIS — R4781 Slurred speech: Secondary | ICD-10-CM | POA: Diagnosis not present

## 2016-04-24 DIAGNOSIS — I69959 Hemiplegia and hemiparesis following unspecified cerebrovascular disease affecting unspecified side: Secondary | ICD-10-CM | POA: Insufficient documentation

## 2016-04-25 DIAGNOSIS — I69354 Hemiplegia and hemiparesis following cerebral infarction affecting left non-dominant side: Secondary | ICD-10-CM | POA: Diagnosis not present

## 2016-04-25 DIAGNOSIS — Z6823 Body mass index (BMI) 23.0-23.9, adult: Secondary | ICD-10-CM | POA: Diagnosis not present

## 2016-04-25 DIAGNOSIS — R51 Headache: Secondary | ICD-10-CM | POA: Diagnosis not present

## 2016-06-25 DIAGNOSIS — Z9689 Presence of other specified functional implants: Secondary | ICD-10-CM | POA: Diagnosis not present

## 2016-06-25 DIAGNOSIS — G250 Essential tremor: Secondary | ICD-10-CM | POA: Diagnosis not present

## 2016-06-26 DIAGNOSIS — G250 Essential tremor: Secondary | ICD-10-CM | POA: Diagnosis not present

## 2016-06-26 DIAGNOSIS — Z4542 Encounter for adjustment and management of neuropacemaker (brain) (peripheral nerve) (spinal cord): Secondary | ICD-10-CM | POA: Diagnosis not present

## 2016-06-26 DIAGNOSIS — I10 Essential (primary) hypertension: Secondary | ICD-10-CM | POA: Diagnosis not present

## 2016-06-27 DIAGNOSIS — G250 Essential tremor: Secondary | ICD-10-CM | POA: Diagnosis not present

## 2016-06-27 DIAGNOSIS — Z4542 Encounter for adjustment and management of neuropacemaker (brain) (peripheral nerve) (spinal cord): Secondary | ICD-10-CM | POA: Diagnosis not present

## 2016-07-15 DIAGNOSIS — Z4802 Encounter for removal of sutures: Secondary | ICD-10-CM | POA: Diagnosis not present

## 2016-07-15 DIAGNOSIS — R208 Other disturbances of skin sensation: Secondary | ICD-10-CM | POA: Diagnosis not present

## 2016-07-15 DIAGNOSIS — R51 Headache: Secondary | ICD-10-CM | POA: Diagnosis not present

## 2016-07-15 DIAGNOSIS — Z9689 Presence of other specified functional implants: Secondary | ICD-10-CM | POA: Diagnosis not present

## 2016-07-15 DIAGNOSIS — R260 Ataxic gait: Secondary | ICD-10-CM | POA: Diagnosis not present

## 2016-07-15 DIAGNOSIS — R2689 Other abnormalities of gait and mobility: Secondary | ICD-10-CM | POA: Diagnosis not present

## 2016-07-15 DIAGNOSIS — G250 Essential tremor: Secondary | ICD-10-CM | POA: Diagnosis not present

## 2016-07-16 DIAGNOSIS — Z9689 Presence of other specified functional implants: Secondary | ICD-10-CM | POA: Insufficient documentation

## 2016-07-25 DIAGNOSIS — I69354 Hemiplegia and hemiparesis following cerebral infarction affecting left non-dominant side: Secondary | ICD-10-CM | POA: Diagnosis not present

## 2016-07-25 DIAGNOSIS — E119 Type 2 diabetes mellitus without complications: Secondary | ICD-10-CM | POA: Diagnosis not present

## 2016-07-25 DIAGNOSIS — J301 Allergic rhinitis due to pollen: Secondary | ICD-10-CM | POA: Diagnosis not present

## 2016-07-25 DIAGNOSIS — E782 Mixed hyperlipidemia: Secondary | ICD-10-CM | POA: Diagnosis not present

## 2016-08-11 DIAGNOSIS — J441 Chronic obstructive pulmonary disease with (acute) exacerbation: Secondary | ICD-10-CM | POA: Diagnosis not present

## 2016-08-11 DIAGNOSIS — Z6823 Body mass index (BMI) 23.0-23.9, adult: Secondary | ICD-10-CM | POA: Diagnosis not present

## 2016-08-11 DIAGNOSIS — S90862A Insect bite (nonvenomous), left foot, initial encounter: Secondary | ICD-10-CM | POA: Diagnosis not present

## 2016-08-11 DIAGNOSIS — R300 Dysuria: Secondary | ICD-10-CM | POA: Diagnosis not present

## 2016-08-11 DIAGNOSIS — L03311 Cellulitis of abdominal wall: Secondary | ICD-10-CM | POA: Diagnosis not present

## 2016-08-11 DIAGNOSIS — S90869A Insect bite (nonvenomous), unspecified foot, initial encounter: Secondary | ICD-10-CM | POA: Insufficient documentation

## 2016-10-13 DIAGNOSIS — E782 Mixed hyperlipidemia: Secondary | ICD-10-CM | POA: Diagnosis not present

## 2016-10-13 DIAGNOSIS — G20 Parkinson's disease: Secondary | ICD-10-CM | POA: Diagnosis not present

## 2016-10-13 DIAGNOSIS — E119 Type 2 diabetes mellitus without complications: Secondary | ICD-10-CM | POA: Diagnosis not present

## 2016-10-13 DIAGNOSIS — I10 Essential (primary) hypertension: Secondary | ICD-10-CM | POA: Diagnosis not present

## 2016-10-17 DIAGNOSIS — E119 Type 2 diabetes mellitus without complications: Secondary | ICD-10-CM | POA: Diagnosis not present

## 2016-10-17 DIAGNOSIS — I69354 Hemiplegia and hemiparesis following cerebral infarction affecting left non-dominant side: Secondary | ICD-10-CM | POA: Diagnosis not present

## 2016-10-17 DIAGNOSIS — K219 Gastro-esophageal reflux disease without esophagitis: Secondary | ICD-10-CM | POA: Diagnosis not present

## 2016-10-17 DIAGNOSIS — E782 Mixed hyperlipidemia: Secondary | ICD-10-CM | POA: Diagnosis not present

## 2016-10-17 DIAGNOSIS — K589 Irritable bowel syndrome without diarrhea: Secondary | ICD-10-CM | POA: Insufficient documentation

## 2016-10-22 ENCOUNTER — Encounter: Payer: Self-pay | Admitting: Internal Medicine

## 2016-12-11 ENCOUNTER — Other Ambulatory Visit (HOSPITAL_COMMUNITY)
Admission: RE | Admit: 2016-12-11 | Discharge: 2016-12-11 | Disposition: A | Discharge status: Home / Self Care | Payer: Medicare Other | Source: Ambulatory Visit | Attending: Nurse Practitioner | Admitting: Nurse Practitioner

## 2016-12-11 ENCOUNTER — Encounter: Payer: Self-pay | Admitting: Nurse Practitioner

## 2016-12-11 ENCOUNTER — Ambulatory Visit (INDEPENDENT_AMBULATORY_CARE_PROVIDER_SITE_OTHER): Payer: Medicare Other | Admitting: Nurse Practitioner

## 2016-12-11 DIAGNOSIS — R197 Diarrhea, unspecified: Secondary | ICD-10-CM

## 2016-12-11 LAB — TSH: TSH: 1.208 u[IU]/mL (ref 0.350–4.500)

## 2016-12-11 LAB — LIPASE, BLOOD: LIPASE: 32 U/L (ref 11–51)

## 2016-12-11 MED ORDER — DICYCLOMINE HCL 10 MG PO CAPS: 10.0000 mg | ORAL_CAPSULE | Freq: Four times a day (QID) | ORAL | 1 refills | Status: DC | PRN

## 2016-12-11 NOTE — Patient Instructions (Signed)
1. I have sent Bentyl to your pharmacy. These are 10 mg pills that she can take up to 4 times a day, as needed, for diarrhea. 2. Have your lab tests completed as soon as she can. 3. Collect her stool samples and bring them to the lab, not to our office. 4. Return for follow-up in 6-8 weeks. 5. Call if you have any questions or concerns.

## 2016-12-11 NOTE — Progress Notes (Signed)
cc'ed to pcp °

## 2016-12-11 NOTE — Progress Notes (Signed)
Primary Care Physician:  Selinda Flavin, MD Primary Gastroenterologist:  Dr. Jena Gauss  Chief Complaint  Patient presents with  . Diarrhea    x1 yr, getting worse, has accidents    HPI:   Beth Young is a 71 y.o. female who presents On referral from primary care for evaluation of diarrhea. PCP notes reviewed. Last saw primary care 10/17/2016 which point it was noted she was having diarrhea described as loose and ongoing which started about one year prior. States that it moderately limits her activities and she generally has 4-6 stools a day but this is getting worse/increasing. Imodium has helped some. There is a query if she was having IBS diarrhea-type. Recommended she increase her fiber, continue Imodium, avoid caffeine and greasy foods.  Labs reviewed including hemoglobin A1c which was mildly elevated at 5.9, CMP with mildly elevated creatinine of 1.18 and some minor electrolyte abnormalities, otherwise normal.  Review of our system does not identify any colonoscopies completed by our system.  Today she states she's had diarrhea for the past year but this is becoming worse. Has accidents and incontinence. No previous diarrhea. States her stools are consistent with [place] 7, has 5-6 stools a day, taking Imodium every day which sometimes helps. Has to use adult pull-ups due to public accidents. Denies abdominal pain, N/V, hematochezia, melena, fever, chills. Has lost about 4-5 lbs subjectively. Poor appetite due to fear of symptoms. Last colonoscopy was an unknown amount of time a ago, in [place], states it was normal. Denies sick contacts, medications changes, dietary changes. Consumes minimal to no dairy. Denies chest pain, dyspnea, dizziness, lightheadedness, syncope, near syncope. Denies any other upper or lower GI symptoms.  She states stool studies were completed but these are not available to me. Was told they were normal.   Past Medical History:  Diagnosis Date  . Anxiety   .  Asthma   . Depression   . Diabetes (HCC)   . Essential tremor   . Hypertension   . Reflux     Past Surgical History:  Procedure Laterality Date  . ABDOMINAL HYSTERECTOMY    . ANKLE SURGERY Right   . BURR HOLE W/ STEREOTACTIC INSERTION OF DBS LEADS / INTRAOP MICROELECTRODE RECORDING  04/03/2012  . CATARACT EXTRACTION, BILATERAL    . DEEP BRAIN STIMULATOR PLACEMENT     Medtronic ACTIVA PC  . EXPLORATORY LAPAROTOMY    . HAND SURGERY Bilateral     Current Outpatient Prescriptions  Medication Sig Dispense Refill  . atorvastatin (LIPITOR) 80 MG tablet Take 1 tablet by mouth daily.    Marland Kitchen CALCIUM PO Take 1,000 mg by mouth daily.    . Cholecalciferol (VITAMIN D3) 1000 UNITS CAPS Take 1,000 Units by mouth daily.     . clopidogrel (PLAVIX) 75 MG tablet Take 1 tablet by mouth daily.    . Cyanocobalamin 1000 MCG TBCR Take by mouth daily.     Marland Kitchen escitalopram (LEXAPRO) 20 MG tablet Take 20 mg by mouth daily.     . fluticasone (FLONASE) 50 MCG/ACT nasal spray Place 1 spray into the nose daily.     Marland Kitchen glipiZIDE (GLUCOTROL) 5 MG tablet Take 2.5 mg by mouth daily.    . Ipratropium-Albuterol (COMBIVENT) 20-100 MCG/ACT AERS respimat Inhale 1 puff into the lungs every 6 (six) hours as needed.     Marland Kitchen lisinopril (PRINIVIL,ZESTRIL) 10 MG tablet Take 10 mg by mouth daily.    Marland Kitchen loperamide (IMODIUM) 2 MG capsule Take 2-4 mg by mouth  daily.    . metoprolol succinate (TOPROL-XL) 50 MG 24 hr tablet Take 50 mg by mouth daily.     . montelukast (SINGULAIR) 10 MG tablet Take 10 mg by mouth at bedtime.     Marland Kitchen omeprazole (PRILOSEC) 20 MG capsule Take 20 mg by mouth daily. TAKE 1 CAPSULE (20 MG TOTAL) BY MOUTH DAILY.    . raloxifene (EVISTA) 60 MG tablet Take 60 mg by mouth daily.    Marland Kitchen topiramate (TOPAMAX) 50 MG tablet Take 1 tablet by mouth at bedtime.     No current facility-administered medications for this visit.     Allergies as of 12/11/2016 - Review Complete 12/11/2016  Allergen Reaction Noted  . Bupropion  Other (See Comments) 04/17/2014  . Codeine Other (See Comments) 04/17/2014  . Sulfa antibiotics Other (See Comments) 04/17/2014  . Tape Other (See Comments) 04/17/2014  . Trichlormethiazide Other (See Comments) 04/17/2014  . Penicillins Rash 04/17/2014  . Silver Rash 04/17/2014    Family History  Problem Relation Age of Onset  . Colon cancer Neg Hx   . Gastric cancer Neg Hx   . Esophageal cancer Neg Hx     Social History   Social History  . Marital status: Single    Spouse name: N/A  . Number of children: N/A  . Years of education: N/A   Occupational History  . retired     Manufacturing systems engineer for phones   Social History Main Topics  . Smoking status: Former Smoker    Quit date: 03/10/2012  . Smokeless tobacco: Never Used  . Alcohol use No  . Drug use: No  . Sexual activity: Not on file   Other Topics Concern  . Not on file   Social History Narrative  . No narrative on file    Review of Systems: General: Negative for anorexia, weight loss, fever, chills, fatigue, weakness. ENT: Negative for hoarseness, difficulty swallowing. CV: Negative for chest pain, angina, palpitations, peripheral edema.  Respiratory: Negative for dyspnea at rest, cough, sputum, wheezing.  GI: See history of present illness. MS: Negative for joint pain, low back pain.  Derm: Negative for rash or itching.  Endo: Negative for unusual weight change.  Heme: Negative for bruising or bleeding. Allergy: Negative for rash or hives.    Physical Exam: BP (!) 144/70   Pulse 60   Temp (!) 96.9 F (36.1 C) (Oral)   Ht  (1.549 m)   Wt 128 lb 3.2 oz (58.2 kg)   BMI 24.22 kg/m  General:   Alert and oriented. Pleasant and cooperative. Well-nourished and well-developed.  Head:  Normocephalic and atraumatic. Eyes:  Without icterus, sclera clear and conjunctiva pink.  Ears:  Normal auditory acuity. Cardiovascular:  S1, S2 present without murmurs appreciated. Extremities without clubbing or  edema. Respiratory:  Clear to auscultation bilaterally. No wheezes, rales, or rhonchi. No distress.  Gastrointestinal:  +BS, soft, non-tender and non-distended. No HSM noted. No guarding or rebound. No masses appreciated.  Rectal:  Deferred  Musculoskalatal:  Symmetrical without gross deformities. Neurologic:  Alert and oriented x4;  grossly normal neurologically. Psych:  Alert and cooperative. Normal mood and affect. Heme/Lymph/Immune: No excessive bruising noted.    12/11/2016 9:43 AM   Disclaimer: This note was dictated with voice recognition software. Similar sounding words can inadvertently be transcribed and may not be corrected upon review.

## 2016-12-11 NOTE — Assessment & Plan Note (Signed)
The patient describes about a 1 year history of diarrhea which seems to be getting worse. She is now having incontinence episodes and accidents making it difficult to continue with her daily routine. She has lost minimal weight and continues to push fluids. She states stool studies were completed to check for infection by her primary care and those were all normal. At this point I will check TSH, lipase, and creatinine fecal elastase. I'll also check stool studies including stool sodium and potassium try to distinguish between cecum auditory and osmotic diarrhea. I will give her Bentyl samples 10 mg up to 4 times a day as needed for diarrhea. Return for follow-up in 6-8 weeks. She is unsure of when her last colonoscopy was, is pretty sure it was less than 10 years ago, but cannot remember who did it or where was completed other than "in [place]." She denies it was at [facility]. We may need to proceed with another colonoscopy if her symptoms are persistent. Return for follow-up in 6-8 weeks.

## 2016-12-16 ENCOUNTER — Other Ambulatory Visit (HOSPITAL_COMMUNITY)
Admission: RE | Admit: 2016-12-16 | Discharge: 2016-12-16 | Disposition: A | Discharge status: Home / Self Care | Payer: Medicare Other | Source: Ambulatory Visit | Attending: Nurse Practitioner | Admitting: Nurse Practitioner

## 2016-12-16 DIAGNOSIS — R197 Diarrhea, unspecified: Secondary | ICD-10-CM | POA: Diagnosis not present

## 2016-12-18 LAB — OSMOLALITY, STOOL: Osmolality,Stl: 350 mOsmol/kg

## 2016-12-19 LAB — POTASSIUM, STOOL: POTASSIUM STL: 39 mmol/L

## 2016-12-19 LAB — SODIUM, STOOL: Sodium, Stl: 115 mmol/L

## 2016-12-22 DIAGNOSIS — Z820 Family history of epilepsy and other diseases of the nervous system: Secondary | ICD-10-CM | POA: Diagnosis not present

## 2016-12-22 DIAGNOSIS — Z9689 Presence of other specified functional implants: Secondary | ICD-10-CM | POA: Diagnosis not present

## 2016-12-22 DIAGNOSIS — G250 Essential tremor: Secondary | ICD-10-CM | POA: Diagnosis not present

## 2016-12-22 DIAGNOSIS — R197 Diarrhea, unspecified: Secondary | ICD-10-CM | POA: Diagnosis not present

## 2016-12-22 DIAGNOSIS — R270 Ataxia, unspecified: Secondary | ICD-10-CM | POA: Diagnosis not present

## 2016-12-22 DIAGNOSIS — Z978 Presence of other specified devices: Secondary | ICD-10-CM | POA: Diagnosis not present

## 2016-12-23 LAB — PANCREATIC ELASTASE, FECAL: Pancreatic Elastase-1, Stool: >500 ug Elast./g (ref >200)

## 2017-01-04 DIAGNOSIS — M810 Age-related osteoporosis without current pathological fracture: Secondary | ICD-10-CM | POA: Diagnosis not present

## 2017-01-04 DIAGNOSIS — Z7902 Long term (current) use of antithrombotics/antiplatelets: Secondary | ICD-10-CM | POA: Diagnosis not present

## 2017-01-04 DIAGNOSIS — Z23 Encounter for immunization: Secondary | ICD-10-CM | POA: Diagnosis not present

## 2017-01-04 DIAGNOSIS — K219 Gastro-esophageal reflux disease without esophagitis: Secondary | ICD-10-CM | POA: Diagnosis not present

## 2017-01-04 DIAGNOSIS — I6789 Other cerebrovascular disease: Secondary | ICD-10-CM | POA: Diagnosis not present

## 2017-01-04 DIAGNOSIS — I639 Cerebral infarction, unspecified: Secondary | ICD-10-CM | POA: Diagnosis not present

## 2017-01-04 DIAGNOSIS — G4489 Other headache syndrome: Secondary | ICD-10-CM | POA: Diagnosis not present

## 2017-01-04 DIAGNOSIS — G441 Vascular headache, not elsewhere classified: Secondary | ICD-10-CM | POA: Diagnosis not present

## 2017-01-04 DIAGNOSIS — R51 Headache: Secondary | ICD-10-CM | POA: Diagnosis not present

## 2017-01-04 DIAGNOSIS — G43909 Migraine, unspecified, not intractable, without status migrainosus: Secondary | ICD-10-CM | POA: Diagnosis not present

## 2017-01-04 DIAGNOSIS — G459 Transient cerebral ischemic attack, unspecified: Secondary | ICD-10-CM | POA: Diagnosis not present

## 2017-01-04 DIAGNOSIS — I10 Essential (primary) hypertension: Secondary | ICD-10-CM | POA: Diagnosis not present

## 2017-01-04 DIAGNOSIS — Z803 Family history of malignant neoplasm of breast: Secondary | ICD-10-CM | POA: Diagnosis not present

## 2017-01-04 DIAGNOSIS — I69354 Hemiplegia and hemiparesis following cerebral infarction affecting left non-dominant side: Secondary | ICD-10-CM | POA: Diagnosis not present

## 2017-01-04 DIAGNOSIS — E119 Type 2 diabetes mellitus without complications: Secondary | ICD-10-CM | POA: Diagnosis not present

## 2017-01-04 DIAGNOSIS — Z87891 Personal history of nicotine dependence: Secondary | ICD-10-CM | POA: Diagnosis not present

## 2017-01-04 DIAGNOSIS — J449 Chronic obstructive pulmonary disease, unspecified: Secondary | ICD-10-CM | POA: Diagnosis not present

## 2017-01-04 DIAGNOSIS — Z7984 Long term (current) use of oral hypoglycemic drugs: Secondary | ICD-10-CM | POA: Diagnosis not present

## 2017-01-04 DIAGNOSIS — R4781 Slurred speech: Secondary | ICD-10-CM | POA: Diagnosis not present

## 2017-01-04 DIAGNOSIS — R531 Weakness: Secondary | ICD-10-CM | POA: Diagnosis not present

## 2017-01-04 DIAGNOSIS — Z8249 Family history of ischemic heart disease and other diseases of the circulatory system: Secondary | ICD-10-CM | POA: Diagnosis not present

## 2017-01-04 DIAGNOSIS — G250 Essential tremor: Secondary | ICD-10-CM | POA: Diagnosis not present

## 2017-01-04 DIAGNOSIS — E785 Hyperlipidemia, unspecified: Secondary | ICD-10-CM | POA: Diagnosis not present

## 2017-01-04 DIAGNOSIS — R471 Dysarthria and anarthria: Secondary | ICD-10-CM | POA: Diagnosis not present

## 2017-01-04 DIAGNOSIS — Z79899 Other long term (current) drug therapy: Secondary | ICD-10-CM | POA: Diagnosis not present

## 2017-01-05 DIAGNOSIS — M810 Age-related osteoporosis without current pathological fracture: Secondary | ICD-10-CM | POA: Diagnosis not present

## 2017-01-05 DIAGNOSIS — Z8249 Family history of ischemic heart disease and other diseases of the circulatory system: Secondary | ICD-10-CM | POA: Diagnosis not present

## 2017-01-05 DIAGNOSIS — G250 Essential tremor: Secondary | ICD-10-CM | POA: Diagnosis not present

## 2017-01-05 DIAGNOSIS — E119 Type 2 diabetes mellitus without complications: Secondary | ICD-10-CM | POA: Diagnosis not present

## 2017-01-05 DIAGNOSIS — Z7984 Long term (current) use of oral hypoglycemic drugs: Secondary | ICD-10-CM | POA: Diagnosis not present

## 2017-01-05 DIAGNOSIS — J449 Chronic obstructive pulmonary disease, unspecified: Secondary | ICD-10-CM | POA: Diagnosis not present

## 2017-01-05 DIAGNOSIS — R471 Dysarthria and anarthria: Secondary | ICD-10-CM | POA: Diagnosis not present

## 2017-01-05 DIAGNOSIS — K219 Gastro-esophageal reflux disease without esophagitis: Secondary | ICD-10-CM | POA: Diagnosis not present

## 2017-01-05 DIAGNOSIS — G441 Vascular headache, not elsewhere classified: Secondary | ICD-10-CM | POA: Diagnosis not present

## 2017-01-05 DIAGNOSIS — Z23 Encounter for immunization: Secondary | ICD-10-CM | POA: Diagnosis not present

## 2017-01-05 DIAGNOSIS — Z87891 Personal history of nicotine dependence: Secondary | ICD-10-CM | POA: Diagnosis not present

## 2017-01-05 DIAGNOSIS — E785 Hyperlipidemia, unspecified: Secondary | ICD-10-CM | POA: Diagnosis not present

## 2017-01-05 DIAGNOSIS — Z79899 Other long term (current) drug therapy: Secondary | ICD-10-CM | POA: Diagnosis not present

## 2017-01-05 DIAGNOSIS — Z803 Family history of malignant neoplasm of breast: Secondary | ICD-10-CM | POA: Diagnosis not present

## 2017-01-05 DIAGNOSIS — Z7902 Long term (current) use of antithrombotics/antiplatelets: Secondary | ICD-10-CM | POA: Diagnosis not present

## 2017-01-05 DIAGNOSIS — I69354 Hemiplegia and hemiparesis following cerebral infarction affecting left non-dominant side: Secondary | ICD-10-CM | POA: Diagnosis not present

## 2017-01-05 DIAGNOSIS — I10 Essential (primary) hypertension: Secondary | ICD-10-CM | POA: Diagnosis not present

## 2017-01-06 DIAGNOSIS — G250 Essential tremor: Secondary | ICD-10-CM | POA: Diagnosis not present

## 2017-01-06 DIAGNOSIS — E785 Hyperlipidemia, unspecified: Secondary | ICD-10-CM | POA: Diagnosis not present

## 2017-01-06 DIAGNOSIS — Z79899 Other long term (current) drug therapy: Secondary | ICD-10-CM | POA: Diagnosis not present

## 2017-01-06 DIAGNOSIS — M810 Age-related osteoporosis without current pathological fracture: Secondary | ICD-10-CM | POA: Diagnosis not present

## 2017-01-06 DIAGNOSIS — E119 Type 2 diabetes mellitus without complications: Secondary | ICD-10-CM | POA: Diagnosis not present

## 2017-01-06 DIAGNOSIS — K219 Gastro-esophageal reflux disease without esophagitis: Secondary | ICD-10-CM | POA: Diagnosis not present

## 2017-01-06 DIAGNOSIS — R471 Dysarthria and anarthria: Secondary | ICD-10-CM | POA: Diagnosis not present

## 2017-01-06 DIAGNOSIS — G441 Vascular headache, not elsewhere classified: Secondary | ICD-10-CM | POA: Diagnosis not present

## 2017-01-06 DIAGNOSIS — I10 Essential (primary) hypertension: Secondary | ICD-10-CM | POA: Diagnosis not present

## 2017-01-06 DIAGNOSIS — Z7902 Long term (current) use of antithrombotics/antiplatelets: Secondary | ICD-10-CM | POA: Diagnosis not present

## 2017-01-06 DIAGNOSIS — Z8249 Family history of ischemic heart disease and other diseases of the circulatory system: Secondary | ICD-10-CM | POA: Diagnosis not present

## 2017-01-06 DIAGNOSIS — Z23 Encounter for immunization: Secondary | ICD-10-CM | POA: Diagnosis not present

## 2017-01-06 DIAGNOSIS — J449 Chronic obstructive pulmonary disease, unspecified: Secondary | ICD-10-CM | POA: Diagnosis not present

## 2017-01-06 DIAGNOSIS — Z7984 Long term (current) use of oral hypoglycemic drugs: Secondary | ICD-10-CM | POA: Diagnosis not present

## 2017-01-06 DIAGNOSIS — Z87891 Personal history of nicotine dependence: Secondary | ICD-10-CM | POA: Diagnosis not present

## 2017-01-06 DIAGNOSIS — Z803 Family history of malignant neoplasm of breast: Secondary | ICD-10-CM | POA: Diagnosis not present

## 2017-01-06 DIAGNOSIS — I69354 Hemiplegia and hemiparesis following cerebral infarction affecting left non-dominant side: Secondary | ICD-10-CM | POA: Diagnosis not present

## 2017-01-08 DIAGNOSIS — L508 Other urticaria: Secondary | ICD-10-CM | POA: Insufficient documentation

## 2017-01-16 DIAGNOSIS — G20 Parkinson's disease: Secondary | ICD-10-CM | POA: Diagnosis not present

## 2017-01-16 DIAGNOSIS — K580 Irritable bowel syndrome with diarrhea: Secondary | ICD-10-CM | POA: Diagnosis not present

## 2017-01-16 DIAGNOSIS — I10 Essential (primary) hypertension: Secondary | ICD-10-CM | POA: Diagnosis not present

## 2017-01-16 DIAGNOSIS — J441 Chronic obstructive pulmonary disease with (acute) exacerbation: Secondary | ICD-10-CM | POA: Diagnosis not present

## 2017-01-16 DIAGNOSIS — E119 Type 2 diabetes mellitus without complications: Secondary | ICD-10-CM | POA: Diagnosis not present

## 2017-01-20 DIAGNOSIS — G4701 Insomnia due to medical condition: Secondary | ICD-10-CM | POA: Diagnosis not present

## 2017-01-20 DIAGNOSIS — G250 Essential tremor: Secondary | ICD-10-CM | POA: Diagnosis not present

## 2017-01-20 DIAGNOSIS — E782 Mixed hyperlipidemia: Secondary | ICD-10-CM | POA: Diagnosis not present

## 2017-01-20 DIAGNOSIS — E119 Type 2 diabetes mellitus without complications: Secondary | ICD-10-CM | POA: Diagnosis not present

## 2017-01-20 DIAGNOSIS — I10 Essential (primary) hypertension: Secondary | ICD-10-CM | POA: Diagnosis not present

## 2017-02-05 DIAGNOSIS — G250 Essential tremor: Secondary | ICD-10-CM | POA: Diagnosis not present

## 2017-02-05 DIAGNOSIS — G4701 Insomnia due to medical condition: Secondary | ICD-10-CM | POA: Diagnosis not present

## 2017-02-05 DIAGNOSIS — I10 Essential (primary) hypertension: Secondary | ICD-10-CM | POA: Diagnosis not present

## 2017-02-05 DIAGNOSIS — E119 Type 2 diabetes mellitus without complications: Secondary | ICD-10-CM | POA: Diagnosis not present

## 2017-02-11 DIAGNOSIS — L299 Pruritus, unspecified: Secondary | ICD-10-CM | POA: Diagnosis not present

## 2017-02-11 DIAGNOSIS — L508 Other urticaria: Secondary | ICD-10-CM | POA: Diagnosis not present

## 2017-02-11 DIAGNOSIS — G4700 Insomnia, unspecified: Secondary | ICD-10-CM | POA: Diagnosis not present

## 2017-02-11 DIAGNOSIS — Z6823 Body mass index (BMI) 23.0-23.9, adult: Secondary | ICD-10-CM | POA: Diagnosis not present

## 2017-02-17 ENCOUNTER — Ambulatory Visit: Payer: Medicare Other | Admitting: Nurse Practitioner

## 2017-03-07 DIAGNOSIS — Z79899 Other long term (current) drug therapy: Secondary | ICD-10-CM | POA: Diagnosis not present

## 2017-03-07 DIAGNOSIS — Z7984 Long term (current) use of oral hypoglycemic drugs: Secondary | ICD-10-CM | POA: Diagnosis not present

## 2017-03-07 DIAGNOSIS — G2581 Restless legs syndrome: Secondary | ICD-10-CM | POA: Diagnosis not present

## 2017-03-07 DIAGNOSIS — Z87891 Personal history of nicotine dependence: Secondary | ICD-10-CM | POA: Diagnosis not present

## 2017-03-07 DIAGNOSIS — R112 Nausea with vomiting, unspecified: Secondary | ICD-10-CM | POA: Diagnosis not present

## 2017-03-07 DIAGNOSIS — E7800 Pure hypercholesterolemia, unspecified: Secondary | ICD-10-CM | POA: Diagnosis not present

## 2017-03-07 DIAGNOSIS — Z8673 Personal history of transient ischemic attack (TIA), and cerebral infarction without residual deficits: Secondary | ICD-10-CM | POA: Diagnosis not present

## 2017-03-07 DIAGNOSIS — R319 Hematuria, unspecified: Secondary | ICD-10-CM | POA: Diagnosis not present

## 2017-03-07 DIAGNOSIS — E119 Type 2 diabetes mellitus without complications: Secondary | ICD-10-CM | POA: Diagnosis not present

## 2017-03-07 DIAGNOSIS — N390 Urinary tract infection, site not specified: Secondary | ICD-10-CM | POA: Diagnosis not present

## 2017-03-07 DIAGNOSIS — K219 Gastro-esophageal reflux disease without esophagitis: Secondary | ICD-10-CM | POA: Diagnosis not present

## 2017-03-07 DIAGNOSIS — I10 Essential (primary) hypertension: Secondary | ICD-10-CM | POA: Diagnosis not present

## 2017-03-11 DIAGNOSIS — N390 Urinary tract infection, site not specified: Secondary | ICD-10-CM | POA: Diagnosis not present

## 2017-03-11 DIAGNOSIS — I951 Orthostatic hypotension: Secondary | ICD-10-CM | POA: Diagnosis not present

## 2017-03-11 DIAGNOSIS — Z6823 Body mass index (BMI) 23.0-23.9, adult: Secondary | ICD-10-CM | POA: Diagnosis not present

## 2017-03-11 DIAGNOSIS — E860 Dehydration: Secondary | ICD-10-CM | POA: Diagnosis not present

## 2017-03-11 DIAGNOSIS — I10 Essential (primary) hypertension: Secondary | ICD-10-CM | POA: Diagnosis not present

## 2017-04-07 DIAGNOSIS — Z0001 Encounter for general adult medical examination with abnormal findings: Secondary | ICD-10-CM | POA: Diagnosis not present

## 2017-04-07 DIAGNOSIS — N390 Urinary tract infection, site not specified: Secondary | ICD-10-CM | POA: Diagnosis not present

## 2017-04-07 DIAGNOSIS — Z6823 Body mass index (BMI) 23.0-23.9, adult: Secondary | ICD-10-CM | POA: Diagnosis not present

## 2017-04-14 ENCOUNTER — Telehealth: Payer: Self-pay | Admitting: *Deleted

## 2017-04-14 ENCOUNTER — Other Ambulatory Visit: Payer: Self-pay

## 2017-04-14 ENCOUNTER — Encounter: Payer: Self-pay | Admitting: Nurse Practitioner

## 2017-04-14 ENCOUNTER — Ambulatory Visit: Payer: Medicare Other | Admitting: Nurse Practitioner

## 2017-04-14 ENCOUNTER — Encounter: Payer: Self-pay | Admitting: *Deleted

## 2017-04-14 VITALS — BP 116/60 | HR 67 | Temp 97.0°F | Ht 61.0 in | Wt 133.6 lb

## 2017-04-14 DIAGNOSIS — R197 Diarrhea, unspecified: Secondary | ICD-10-CM

## 2017-04-14 DIAGNOSIS — R159 Full incontinence of feces: Secondary | ICD-10-CM | POA: Insufficient documentation

## 2017-04-14 DIAGNOSIS — K219 Gastro-esophageal reflux disease without esophagitis: Secondary | ICD-10-CM

## 2017-04-14 DIAGNOSIS — R152 Fecal urgency: Secondary | ICD-10-CM

## 2017-04-14 MED ORDER — PEG 3350-KCL-NA BICARB-NACL 420 G PO SOLR: 4000.0000 mL | ORAL | 0 refills | Status: DC

## 2017-04-14 NOTE — Assessment & Plan Note (Signed)
Symptoms currently well controlled on PPI.  Continue medication and follow-up as needed.

## 2017-04-14 NOTE — Progress Notes (Signed)
Referring Provider: Selinda Flavin, MD Primary Care Physician:  Selinda Flavin, MD Primary GI:  Dr. Jena Gauss  Chief Complaint  Patient presents with  . Diarrhea    3-4 times per day, watery    HPI:   Beth Young is a 72 y.o. female who presents follow-up on diarrhea.  The patient was last seen in our office 12/11/2016.  Diarrhea was chronic for about 1 year prior to her last visit, moderately limiting her activities with 4-6 stools a day.  Imodium helps some.  At that time she stated her diarrhea was still getting, now with accidents and incontinence.  This had not previously been a problem for her.  She stated her stools are consistent with [place] 7, 5-6 times a day with Imodium every day which sometimes helps.  Has had to use adult briefs due to accidents.  Has lost 5 pounds subjectively.  Poor appetite due to fear of having a bowel movement/accident.  Last colonoscopy unknown amount of time ago interim and thinks it was normal.  No other GI symptoms.  States stool studies were completed and normal but these were not available to [place].  Recommended Bentyl, stool studies, serology, follow-up in 6-8 weeks.  Lipase was normal, TSH normal, pancreatic fecal elastase normal.  Stool sodium, potassium, osmolality was completed and indicated secretory diarrhea.  Indicated likely need for colonoscopy and/or CT exam.  Today she states she's doing so-so. Diarrhea is a little better, still with urgency, still with incontinence. Stools are [facility] 7, has up to 9 a day but on average 2-4. Still taking Bentyl which has helped a little bit. Denies abdominal pain, N/V, hematochezia, melena, GERD, fever, chills, dysphagia, unintentional weight loss. Objectively she is up 3-5 lbs from last visit 4 months ago. Still unsure when she had her colonoscopy last. No records of TCS in Care Everywhere. Denies chest pain, dyspnea, dizziness, lightheadedness, syncope, near syncope. Denies any other upper or lower GI  symptoms.  Doesn't eat much dairy products.  Past Medical History:  Diagnosis Date  . Anxiety   . Asthma   . Depression   . Diabetes (HCC)   . Essential tremor   . Hypertension   . Reflux     Past Surgical History:  Procedure Laterality Date  . ABDOMINAL HYSTERECTOMY    . ANKLE SURGERY Right   . BURR HOLE W/ STEREOTACTIC INSERTION OF DBS LEADS / INTRAOP MICROELECTRODE RECORDING  04/03/2012  . CATARACT EXTRACTION, BILATERAL    . DEEP BRAIN STIMULATOR PLACEMENT     [employer] ACTIVA PC  . EXPLORATORY LAPAROTOMY    . HAND SURGERY Bilateral     Current Outpatient Medications  Medication Sig Dispense Refill  . atorvastatin (LIPITOR) 80 MG tablet Take 1 tablet by mouth daily.    . carbidopa-levodopa (SINEMET IR) 25-100 MG tablet Take 1 tablet by mouth 3 (three) times daily.    . clopidogrel (PLAVIX) 75 MG tablet Take 1 tablet by mouth daily.    Marland Kitchen dicyclomine (BENTYL) 10 MG capsule Take 1 capsule (10 mg total) by mouth 4 (four) times daily as needed (diarrhea). 90 capsule 1  . glipiZIDE (GLUCOTROL) 5 MG tablet Take 2.5 mg by mouth daily.    Marland Kitchen LORazepam (ATIVAN) 1 MG tablet Take 1 mg by mouth every 8 (eight) hours.    . montelukast (SINGULAIR) 10 MG tablet Take 10 mg by mouth at bedtime.     Marland Kitchen omeprazole (PRILOSEC) 20 MG capsule Take 20 mg by mouth daily.  TAKE 1 CAPSULE (20 MG TOTAL) BY MOUTH DAILY.    Marland Kitchen. propranolol (INDERAL) 80 MG tablet Take 80 mg by mouth daily.    . raloxifene (EVISTA) 60 MG tablet Take 60 mg by mouth daily.    Marland Kitchen. topiramate (TOPAMAX) 50 MG tablet Take 1 tablet by mouth at bedtime.    . traMADol (ULTRAM) 50 MG tablet Take 50 mg by mouth 2 (two) times daily.     No current facility-administered medications for this visit.     Allergies as of 04/14/2017 - Review Complete 04/14/2017  Allergen Reaction Noted  . Bupropion Other (See Comments) 04/17/2014  . Codeine Other (See Comments) 04/17/2014  . Sulfa antibiotics Other (See Comments) 04/17/2014  . Tape Other  (See Comments) 04/17/2014  . Trichlormethiazide Other (See Comments) 04/17/2014  . Penicillins Rash 04/17/2014  . Silver Rash 04/17/2014    Family History  Problem Relation Age of Onset  . Colon cancer Neg Hx   . Gastric cancer Neg Hx   . Esophageal cancer Neg Hx     Social History   Socioeconomic History  . Marital status: Single    Spouse name: None  . Number of children: None  . Years of education: None  . Highest education level: None  Social Needs  . Financial resource strain: None  . Food insecurity - worry: None  . Food insecurity - inability: None  . Transportation needs - medical: None  . Transportation needs - non-medical: None  Occupational History  . 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 Smoking status: Former Smoker    Last attempt to quit: 03/10/2012    Years since quitting: 5.0  . Smokeless tobacco: Current User    Types: Snuff  . Tobacco comment: Snuff for about 10 years  Substance and Sexual Activity  . Alcohol use: No    Alcohol/week: 0.0 oz  . Drug use: No  . Sexual activity: None  Other Topics Concern  . None  Social History Narrative  . None    Review of Systems: General: Negative for anorexia, weight loss, fever, chills, fatigue, weakness. ENT: Negative for hoarseness, difficulty swallowing , nasal congestion. CV: Negative for chest pain, angina, palpitations, dyspnea on exertion, peripheral edema.  Respiratory: Negative for dyspnea at rest, dyspnea on exertion, cough, sputum, wheezing.  GI: See history of present illness. Endo: Negative for unusual weight change.  Heme: Negative for bruising or bleeding.  Physical Exam: BP 116/60   Pulse 67   Temp (!) 97 F (36.1 C) (Oral)   Ht 5\' 1"  (1.549 m)   Wt 133 lb 9.6 oz (60.6 kg)   BMI 25.24 kg/m  General:   Alert and oriented. Pleasant and cooperative. Well-nourished and well-developed.  Eyes:  Without icterus, sclera clear and conjunctiva pink.   Ears:  Normal auditory acuity. Cardiovascular:  S1, S2 present without murmurs appreciated. Extremities without clubbing or edema. Respiratory:  Clear to auscultation bilaterally. No wheezes, rales, or rhonchi. No distress.  Gastrointestinal:  +BS, soft, non-tender and non-distended. No HSM noted. No guarding or rebound. No masses appreciated.  Rectal:  Deferred  Musculoskalatal:  Symmetrical without gross deformities. Neurologic:  Alert and oriented x4;  grossly normal neurologically. Psych:  Alert and cooperative. Normal mood and affect. Heme/Lymph/Immune: No excessive bruising noted.    04/14/2017 9:03 AM   Disclaimer: This note was dictated with voice recognition software. Similar sounding words can inadvertently be transcribed and may not be  corrected upon review.

## 2017-04-14 NOTE — H&P (View-Only) (Signed)
Referring Provider: Selinda Flavin, MD Primary Care Physician:  Selinda Flavin, MD Primary GI:  Dr. Jena Gauss  Chief Complaint  Patient presents with  . Diarrhea    3-4 times per day, watery    HPI:   Beth Young is a 72 y.o. female who presents follow-up on diarrhea.  The patient was last seen in our office 12/11/2016.  Diarrhea was chronic for about 1 year prior to her last visit, moderately limiting her activities with 4-6 stools a day.  Imodium helps some.  At that time she stated her diarrhea was still getting, now with accidents and incontinence.  This had not previously been a problem for her.  She stated her stools are consistent with [place] 7, 5-6 times a day with Imodium every day which sometimes helps.  Has had to use adult briefs due to accidents.  Has lost 5 pounds subjectively.  Poor appetite due to fear of having a bowel movement/accident.  Last colonoscopy unknown amount of time ago interim and thinks it was normal.  No other GI symptoms.  States stool studies were completed and normal but these were not available to [place].  Recommended Bentyl, stool studies, serology, follow-up in 6-8 weeks.  Lipase was normal, TSH normal, pancreatic fecal elastase normal.  Stool sodium, potassium, osmolality was completed and indicated secretory diarrhea.  Indicated likely need for colonoscopy and/or CT exam.  Today she states she's doing so-so. Diarrhea is a little better, still with urgency, still with incontinence. Stools are [facility] 7, has up to 9 a day but on average 2-4. Still taking Bentyl which has helped a little bit. Denies abdominal pain, N/V, hematochezia, melena, GERD, fever, chills, dysphagia, unintentional weight loss. Objectively she is up 3-5 lbs from last visit 4 months ago. Still unsure when she had her colonoscopy last. No records of TCS in Care Everywhere. Denies chest pain, dyspnea, dizziness, lightheadedness, syncope, near syncope. Denies any other upper or lower GI  symptoms.  Doesn't eat much dairy products.  Past Medical History:  Diagnosis Date  . Anxiety   . Asthma   . Depression   . Diabetes (HCC)   . Essential tremor   . Hypertension   . Reflux     Past Surgical History:  Procedure Laterality Date  . ABDOMINAL HYSTERECTOMY    . ANKLE SURGERY Right   . BURR HOLE W/ STEREOTACTIC INSERTION OF DBS LEADS / INTRAOP MICROELECTRODE RECORDING  04/03/2012  . CATARACT EXTRACTION, BILATERAL    . DEEP BRAIN STIMULATOR PLACEMENT     [employer] ACTIVA PC  . EXPLORATORY LAPAROTOMY    . HAND SURGERY Bilateral     Current Outpatient Medications  Medication Sig Dispense Refill  . atorvastatin (LIPITOR) 80 MG tablet Take 1 tablet by mouth daily.    . carbidopa-levodopa (SINEMET IR) 25-100 MG tablet Take 1 tablet by mouth 3 (three) times daily.    . clopidogrel (PLAVIX) 75 MG tablet Take 1 tablet by mouth daily.    Marland Kitchen dicyclomine (BENTYL) 10 MG capsule Take 1 capsule (10 mg total) by mouth 4 (four) times daily as needed (diarrhea). 90 capsule 1  . glipiZIDE (GLUCOTROL) 5 MG tablet Take 2.5 mg by mouth daily.    Marland Kitchen LORazepam (ATIVAN) 1 MG tablet Take 1 mg by mouth every 8 (eight) hours.    . montelukast (SINGULAIR) 10 MG tablet Take 10 mg by mouth at bedtime.     Marland Kitchen omeprazole (PRILOSEC) 20 MG capsule Take 20 mg by mouth daily.  TAKE 1 CAPSULE (20 MG TOTAL) BY MOUTH DAILY.    Marland Kitchen. propranolol (INDERAL) 80 MG tablet Take 80 mg by mouth daily.    . raloxifene (EVISTA) 60 MG tablet Take 60 mg by mouth daily.    Marland Kitchen. topiramate (TOPAMAX) 50 MG tablet Take 1 tablet by mouth at bedtime.    . traMADol (ULTRAM) 50 MG tablet Take 50 mg by mouth 2 (two) times daily.     No current facility-administered medications for this visit.     Allergies as of 04/14/2017 - Review Complete 04/14/2017  Allergen Reaction Noted  . Bupropion Other (See Comments) 04/17/2014  . Codeine Other (See Comments) 04/17/2014  . Sulfa antibiotics Other (See Comments) 04/17/2014  . Tape Other  (See Comments) 04/17/2014  . Trichlormethiazide Other (See Comments) 04/17/2014  . Penicillins Rash 04/17/2014  . Silver Rash 04/17/2014    Family History  Problem Relation Age of Onset  . Colon cancer Neg Hx   . Gastric cancer Neg Hx   . Esophageal cancer Neg Hx     Social History   Socioeconomic History  . Marital status: Single    Spouse name: None  . Number of children: None  . Years of education: None  . Highest education level: None  Social Needs  . Financial resource strain: None  . Food insecurity - worry: None  . Food insecurity - inability: None  . Transportation needs - medical: None  . Transportation needs - non-medical: None  Occupational History  . 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 Smoking status: Former Smoker    Last attempt to quit: 03/10/2012    Years since quitting: 5.0  . Smokeless tobacco: Current User    Types: Snuff  . Tobacco comment: Snuff for about 10 years  Substance and Sexual Activity  . Alcohol use: No    Alcohol/week: 0.0 oz  . Drug use: No  . Sexual activity: None  Other Topics Concern  . None  Social History Narrative  . None    Review of Systems: General: Negative for anorexia, weight loss, fever, chills, fatigue, weakness. ENT: Negative for hoarseness, difficulty swallowing , nasal congestion. CV: Negative for chest pain, angina, palpitations, dyspnea on exertion, peripheral edema.  Respiratory: Negative for dyspnea at rest, dyspnea on exertion, cough, sputum, wheezing.  GI: See history of present illness. Endo: Negative for unusual weight change.  Heme: Negative for bruising or bleeding.  Physical Exam: BP 116/60   Pulse 67   Temp (!) 97 F (36.1 C) (Oral)   Ht 5\' 1"  (1.549 m)   Wt 133 lb 9.6 oz (60.6 kg)   BMI 25.24 kg/m  General:   Alert and oriented. Pleasant and cooperative. Well-nourished and well-developed.  Eyes:  Without icterus, sclera clear and conjunctiva pink.   Ears:  Normal auditory acuity. Cardiovascular:  S1, S2 present without murmurs appreciated. Extremities without clubbing or edema. Respiratory:  Clear to auscultation bilaterally. No wheezes, rales, or rhonchi. No distress.  Gastrointestinal:  +BS, soft, non-tender and non-distended. No HSM noted. No guarding or rebound. No masses appreciated.  Rectal:  Deferred  Musculoskalatal:  Symmetrical without gross deformities. Neurologic:  Alert and oriented x4;  grossly normal neurologically. Psych:  Alert and cooperative. Normal mood and affect. Heme/Lymph/Immune: No excessive bruising noted.    04/14/2017 9:03 AM   Disclaimer: This note was dictated with voice recognition software. Similar sounding words can inadvertently be transcribed and may not be  corrected upon review.

## 2017-04-14 NOTE — Telephone Encounter (Signed)
LMOVM. Pre-op scheduled for 04/29/17 Wed at 2:45pm. Letter mailed.

## 2017-04-14 NOTE — Progress Notes (Signed)
CC'D TO PCP °

## 2017-04-14 NOTE — Assessment & Plan Note (Signed)
Persistent fecal incontinence and urgency.  This is becoming problematic for her life and activities of daily living.  Further workup for diarrhea as per below.  If she continues to have incontinence or urgency she might benefit from manometry.  Follow-up in 3 months.

## 2017-04-14 NOTE — Assessment & Plan Note (Signed)
Persistent chronic diarrhea for about 1 year now.  Despite all our attempts we cannot locate a record of her last colonoscopy.  Regardless, it has not been done since she began having diarrhea.  Her stool studies and workup indicate secretory diarrhea.  At this point we will set her up for colonoscopy and random colon biopsies for further evaluation.  If this is normal we can consider a CT of the abdomen and further testing.  Follow-up in 3 months.  Proceed with TCS on propofol with Dr. Gala Romney in near future: the risks, benefits, and alternatives have been discussed with the patient in detail. The patient states understanding and desires to proceed.  The patient is currently on Plavix, Ativan, tramadol.  No other anticoagulants, anxiolytics, chronic pain medications, or antidepressants.  We will plan for the procedure on propofol/MAC to promote adequate sedation.

## 2017-04-14 NOTE — Patient Instructions (Signed)
EG advised for pt to take 1/2 dose of diabetic medication evening before TCS and none the morning of TCS. Noted on instructions.

## 2017-04-14 NOTE — Patient Instructions (Signed)
1. We will schedule your colonoscopy for you. 2. If your colonoscopy is normal we can talk about next steps including possibly needing to try to obtain a CT scan of your abdomen. 3. Return for follow-up in 3 months. 4. Further recommendations will be made after your colonoscopy. 5. Call us if you have any questions or concerns.

## 2017-04-28 NOTE — Patient Instructions (Signed)
Beth Young  04/28/2017     @PREFPERIOPPHARMACY @   Your procedure is scheduled on  05/04/2017   Report to [facility] at  1130   A.M.  Call this number if you have problems the morning of surgery:  [phone]   Remember:  Do not eat food or drink liquids after midnight.  Take these medicines the morning of surgery with A SIP OF WATER  Sinamet, atarax, singulair, prilosec, Inderal [place], ultram.   Do not wear jewelry, make-up or nail polish.  Do not wear lotions, powders, or perfumes, or deodorant.  Do not shave 48 hours prior to surgery.  Men may shave face and neck.  Do not bring valuables to the hospital.  [facility] is not responsible for any belongings or valuables.  Contacts, dentures or bridgework may not be worn into surgery.  Leave your suitcase in the car.  After surgery it may be brought to your room.  For patients admitted to the hospital, discharge time will be determined by your treatment team.  Patients discharged the day of surgery will not be allowed to drive home.   Name and phone number of your driver:   family Special instructions:  Follow the diet and prep instructions given to you by Dr Luvenia Starchourk's office.  Please read over the following fact sheets that you were given. Anesthesia Post-op Instructions and Care and Recovery After Surgery       Colonoscopy, Adult A colonoscopy is an exam to look at the large intestine. It is done to check for problems, such as:  Lumps (tumors).  Growths (polyps).  Swelling (inflammation).  Bleeding.  What happens before the procedure? Eating and drinking Follow instructions from your doctor about eating and drinking. These instructions may include:  A few days before the procedure - follow a low-fiber diet. ? Avoid nuts. ? Avoid seeds. ? Avoid dried fruit. ? Avoid raw fruits. ? Avoid vegetables.  1-3 days before the procedure - follow a clear liquid diet. Avoid liquids that have red or  purple dye. Drink only clear liquids, such as: ? Clear broth or bouillon. ? Black coffee or tea. ? Clear juice. ? Clear soft drinks or sports drinks. ? Gelatin dessert. ? Popsicles.  On the day of the procedure - do not eat or drink anything during the 2 hours before the procedure.  Bowel prep If you were prescribed an oral bowel prep:  Take it as told by your doctor. Starting the day before your procedure, you will need to drink a lot of liquid. The liquid will cause you to poop (have bowel movements) until your poop is almost clear or light green.  If your skin or butt gets irritated from diarrhea, you may: ? Wipe the area with wipes that have medicine in them, such as adult wet wipes with aloe and vitamin E. ? Put something on your skin that soothes the area, such as petroleum jelly.  If you throw up (vomit) while drinking the bowel prep, take a break for up to 60 minutes. Then begin the bowel prep again. If you keep throwing up and you cannot take the bowel prep without throwing up, call your doctor.  General instructions  Ask your doctor about changing or stopping your normal medicines. This is important if you take diabetes medicines or blood thinners.  Plan to have someone take you home from the hospital or clinic. What happens during the procedure?  An IV tube may be put into one of your veins.  You will be given medicine to help you relax (sedative).  To reduce your risk of infection: ? Your doctors will wash their hands. ? Your anal area will be washed with soap.  You will be asked to lie on your side with your knees bent.  Your doctor will get a long, thin, flexible tube ready. The tube will have a camera and a light on the end.  The tube will be put into your anus.  The tube will be gently put into your large intestine.  Air will be delivered into your large intestine to keep it open. You may feel some pressure or cramping.  The camera will be used to take  photos.  A small tissue sample may be removed from your body to be looked at under a microscope (biopsy). If any possible problems are found, the tissue will be sent to a lab for testing.  If small growths are found, your doctor may remove them and have them checked for cancer.  The tube that was put into your anus will be slowly removed. The procedure may vary among doctors and hospitals. What happens after the procedure?  Your doctor will check on you often until the medicines you were given have worn off.  Do not drive for 24 hours after the procedure.  You may have a small amount of blood in your poop.  You may pass gas.  You may have mild cramps or bloating in your belly (abdomen).  It is up to you to get the results of your procedure. Ask your doctor, or the department performing the procedure, when your results will be ready. This information is not intended to replace advice given to you by your health care provider. Make sure you discuss any questions you have with your health care provider. Document Released: 03/29/2010 Document Revised: 12/26/2015 Document Reviewed: 05/08/2015 Elsevier Interactive Patient Education  2017 [employer]  Colonoscopy, Adult, Care After This sheet gives you information about how to care for yourself after your procedure. Your health care provider may also give you more specific instructions. If you have problems or questions, contact your health care provider. What can I expect after the procedure? After the procedure, it is common to have:  A small amount of blood in your stool for 24 hours after the procedure.  Some gas.  Mild abdominal cramping or bloating.  Follow these instructions at home: General instructions   For the first 24 hours after the procedure: ? Do not drive or use machinery. ? Do not sign important documents. ? Do not drink alcohol. ? Do your regular daily activities at a slower pace than normal. ? Eat soft,  easy-to-digest foods. ? Rest often.  Take over-the-counter or prescription medicines only as told by your health care provider.  It is up to you to get the results of your procedure. Ask your health care provider, or the department performing the procedure, when your results will be ready. Relieving cramping and bloating  Try walking around when you have cramps or feel bloated.  Apply heat to your abdomen as told by your health care provider. Use a heat source that your health care provider recommends, such as a moist heat pack or a heating pad. ? Place a towel between your skin and the heat source. ? Leave the heat on for 20-30 minutes. ? Remove the heat if your skin turns bright red. This is  especially important if you are unable to feel pain, heat, or cold. You may have a greater risk of getting burned. Eating and drinking  Drink enough fluid to keep your urine clear or pale yellow.  Resume your normal diet as instructed by your health care provider. Avoid heavy or fried foods that are hard to digest.  Avoid drinking alcohol for as long as instructed by your health care provider. Contact a health care provider if:  You have blood in your stool 2-3 days after the procedure. Get help right away if:  You have more than a small spotting of blood in your stool.  You pass large blood clots in your stool.  Your abdomen is swollen.  You have nausea or vomiting.  You have a fever.  You have increasing abdominal pain that is not relieved with medicine. This information is not intended to replace advice given to you by your health care provider. Make sure you discuss any questions you have with your health care provider. Document Released: 10/09/2003 Document Revised: 11/19/2015 Document Reviewed: 05/08/2015 Elsevier Interactive Patient Education  2018 [place] Anesthesia is a term that refers to techniques, procedures, and medicines that help a  person stay safe and comfortable during a medical procedure. Monitored anesthesia care, or sedation, is one type of anesthesia. Your anesthesia specialist may recommend sedation if you will be having a procedure that does not require you to be unconscious, such as:  Cataract surgery.  A dental procedure.  A biopsy.  A colonoscopy.  During the procedure, you may receive a medicine to help you relax (sedative). There are three levels of sedation:  Mild sedation. At this level, you may feel awake and relaxed. You will be able to follow directions.  Moderate sedation. At this level, you will be sleepy. You may not remember the procedure.  Deep sedation. At this level, you will be asleep. You will not remember the procedure.  The more medicine you are given, the deeper your level of sedation will be. Depending on how you respond to the procedure, the anesthesia specialist may change your level of sedation or the type of anesthesia to fit your needs. An anesthesia specialist will monitor you closely during the procedure. Let your health care provider know about:  Any allergies you have.  All medicines you are taking, including vitamins, herbs, eye drops, creams, and over-the-counter medicines.  Any use of steroids (by mouth or as a cream).  Any problems you or family members have had with sedatives and anesthetic medicines.  Any blood disorders you have.  Any surgeries you have had.  Any medical conditions you have, such as sleep apnea.  Whether you are pregnant or may be pregnant.  Any use of cigarettes, alcohol, or street drugs. What are the risks? Generally, this is a safe procedure. However, problems may occur, including:  Getting too much medicine (oversedation).  Nausea.  Allergic reaction to medicines.  Trouble breathing. If this happens, a breathing tube may be used to help with breathing. It will be removed when you are awake and breathing on your own.  Heart  trouble.  Lung trouble.  Before the procedure Staying hydrated Follow instructions from your health care provider about hydration, which may include:  Up to 2 hours before the procedure - you may continue to drink clear liquids, such as water, clear fruit juice, black coffee, and plain tea.  Eating and drinking restrictions Follow instructions from your health care provider  about eating and drinking, which may include:  8 hours before the procedure - stop eating heavy meals or foods such as meat, fried foods, or fatty foods.  6 hours before the procedure - stop eating light meals or foods, such as toast or cereal.  6 hours before the procedure - stop drinking milk or drinks that contain milk.  2 hours before the procedure - stop drinking clear liquids.  Medicines Ask your health care provider about:  Changing or stopping your regular medicines. This is especially important if you are taking diabetes medicines or blood thinners.  Taking medicines such as aspirin and ibuprofen. These medicines can thin your blood. Do not take these medicines before your procedure if your health care provider instructs you not to.  Tests and exams  You will have a physical exam.  You may have blood tests done to show: ? How well your kidneys and liver are working. ? How well your blood can clot.  General instructions  Plan to have someone take you home from the hospital or clinic.  If you will be going home right after the procedure, plan to have someone with you for 24 hours.  What happens during the procedure?  Your blood pressure, heart rate, breathing, level of pain and overall condition will be monitored.  An IV tube will be inserted into one of your veins.  Your anesthesia specialist will give you medicines as needed to keep you comfortable during the procedure. This may mean changing the level of sedation.  The procedure will be performed. After the procedure  Your blood  pressure, heart rate, breathing rate, and blood oxygen level will be monitored until the medicines you were given have worn off.  Do not drive for 24 hours if you received a sedative.  You may: ? Feel sleepy, clumsy, or nauseous. ? Feel forgetful about what happened after the procedure. ? Have a sore throat if you had a breathing tube during the procedure. ? Vomit. This information is not intended to replace advice given to you by your health care provider. Make sure you discuss any questions you have with your health care provider. Document Released: 11/20/2004 Document Revised: 08/03/2015 Document Reviewed: 06/17/2015 Elsevier Interactive Patient Education  2018 [place], Care After These instructions provide you with information about caring for yourself after your procedure. Your health care provider may also give you more specific instructions. Your treatment has been planned according to current medical practices, but problems sometimes occur. Call your health care provider if you have any problems or questions after your procedure. What can I expect after the procedure? After your procedure, it is common to:  Feel sleepy for several hours.  Feel clumsy and have poor balance for several hours.  Feel forgetful about what happened after the procedure.  Have poor judgment for several hours.  Feel nauseous or vomit.  Have a sore throat if you had a breathing tube during the procedure.  Follow these instructions at home: For at least 24 hours after the procedure:   Do not: ? Participate in activities in which you could fall or become injured. ? Drive. ? Use heavy machinery. ? Drink alcohol. ? Take sleeping pills or medicines that cause drowsiness. ? Make important decisions or sign legal documents. ? Take care of children on your own.  Rest. Eating and drinking  Follow the diet that is recommended by your health care provider.  If you  vomit, drink water, juice,  or soup when you can drink without vomiting.  Make sure you have little or no nausea before eating solid foods. General instructions  Have a responsible adult stay with you until you are awake and alert.  Take over-the-counter and prescription medicines only as told by your health care provider.  If you smoke, do not smoke without supervision.  Keep all follow-up visits as told by your health care provider. This is important. Contact a health care provider if:  You keep feeling nauseous or you keep vomiting.  You feel light-headed.  You develop a rash.  You have a fever. Get help right away if:  You have trouble breathing. This information is not intended to replace advice given to you by your health care provider. Make sure you discuss any questions you have with your health care provider. Document Released: 06/17/2015 Document Revised: 10/17/2015 Document Reviewed: 06/17/2015 Elsevier Interactive Patient Education  [employer].

## 2017-04-29 ENCOUNTER — Other Ambulatory Visit: Payer: Self-pay

## 2017-04-29 ENCOUNTER — Encounter (HOSPITAL_COMMUNITY): Payer: Self-pay

## 2017-04-29 ENCOUNTER — Encounter (HOSPITAL_COMMUNITY)
Admission: RE | Admit: 2017-04-29 | Discharge: 2017-04-29 | Disposition: A | Discharge status: Home / Self Care | Payer: Medicare Other | Source: Ambulatory Visit | Attending: Internal Medicine | Admitting: Internal Medicine

## 2017-04-29 DIAGNOSIS — Z01818 Encounter for other preprocedural examination: Secondary | ICD-10-CM | POA: Diagnosis not present

## 2017-04-29 DIAGNOSIS — Z01812 Encounter for preprocedural laboratory examination: Secondary | ICD-10-CM | POA: Diagnosis not present

## 2017-04-29 HISTORY — DX: Insomnia, unspecified: G47.00

## 2017-04-29 HISTORY — DX: Parkinson's disease: G20

## 2017-04-29 HISTORY — DX: Parkinson's disease without dyskinesia, without mention of fluctuations: G20.A1

## 2017-04-29 HISTORY — DX: Gastro-esophageal reflux disease without esophagitis: K21.9

## 2017-04-29 HISTORY — DX: Unspecified osteoarthritis, unspecified site: M19.90

## 2017-04-29 LAB — CBC WITH DIFFERENTIAL/PLATELET
BASOS PCT: 1 %
Basophils Absolute: 0.1 10*3/uL (ref 0.0–0.1)
EOS ABS: 0.4 10*3/uL (ref 0.0–0.7)
EOS PCT: 5 %
HCT: 35 % — ABNORMAL LOW (ref 36.0–46.0)
Hemoglobin: 10.6 g/dL — ABNORMAL LOW (ref 12.0–15.0)
LYMPHS ABS: 2.4 10*3/uL (ref 0.7–4.0)
Lymphocytes Relative: 33 %
MCH: 25.4 pg — AB (ref 26.0–34.0)
MCHC: 30.3 g/dL (ref 30.0–36.0)
MCV: 83.9 fL (ref 78.0–100.0)
MONOS PCT: 7 %
Monocytes Absolute: 0.5 10*3/uL (ref 0.1–1.0)
Neutro Abs: 3.8 10*3/uL (ref 1.7–7.7)
Neutrophils Relative %: 54 %
PLATELETS: 198 10*3/uL (ref 150–400)
RBC: 4.17 MIL/uL (ref 3.87–5.11)
RDW: 13.6 % (ref 11.5–15.5)
WBC: 7.1 10*3/uL (ref 4.0–10.5)

## 2017-04-29 LAB — BASIC METABOLIC PANEL
Anion gap: 8 (ref 5–15)
BUN: 10 mg/dL (ref 6–20)
CALCIUM: 8.8 mg/dL — AB (ref 8.9–10.3)
CO2: 22 mmol/L (ref 22–32)
CREATININE: 0.94 mg/dL (ref 0.44–1.00)
Chloride: 111 mmol/L (ref 101–111)
Glucose, Bld: 123 mg/dL — ABNORMAL HIGH (ref 65–99)
Potassium: 3.5 mmol/L (ref 3.5–5.1)
Sodium: 141 mmol/L (ref 135–145)

## 2017-05-04 ENCOUNTER — Ambulatory Visit (HOSPITAL_COMMUNITY): Payer: Medicare Other | Admitting: Anesthesiology

## 2017-05-04 ENCOUNTER — Ambulatory Visit (HOSPITAL_COMMUNITY)
Admission: RE | Admit: 2017-05-04 | Discharge: 2017-05-04 | Disposition: A | Discharge status: Home / Self Care | Payer: Medicare Other | Source: Ambulatory Visit | Attending: Internal Medicine | Admitting: Internal Medicine

## 2017-05-04 ENCOUNTER — Encounter (HOSPITAL_COMMUNITY): Payer: Self-pay | Admitting: Anesthesiology

## 2017-05-04 ENCOUNTER — Encounter (HOSPITAL_COMMUNITY)
Admission: RE | Disposition: A | Discharge status: Home / Self Care | Payer: Self-pay | Source: Ambulatory Visit | Attending: Internal Medicine

## 2017-05-04 DIAGNOSIS — K529 Noninfective gastroenteritis and colitis, unspecified: Secondary | ICD-10-CM | POA: Diagnosis not present

## 2017-05-04 DIAGNOSIS — K219 Gastro-esophageal reflux disease without esophagitis: Secondary | ICD-10-CM | POA: Insufficient documentation

## 2017-05-04 DIAGNOSIS — Z79810 Long term (current) use of selective estrogen receptor modulators (SERMs): Secondary | ICD-10-CM | POA: Insufficient documentation

## 2017-05-04 DIAGNOSIS — Z7984 Long term (current) use of oral hypoglycemic drugs: Secondary | ICD-10-CM | POA: Diagnosis not present

## 2017-05-04 DIAGNOSIS — Z7902 Long term (current) use of antithrombotics/antiplatelets: Secondary | ICD-10-CM | POA: Diagnosis not present

## 2017-05-04 DIAGNOSIS — Z87891 Personal history of nicotine dependence: Secondary | ICD-10-CM | POA: Insufficient documentation

## 2017-05-04 DIAGNOSIS — J45909 Unspecified asthma, uncomplicated: Secondary | ICD-10-CM | POA: Diagnosis not present

## 2017-05-04 DIAGNOSIS — Z79899 Other long term (current) drug therapy: Secondary | ICD-10-CM | POA: Diagnosis not present

## 2017-05-04 DIAGNOSIS — I10 Essential (primary) hypertension: Secondary | ICD-10-CM | POA: Insufficient documentation

## 2017-05-04 DIAGNOSIS — K5730 Diverticulosis of large intestine without perforation or abscess without bleeding: Secondary | ICD-10-CM | POA: Insufficient documentation

## 2017-05-04 DIAGNOSIS — F419 Anxiety disorder, unspecified: Secondary | ICD-10-CM | POA: Diagnosis not present

## 2017-05-04 DIAGNOSIS — R197 Diarrhea, unspecified: Secondary | ICD-10-CM | POA: Diagnosis not present

## 2017-05-04 DIAGNOSIS — E119 Type 2 diabetes mellitus without complications: Secondary | ICD-10-CM | POA: Insufficient documentation

## 2017-05-04 HISTORY — PX: BIOPSY: SHX5522

## 2017-05-04 HISTORY — PX: COLONOSCOPY WITH PROPOFOL: SHX5780

## 2017-05-04 LAB — GLUCOSE, CAPILLARY
GLUCOSE-CAPILLARY: 87 mg/dL (ref 65–99)
GLUCOSE-CAPILLARY: 73 mg/dL (ref 65–99)

## 2017-05-04 SURGERY — COLONOSCOPY WITH PROPOFOL
Anesthesia: Monitor Anesthesia Care

## 2017-05-04 MED ORDER — PROPOFOL 500 MG/50ML IV EMUL
INTRAVENOUS | Status: DC | PRN
  Administered 2017-05-04: 150 ug/kg/min via INTRAVENOUS
  Administered 2017-05-04: 14:00:00 via INTRAVENOUS

## 2017-05-04 MED ORDER — ONDANSETRON 4 MG PO TBDP
4.0000 mg | ORAL_TABLET | Freq: Once | ORAL | Status: AC
  Administered 2017-05-04: 4 mg via ORAL

## 2017-05-04 MED ORDER — MIDAZOLAM HCL 2 MG/2ML IJ SOLN
INTRAMUSCULAR | Status: AC
  Filled 2017-05-04: qty 2

## 2017-05-04 MED ORDER — LACTATED RINGERS IV SOLN
INTRAVENOUS | Status: DC
  Administered 2017-05-04: 12:00:00 via INTRAVENOUS

## 2017-05-04 MED ORDER — ONDANSETRON 4 MG PO TBDP
ORAL_TABLET | ORAL | Status: AC
  Filled 2017-05-04: qty 1

## 2017-05-04 MED ORDER — MIDAZOLAM HCL 2 MG/2ML IJ SOLN
1.0000 mg | Freq: Once | INTRAMUSCULAR | Status: AC | PRN
  Administered 2017-05-04: 2 mg via INTRAVENOUS

## 2017-05-04 MED ORDER — SIMETHICONE 40 MG/0.6ML PO SUSP
ORAL | Status: DC | PRN
  Administered 2017-05-04: 100 mL

## 2017-05-04 NOTE — Transfer of Care (Signed)
Immediate Anesthesia Transfer of Care Note  Patient: Beth Young  Procedure(s) Performed: COLONOSCOPY WITH PROPOFOL (N/A ) BIOPSY  Patient Location: PACU  Anesthesia Type:MAC  Level of Consciousness: awake, drowsy and patient cooperative  Airway & Oxygen Therapy: Patient Spontanous Breathing and Patient connected to face mask oxygen  Post-op Assessment: Report given to RN and Post -op Vital signs reviewed and stable  Post vital signs: Reviewed and stable  Last Vitals:  Vitals:   05/04/17 1131 05/04/17 1155  BP: (!) 153/75   Pulse: 98   Resp: 18 (!) 21  Temp: 36.8 C   SpO2: 99% 95%    Last Pain:  Vitals:   05/04/17 1131  TempSrc: Oral      Patients Stated Pain Goal: 5 (74/14/23 9532)  Complications: No apparent anesthesia complications

## 2017-05-04 NOTE — Op Note (Signed)
[facility] Patient Name: Beth SellsValinda Forde Procedure Date: 05/04/2017 1:35 PM MRN: [id number] Date of Birth: September 14, 1945 Attending MD: Gennette Pacobert Michael Antonie Borjon , MD CSN: [id number] Age: 72 Admit Type: Outpatient Procedure:                Colonoscopy Indications:              Chronic diarrhea Providers:                Gennette Pacobert Michael Harout Scheurich, MD, Edrick Kinsammy Vaught, RN, Dyann Ruddleonya                            Wilson Referring MD:              Medicines:                Propofol per Anesthesia Complications:            No immediate complications. Estimated Blood Loss:     Estimated blood loss was minimal. Procedure:                Pre-Anesthesia Assessment:                           - Prior to the procedure, a History and Physical                            was performed, and patient medications and                            allergies were reviewed. The patient's tolerance of                            previous anesthesia was also reviewed. The risks                            and benefits of the procedure and the sedation                            options and risks were discussed with the patient.                            All questions were answered, and informed consent                            was obtained. Prior Anticoagulants: The patient has                            taken no previous anticoagulant or antiplatelet                            agents. ASA Grade Assessment: II - A patient with                            mild systemic disease. After reviewing the risks  and benefits, the patient was deemed in                            satisfactory condition to undergo the procedure.                           After obtaining informed consent, the colonoscope                            was passed under direct vision. Throughout the                            procedure, the patient's blood pressure, pulse, and                            oxygen saturations were monitored  continuously. The                            EC-[id number] ([id number]) scope was introduced through                            the and advanced to the the cecum, identified by                            appendiceal orifice and ileocecal valve. The                            colonoscopy was performed without difficulty. The                            patient tolerated the procedure well. The quality                            of the bowel preparation was adequate. Anatomical                            landmarks were photographed. Scope In: 1:43:58 PM Scope Out: 2:00:12 PM Scope Withdrawal Time: 0 hours 7 minutes 27 seconds  Total Procedure Duration: 0 hours 16 minutes 14 seconds  Findings:      The perianal and digital rectal examinations were normal.      Scattered medium-mouthed diverticula were found in the sigmoid colon and       descending colon. segmental biopsies of the right and left colon were       taken with cold forceps for histology. Estimated blood loss was minimal.      The exam was otherwise without abnormality on direct and retroflexion       views. I attempted to intubate the termi ileum but was unable to do so. Impression:               - Diverticulosis in the sigmoid colon and in the                            descending colon.                           -  The examination was otherwise normal on direct                            and retroflexion views.                           Status post segmental biopsy. Moderate Sedation:      Moderate (conscious) sedation was personally administered by an       anesthesia professional. The following parameters were monitored: oxygen       saturation, heart rate, blood pressure, respiratory rate, EKG, adequacy       of pulmonary ventilation, and response to care. Total physician       intraservice time was 23 minutes. Recommendation:           - Patient has a contact number available for                            emergencies. The signs  and symptoms of potential                            delayed complications were discussed with the                            patient. Return to normal activities tomorrow.                            Written discharge instructions were provided to the                            patient.                           - Resume previous diet.                           - Continue present medications.                           - Await pathology results.                           - Patient has a contact number available for                            emergencies. The signs and symptoms of potential                            delayed complications were discussed with the                            patient. Return to normal activities tomorrow.                            Written discharge instructions were provided to the  patient.                           - Continue present medications.                           - No repeat colonoscopy due to age.                           - Return to GI office in 2 months. Procedure Code(s):        --- Professional ---                           [phone], Colonoscopy, flexible; with biopsy, single                            or multiple Diagnosis Code(s):        --- Professional ---                           K52.9, Noninfective gastroenteritis and colitis,                            unspecified                           K57.30, Diverticulosis of large intestine without                            perforation or abscess without bleeding CPT copyright 2016 American Medical Association. All rights reserved. The codes documented in this report are preliminary and upon coder review may  be revised to meet current compliance requirements. Gerrit Friends. Chasiti Waddington, MD Gennette Pac, MD 05/04/2017 2:07:25 PM This report has been signed electronically. Number of Addenda: 0

## 2017-05-04 NOTE — Anesthesia Postprocedure Evaluation (Signed)
Anesthesia Post Note  Patient: Beth Young  Procedure(s) Performed: COLONOSCOPY WITH PROPOFOL (N/A ) BIOPSY  Patient location during evaluation: PACU Anesthesia Type: MAC Level of consciousness: awake, oriented and patient cooperative Pain management: pain level controlled Vital Signs Assessment: post-procedure vital signs reviewed and stable Respiratory status: spontaneous breathing and respiratory function stable Cardiovascular status: stable Postop Assessment: no apparent nausea or vomiting Anesthetic complications: no     Last Vitals:  Vitals:   05/04/17 1131 05/04/17 1155  BP: (!) 153/75   Pulse: 98   Resp: 18 (!) 21  Temp: 36.8 C   SpO2: 99% 95%    Last Pain:  Vitals:   05/04/17 1131  TempSrc: Oral                 ADAMS, AMY A

## 2017-05-04 NOTE — Discharge Instructions (Signed)
Colonoscopy Discharge Instructions  Read the instructions outlined below and refer to this sheet in the next few weeks. These discharge instructions provide you with general information on caring for yourself after you leave the hospital. Your doctor may also give you specific instructions. While your treatment has been planned according to the most current medical practices available, unavoidable complications occasionally occur. If you have any problems or questions after discharge, call Dr. Gala Romney at [phone]. ACTIVITY  You may resume your regular activity, but move at a slower pace for the next 24 hours.   Take frequent rest periods for the next 24 hours.   Walking will help get rid of the air and reduce the bloated feeling in your belly (abdomen).   No driving for 24 hours (because of the medicine (anesthesia) used during the test).    Do not sign any important legal documents or operate any machinery for 24 hours (because of the anesthesia used during the test).  NUTRITION  Drink plenty of fluids.   You may resume your normal diet as instructed by your doctor.   Begin with a light meal and progress to your normal diet. Heavy or fried foods are harder to digest and may make you feel sick to your stomach (nauseated).   Avoid alcoholic beverages for 24 hours or as instructed.  MEDICATIONS  You may resume your normal medications unless your doctor tells you otherwise.  WHAT YOU CAN EXPECT TODAY  Some feelings of bloating in the abdomen.   Passage of more gas than usual.   Spotting of blood in your stool or on the toilet paper.  IF YOU HAD POLYPS REMOVED DURING THE COLONOSCOPY:  No aspirin products for 7 days or as instructed.   No alcohol for 7 days or as instructed.   Eat a soft diet for the next 24 hours.  FINDING OUT THE RESULTS OF YOUR TEST Not all test results are available during your visit. If your test results are not back during the visit, make an appointment  with your caregiver to find out the results. Do not assume everything is normal if you have not heard from your caregiver or the medical facility. It is important for you to follow up on all of your test results.  SEEK IMMEDIATE MEDICAL ATTENTION IF:  You have more than a spotting of blood in your stool.   Your belly is swollen (abdominal distention).   You are nauseated or vomiting.   You have a temperature over 101.   You have abdominal pain or discomfort that is severe or gets worse throughout the day.    Diverticulosis information provided  Further recommendations to follow pending review of pathology report     Diverticulosis Diverticulosis is a condition that develops when small pouches (diverticula) form in the wall of the large intestine (colon). The colon is where water is absorbed and stool is formed. The pouches form when the inside layer of the colon pushes through weak spots in the outer layers of the colon. You may have a few pouches or many of them. What are the causes? The cause of this condition is not known. What increases the risk? The following factors may make you more likely to develop this condition:  Being older than age 29. Your risk for this condition increases with age. Diverticulosis is rare among people younger than age 56. By age 52, many people have it.  Eating a low-fiber diet.  Having frequent constipation.  Being overweight.  Not getting enough exercise.  Smoking.  Taking over-the-counter pain medicines, like aspirin and ibuprofen.  Having a family history of diverticulosis.  What are the signs or symptoms? In most people, there are no symptoms of this condition. If you do have symptoms, they may include:  Bloating.  Cramps in the abdomen.  Constipation or diarrhea.  Pain in the lower left side of the abdomen.  How is this diagnosed? This condition is most often diagnosed during an exam for other colon problems. Because  diverticulosis usually has no symptoms, it often cannot be diagnosed independently. This condition may be diagnosed by:  Using a flexible scope to examine the colon (colonoscopy).  Taking an X-ray of the colon after dye has been put into the colon (barium enema).  Doing a CT scan.  How is this treated? You may not need treatment for this condition if you have never developed an infection related to diverticulosis. If you have had an infection before, treatment may include:  Eating a high-fiber diet. This may include eating more fruits, vegetables, and grains.  Taking a fiber supplement.  Taking a live bacteria supplement (probiotic).  Taking medicine to relax your colon.  Taking antibiotic medicines.  Follow these instructions at home:  Drink 6-8 glasses of water or more each day to prevent constipation.  Try not to strain when you have a bowel movement.  If you have had an infection before: ? Eat more fiber as directed by your health care provider or your diet and nutrition specialist (dietitian). ? Take a fiber supplement or probiotic, if your health care provider approves.  Take over-the-counter and prescription medicines only as told by your health care provider.  If you were prescribed an antibiotic, take it as told by your health care provider. Do not stop taking the antibiotic even if you start to feel better.  Keep all follow-up visits as told by your health care provider. This is important. Contact a health care provider if:  You have pain in your abdomen.  You have bloating.  You have cramps.  You have not had a bowel movement in 3 days. Get help right away if:  Your pain gets worse.  Your bloating becomes very bad.  You have a fever or chills, and your symptoms suddenly get worse.  You vomit.  You have bowel movements that are bloody or black.  You have bleeding from your rectum. Summary  Diverticulosis is a condition that develops when small  pouches (diverticula) form in the wall of the large intestine (colon).  You may have a few pouches or many of them.  This condition is most often diagnosed during an exam for other colon problems.  If you have had an infection related to diverticulosis, treatment may include increasing the fiber in your diet, taking supplements, or taking medicines. This information is not intended to replace advice given to you by your health care provider. Make sure you discuss any questions you have with your health care provider. Document Released: 11/22/2003 Document Revised: 01/14/2016 Document Reviewed: 01/14/2016 Elsevier Interactive Patient Education  2017 Elsevier Inc.    PATIENT INSTRUCTIONS POST-ANESTHESIA  IMMEDIATELY FOLLOWING SURGERY:  Do not drive or operate machinery for the first twenty four hours after surgery.  Do not make any important decisions for twenty four hours after surgery or while taking narcotic pain medications or sedatives.  If you develop intractable nausea and vomiting or a severe headache please notify your doctor immediately.  FOLLOW-UP:  Please make  appointment with your surgeon as instructed. You do not need to follow up with anesthesia unless specifically instructed to do so. ° °WOUND CARE INSTRUCTIONS (if applicable):  Keep a dry clean dressing on the anesthesia/puncture wound site if there is drainage.  Once the wound has quit draining you may leave it open to air.  Generally you should leave the bandage intact for twenty four hours unless there is drainage.  If the epidural site drains for more than 36-48 hours please call the anesthesia department. ° °QUESTIONS?:  Please feel free to call your physician or the hospital operator if you have any questions, and they will be happy to assist you.    ° ° ° °

## 2017-05-04 NOTE — Interval H&P Note (Signed)
History and Physical Interval Note:  05/04/2017 12:59 PM  Beth Young  has presented today for surgery, with the diagnosis of diarrhea  The various methods of treatment have been discussed with the patient and family. After consideration of risks, benefits and other options for treatment, the patient has consented to  Procedure(s) with comments: COLONOSCOPY WITH PROPOFOL (N/A) - 1:30pm as a surgical intervention .  The patient's history has been reviewed, patient examined, no change in status, stable for surgery.  I have reviewed the patient's chart and labs.  Questions were answered to the patient's satisfaction.     Abhijay Morriss  No change. Chronic diarrhea. Colonoscopy today per plan.  The risks, benefits, limitations, alternatives and imponderables have been reviewed with the patient. Questions have been answered. All parties are agreeable.

## 2017-05-04 NOTE — Anesthesia Preprocedure Evaluation (Signed)
Anesthesia Evaluation  Patient identified by MRN, date of birth, ID band Patient awake    History of Anesthesia Complications (+) history of anesthetic complications  Airway Mallampati: I       Dental  (+) Teeth Intact   Pulmonary asthma , former smoker,    Pulmonary exam normal breath sounds clear to auscultation       Cardiovascular hypertension, Pt. on medications and Pt. on home beta blockers Normal cardiovascular exam Rhythm:Regular Rate:Normal  29-Apr-2017 14:40:49 [facility] ROUTINE RECORD Sinus rhythm   Neuro/Psych PSYCHIATRIC DISORDERS Anxiety Depression Deep brain stimulator    GI/Hepatic GERD  Medicated and Controlled,  Endo/Other  diabetes, Well Controlled, Type 2  Renal/GU      Musculoskeletal   Abdominal Normal abdominal exam  (+)   Peds  Hematology   Anesthesia Other Findings   Reproductive/Obstetrics                             Anesthesia Physical Anesthesia Plan  ASA: III  Anesthesia Plan: MAC   Post-op Pain Management:    Induction:   PONV Risk Score and Plan:   Airway Management Planned: Simple Face Mask  Additional Equipment:   Intra-op Plan:   Post-operative Plan:   Informed Consent: I have reviewed the patients History and Physical, chart, labs and discussed the procedure including the risks, benefits and alternatives for the proposed anesthesia with the patient or authorized representative who has indicated his/her understanding and acceptance.   Dental advisory given  Plan Discussed with: CRNA  Anesthesia Plan Comments:         Anesthesia Quick Evaluation

## 2017-05-04 NOTE — Anesthesia Procedure Notes (Signed)
Procedure Name: MAC Date/Time: 05/04/2017 1:35 PM Performed by: Andree Elk Amy A, CRNA Pre-anesthesia Checklist: Patient identified, Emergency Drugs available, Suction available, Timeout performed and Patient being monitored Patient Re-evaluated:Patient Re-evaluated prior to induction Oxygen Delivery Method: Non-rebreather mask

## 2017-05-07 ENCOUNTER — Encounter: Payer: Self-pay | Admitting: Internal Medicine

## 2017-05-07 ENCOUNTER — Encounter (HOSPITAL_COMMUNITY): Payer: Self-pay | Admitting: Internal Medicine

## 2017-05-22 DIAGNOSIS — E7800 Pure hypercholesterolemia, unspecified: Secondary | ICD-10-CM | POA: Diagnosis not present

## 2017-05-22 DIAGNOSIS — K219 Gastro-esophageal reflux disease without esophagitis: Secondary | ICD-10-CM | POA: Diagnosis not present

## 2017-05-22 DIAGNOSIS — I69992 Facial weakness following unspecified cerebrovascular disease: Secondary | ICD-10-CM | POA: Diagnosis not present

## 2017-05-22 DIAGNOSIS — I10 Essential (primary) hypertension: Secondary | ICD-10-CM | POA: Diagnosis not present

## 2017-05-22 DIAGNOSIS — I4510 Unspecified right bundle-branch block: Secondary | ICD-10-CM | POA: Diagnosis not present

## 2017-05-22 DIAGNOSIS — Z8673 Personal history of transient ischemic attack (TIA), and cerebral infarction without residual deficits: Secondary | ICD-10-CM | POA: Diagnosis not present

## 2017-05-22 DIAGNOSIS — I6789 Other cerebrovascular disease: Secondary | ICD-10-CM | POA: Diagnosis not present

## 2017-05-22 DIAGNOSIS — Z87891 Personal history of nicotine dependence: Secondary | ICD-10-CM | POA: Diagnosis not present

## 2017-05-22 DIAGNOSIS — I639 Cerebral infarction, unspecified: Secondary | ICD-10-CM | POA: Diagnosis not present

## 2017-05-22 DIAGNOSIS — Z7902 Long term (current) use of antithrombotics/antiplatelets: Secondary | ICD-10-CM | POA: Diagnosis not present

## 2017-05-22 DIAGNOSIS — G43909 Migraine, unspecified, not intractable, without status migrainosus: Secondary | ICD-10-CM | POA: Diagnosis not present

## 2017-05-22 DIAGNOSIS — Z79899 Other long term (current) drug therapy: Secondary | ICD-10-CM | POA: Diagnosis not present

## 2017-05-22 DIAGNOSIS — E119 Type 2 diabetes mellitus without complications: Secondary | ICD-10-CM | POA: Diagnosis not present

## 2017-06-08 DIAGNOSIS — M810 Age-related osteoporosis without current pathological fracture: Secondary | ICD-10-CM | POA: Diagnosis not present

## 2017-06-08 DIAGNOSIS — R0609 Other forms of dyspnea: Secondary | ICD-10-CM | POA: Diagnosis not present

## 2017-06-08 DIAGNOSIS — R0600 Dyspnea, unspecified: Secondary | ICD-10-CM | POA: Diagnosis not present

## 2017-06-08 DIAGNOSIS — Z880 Allergy status to penicillin: Secondary | ICD-10-CM | POA: Diagnosis not present

## 2017-06-08 DIAGNOSIS — E119 Type 2 diabetes mellitus without complications: Secondary | ICD-10-CM | POA: Diagnosis not present

## 2017-06-08 DIAGNOSIS — E860 Dehydration: Secondary | ICD-10-CM | POA: Diagnosis not present

## 2017-06-08 DIAGNOSIS — Z7902 Long term (current) use of antithrombotics/antiplatelets: Secondary | ICD-10-CM | POA: Diagnosis not present

## 2017-06-08 DIAGNOSIS — J45901 Unspecified asthma with (acute) exacerbation: Secondary | ICD-10-CM | POA: Diagnosis not present

## 2017-06-08 DIAGNOSIS — Z79899 Other long term (current) drug therapy: Secondary | ICD-10-CM | POA: Diagnosis not present

## 2017-06-08 DIAGNOSIS — R5381 Other malaise: Secondary | ICD-10-CM | POA: Diagnosis not present

## 2017-06-08 DIAGNOSIS — Z6823 Body mass index (BMI) 23.0-23.9, adult: Secondary | ICD-10-CM | POA: Diagnosis not present

## 2017-06-08 DIAGNOSIS — Z87891 Personal history of nicotine dependence: Secondary | ICD-10-CM | POA: Diagnosis not present

## 2017-06-08 DIAGNOSIS — R51 Headache: Secondary | ICD-10-CM | POA: Diagnosis not present

## 2017-06-08 DIAGNOSIS — R000 Tachycardia, unspecified: Secondary | ICD-10-CM | POA: Diagnosis not present

## 2017-06-08 DIAGNOSIS — K219 Gastro-esophageal reflux disease without esophagitis: Secondary | ICD-10-CM | POA: Diagnosis not present

## 2017-06-08 DIAGNOSIS — E785 Hyperlipidemia, unspecified: Secondary | ICD-10-CM | POA: Diagnosis not present

## 2017-06-08 DIAGNOSIS — J4541 Moderate persistent asthma with (acute) exacerbation: Secondary | ICD-10-CM | POA: Diagnosis not present

## 2017-06-08 DIAGNOSIS — R069 Unspecified abnormalities of breathing: Secondary | ICD-10-CM | POA: Diagnosis not present

## 2017-06-08 DIAGNOSIS — Z8673 Personal history of transient ischemic attack (TIA), and cerebral infarction without residual deficits: Secondary | ICD-10-CM | POA: Diagnosis not present

## 2017-06-08 DIAGNOSIS — R251 Tremor, unspecified: Secondary | ICD-10-CM | POA: Diagnosis not present

## 2017-06-08 DIAGNOSIS — R0603 Acute respiratory distress: Secondary | ICD-10-CM | POA: Diagnosis not present

## 2017-06-08 DIAGNOSIS — I10 Essential (primary) hypertension: Secondary | ICD-10-CM | POA: Diagnosis not present

## 2017-06-08 DIAGNOSIS — Z886 Allergy status to analgesic agent status: Secondary | ICD-10-CM | POA: Diagnosis not present

## 2017-06-08 DIAGNOSIS — R262 Difficulty in walking, not elsewhere classified: Secondary | ICD-10-CM | POA: Diagnosis not present

## 2017-06-08 DIAGNOSIS — R0602 Shortness of breath: Secondary | ICD-10-CM | POA: Diagnosis not present

## 2017-06-08 DIAGNOSIS — E876 Hypokalemia: Secondary | ICD-10-CM | POA: Diagnosis not present

## 2017-06-08 DIAGNOSIS — J449 Chronic obstructive pulmonary disease, unspecified: Secondary | ICD-10-CM | POA: Diagnosis not present

## 2017-06-08 DIAGNOSIS — E872 Acidosis: Secondary | ICD-10-CM | POA: Diagnosis not present

## 2017-06-08 DIAGNOSIS — I169 Hypertensive crisis, unspecified: Secondary | ICD-10-CM | POA: Diagnosis not present

## 2017-06-09 DIAGNOSIS — E872 Acidosis: Secondary | ICD-10-CM | POA: Diagnosis not present

## 2017-06-09 DIAGNOSIS — E1165 Type 2 diabetes mellitus with hyperglycemia: Secondary | ICD-10-CM | POA: Diagnosis not present

## 2017-06-09 DIAGNOSIS — R0602 Shortness of breath: Secondary | ICD-10-CM | POA: Diagnosis not present

## 2017-06-09 DIAGNOSIS — R000 Tachycardia, unspecified: Secondary | ICD-10-CM | POA: Diagnosis not present

## 2017-06-10 DIAGNOSIS — E1165 Type 2 diabetes mellitus with hyperglycemia: Secondary | ICD-10-CM | POA: Diagnosis not present

## 2017-06-10 DIAGNOSIS — R000 Tachycardia, unspecified: Secondary | ICD-10-CM | POA: Diagnosis not present

## 2017-06-10 DIAGNOSIS — E872 Acidosis: Secondary | ICD-10-CM | POA: Diagnosis not present

## 2017-06-10 DIAGNOSIS — R0902 Hypoxemia: Secondary | ICD-10-CM | POA: Diagnosis not present

## 2017-06-11 DIAGNOSIS — J209 Acute bronchitis, unspecified: Secondary | ICD-10-CM | POA: Diagnosis not present

## 2017-06-11 DIAGNOSIS — Z8673 Personal history of transient ischemic attack (TIA), and cerebral infarction without residual deficits: Secondary | ICD-10-CM | POA: Diagnosis not present

## 2017-06-11 DIAGNOSIS — G2581 Restless legs syndrome: Secondary | ICD-10-CM | POA: Diagnosis not present

## 2017-06-11 DIAGNOSIS — J45901 Unspecified asthma with (acute) exacerbation: Secondary | ICD-10-CM | POA: Diagnosis not present

## 2017-06-11 DIAGNOSIS — Z886 Allergy status to analgesic agent status: Secondary | ICD-10-CM | POA: Diagnosis not present

## 2017-06-11 DIAGNOSIS — E860 Dehydration: Secondary | ICD-10-CM | POA: Diagnosis not present

## 2017-06-11 DIAGNOSIS — R5381 Other malaise: Secondary | ICD-10-CM | POA: Diagnosis not present

## 2017-06-11 DIAGNOSIS — Z87891 Personal history of nicotine dependence: Secondary | ICD-10-CM | POA: Diagnosis not present

## 2017-06-11 DIAGNOSIS — R262 Difficulty in walking, not elsewhere classified: Secondary | ICD-10-CM | POA: Diagnosis not present

## 2017-06-11 DIAGNOSIS — E119 Type 2 diabetes mellitus without complications: Secondary | ICD-10-CM | POA: Diagnosis not present

## 2017-06-11 DIAGNOSIS — R000 Tachycardia, unspecified: Secondary | ICD-10-CM | POA: Diagnosis not present

## 2017-06-11 DIAGNOSIS — R51 Headache: Secondary | ICD-10-CM | POA: Diagnosis not present

## 2017-06-11 DIAGNOSIS — R251 Tremor, unspecified: Secondary | ICD-10-CM | POA: Diagnosis not present

## 2017-06-11 DIAGNOSIS — R0609 Other forms of dyspnea: Secondary | ICD-10-CM | POA: Diagnosis not present

## 2017-06-11 DIAGNOSIS — I10 Essential (primary) hypertension: Secondary | ICD-10-CM | POA: Diagnosis not present

## 2017-06-11 DIAGNOSIS — E872 Acidosis: Secondary | ICD-10-CM | POA: Diagnosis not present

## 2017-06-11 DIAGNOSIS — M810 Age-related osteoporosis without current pathological fracture: Secondary | ICD-10-CM | POA: Diagnosis not present

## 2017-06-11 DIAGNOSIS — Z79899 Other long term (current) drug therapy: Secondary | ICD-10-CM | POA: Diagnosis not present

## 2017-06-11 DIAGNOSIS — E785 Hyperlipidemia, unspecified: Secondary | ICD-10-CM | POA: Diagnosis not present

## 2017-06-11 DIAGNOSIS — Z880 Allergy status to penicillin: Secondary | ICD-10-CM | POA: Diagnosis not present

## 2017-06-11 DIAGNOSIS — G250 Essential tremor: Secondary | ICD-10-CM | POA: Diagnosis not present

## 2017-06-11 DIAGNOSIS — R0600 Dyspnea, unspecified: Secondary | ICD-10-CM | POA: Diagnosis not present

## 2017-06-11 DIAGNOSIS — E876 Hypokalemia: Secondary | ICD-10-CM | POA: Diagnosis not present

## 2017-06-11 DIAGNOSIS — K219 Gastro-esophageal reflux disease without esophagitis: Secondary | ICD-10-CM | POA: Diagnosis not present

## 2017-06-11 DIAGNOSIS — I110 Hypertensive heart disease with heart failure: Secondary | ICD-10-CM | POA: Diagnosis not present

## 2017-06-11 DIAGNOSIS — Z7902 Long term (current) use of antithrombotics/antiplatelets: Secondary | ICD-10-CM | POA: Diagnosis not present

## 2017-06-11 DIAGNOSIS — M6281 Muscle weakness (generalized): Secondary | ICD-10-CM | POA: Diagnosis not present

## 2017-06-11 DIAGNOSIS — J449 Chronic obstructive pulmonary disease, unspecified: Secondary | ICD-10-CM | POA: Diagnosis not present

## 2017-06-11 DIAGNOSIS — J45909 Unspecified asthma, uncomplicated: Secondary | ICD-10-CM | POA: Diagnosis not present

## 2017-06-12 DIAGNOSIS — J209 Acute bronchitis, unspecified: Secondary | ICD-10-CM | POA: Diagnosis not present

## 2017-06-12 DIAGNOSIS — R0609 Other forms of dyspnea: Secondary | ICD-10-CM | POA: Diagnosis not present

## 2017-06-30 DIAGNOSIS — J449 Chronic obstructive pulmonary disease, unspecified: Secondary | ICD-10-CM | POA: Diagnosis not present

## 2017-07-02 DIAGNOSIS — Z8673 Personal history of transient ischemic attack (TIA), and cerebral infarction without residual deficits: Secondary | ICD-10-CM | POA: Diagnosis not present

## 2017-07-02 DIAGNOSIS — Z87891 Personal history of nicotine dependence: Secondary | ICD-10-CM | POA: Diagnosis not present

## 2017-07-02 DIAGNOSIS — Z7902 Long term (current) use of antithrombotics/antiplatelets: Secondary | ICD-10-CM | POA: Diagnosis not present

## 2017-07-02 DIAGNOSIS — E785 Hyperlipidemia, unspecified: Secondary | ICD-10-CM | POA: Diagnosis not present

## 2017-07-02 DIAGNOSIS — Z79899 Other long term (current) drug therapy: Secondary | ICD-10-CM | POA: Diagnosis not present

## 2017-07-02 DIAGNOSIS — E119 Type 2 diabetes mellitus without complications: Secondary | ICD-10-CM | POA: Diagnosis not present

## 2017-07-02 DIAGNOSIS — I10 Essential (primary) hypertension: Secondary | ICD-10-CM | POA: Diagnosis not present

## 2017-07-02 DIAGNOSIS — K219 Gastro-esophageal reflux disease without esophagitis: Secondary | ICD-10-CM | POA: Diagnosis not present

## 2017-07-02 DIAGNOSIS — R531 Weakness: Secondary | ICD-10-CM | POA: Diagnosis not present

## 2017-07-02 DIAGNOSIS — R0602 Shortness of breath: Secondary | ICD-10-CM | POA: Diagnosis not present

## 2017-07-02 DIAGNOSIS — R002 Palpitations: Secondary | ICD-10-CM | POA: Diagnosis not present

## 2017-07-02 DIAGNOSIS — R0600 Dyspnea, unspecified: Secondary | ICD-10-CM | POA: Diagnosis not present

## 2017-07-04 DIAGNOSIS — Z8673 Personal history of transient ischemic attack (TIA), and cerebral infarction without residual deficits: Secondary | ICD-10-CM | POA: Diagnosis not present

## 2017-07-04 DIAGNOSIS — R030 Elevated blood-pressure reading, without diagnosis of hypertension: Secondary | ICD-10-CM | POA: Diagnosis not present

## 2017-07-04 DIAGNOSIS — I10 Essential (primary) hypertension: Secondary | ICD-10-CM | POA: Diagnosis not present

## 2017-07-04 DIAGNOSIS — Z79899 Other long term (current) drug therapy: Secondary | ICD-10-CM | POA: Diagnosis not present

## 2017-07-04 DIAGNOSIS — E7800 Pure hypercholesterolemia, unspecified: Secondary | ICD-10-CM | POA: Diagnosis not present

## 2017-07-04 DIAGNOSIS — E119 Type 2 diabetes mellitus without complications: Secondary | ICD-10-CM | POA: Diagnosis not present

## 2017-07-04 DIAGNOSIS — Z87891 Personal history of nicotine dependence: Secondary | ICD-10-CM | POA: Diagnosis not present

## 2017-07-04 DIAGNOSIS — Z7902 Long term (current) use of antithrombotics/antiplatelets: Secondary | ICD-10-CM | POA: Diagnosis not present

## 2017-07-04 DIAGNOSIS — G2581 Restless legs syndrome: Secondary | ICD-10-CM | POA: Diagnosis not present

## 2017-07-04 DIAGNOSIS — K219 Gastro-esophageal reflux disease without esophagitis: Secondary | ICD-10-CM | POA: Diagnosis not present

## 2017-07-10 ENCOUNTER — Other Ambulatory Visit: Payer: Self-pay

## 2017-07-10 ENCOUNTER — Ambulatory Visit: Payer: Medicare Other | Admitting: Cardiology

## 2017-07-10 ENCOUNTER — Encounter: Payer: Self-pay | Admitting: *Deleted

## 2017-07-10 ENCOUNTER — Encounter: Payer: Self-pay | Admitting: Cardiology

## 2017-07-10 ENCOUNTER — Telehealth: Payer: Self-pay | Admitting: Cardiology

## 2017-07-10 VITALS — BP 116/75 | HR 109 | Ht 61.0 in | Wt 131.0 lb

## 2017-07-10 DIAGNOSIS — I10 Essential (primary) hypertension: Secondary | ICD-10-CM

## 2017-07-10 DIAGNOSIS — R0602 Shortness of breath: Secondary | ICD-10-CM

## 2017-07-10 DIAGNOSIS — R0609 Other forms of dyspnea: Secondary | ICD-10-CM

## 2017-07-10 DIAGNOSIS — Z8709 Personal history of other diseases of the respiratory system: Secondary | ICD-10-CM

## 2017-07-10 DIAGNOSIS — E782 Mixed hyperlipidemia: Secondary | ICD-10-CM

## 2017-07-10 NOTE — Patient Instructions (Addendum)
Medication Instructions:  Your physician recommends that you continue on your current medications as directed. Please refer to the Current Medication list given to you today.  Labwork: CBC/TSH Orders given today  Testing/Procedures: Your physician has requested that you have a lexiscan myoview. For further information please visit [email or web address] Please follow instruction sheet, as given.  Follow-Up: Your physician recommends that you schedule a follow-up appointment PENDING TEST RESULTS  Any Other Special Instructions Will Be Listed Below (If Applicable).  If you need a refill on your cardiac medications before your next appointment, please call your pharmacy.

## 2017-07-10 NOTE — Patient Outreach (Signed)
[facility]) Care Management  07/10/2017  ANTHONY ROLAND [birth year] [id number]   Telephone Screen  Referral Date: 07/09/17 Referral Source: MD office -[facility] Referral Reason: " patient having trouble keeping her medications and finances straight" Insurance: [facility] Medicare   Outreach attempt # 1 to patient. No answer at present. RN CM left HIPAA compliant voicemail message along with contact info.      Plan: RN CM will make outreach attempt to patient within 3-4 business days.  RN CM will send unsuccessful outreach letter to patient.    Antionette Fairy, RN,BSN,CCM [facility] Care Management Telephonic Care Management Coordinator Direct Phone: [phone] Toll Free: [phone] Fax: [phone]

## 2017-07-10 NOTE — Telephone Encounter (Signed)
Pre-cert Verification for the following procedure   Lexiscan scheduled for 07-16-2017 at [facility]

## 2017-07-10 NOTE — Progress Notes (Signed)
Cardiology Office Note  Date: 07/10/2017   ID: Hanna, Ra 07/01/1945, MRN [id number]  PCP: Selinda Flavin, MD  Consulting Cardiologist: Nona Dell, MD   Chief Complaint  Patient presents with  . Shortness of Breath    History of Present Illness: Beth Young is a 72 y.o. female referred for cardiology consultation by Dr. Dimas Aguas for the evaluation of shortness of breath.  Records indicate hospitalization at [facility] in April with shortness of breath that was felt to be due to COPD/asthma exacerbation.  She was discharged to rehabilitation and then seen again in the ER with similar symptoms, also palpitations.  Reportedly also had an echocardiogram done at some point, although no report received.  D-dimer and troponin T levels were negative.  She is here with her sister.  She denies having any specific wheezing or cough, no fevers or chills, no asymmetrical leg swelling, no hemoptysis.  Also no clear-cut exertional chest pain.  She states that she fatigues quickly and feels like her heart rate goes up when she moves around.  I reviewed her current medications.  She is on inhalers for treatment of asthma.  She had been out of lisinopril since December preceding the onset of the symptoms but is back on the medication now.  She does feel like she has less short of breath with activity, but her symptoms have not resolved.  She reports family history of heart disease in her father who had "13 heart attacks."  We had her ambulate in the office on pulse oximeter.  Oxygen saturation dropped no lower than 97%.  Past Medical History:  Diagnosis Date  . Anxiety   . Arthritis   . Asthma   . Depression   . GERD (gastroesophageal reflux disease)   . History of stroke   . Hyperlipidemia   . Hypertension   . Insomnia   . Osteoporosis   . 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 Restless legs syndrome   . Type 2 diabetes mellitus  (HCC)     Past Surgical History:  Procedure Laterality Date  . ABDOMINAL HYSTERECTOMY    . ANKLE SURGERY Right   . BIOPSY  05/04/2017   Procedure: BIOPSY;  Surgeon: Corbin Ade, MD;  [facility];  Service: Endoscopy;;  ascending, descending, sigmoid bx's  . BURR HOLE W/ STEREOTACTIC INSERTION OF DBS LEADS / INTRAOP MICROELECTRODE RECORDING  04/03/2012  . CATARACT EXTRACTION, BILATERAL    . COLONOSCOPY WITH PROPOFOL N/A 05/04/2017   Procedure: COLONOSCOPY WITH PROPOFOL;  Surgeon: Corbin Ade, MD;  Location: [facility];  Service: Endoscopy;  Laterality: N/A;  1:30pm  . DEEP BRAIN STIMULATOR PLACEMENT     Medtronic ACTIVA PC  . 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  Marland Kitchen HAND SURGERY Bilateral    joint replacement, bilateral thumbs    Current Outpatient Medications  Medication Sig Dispense Refill  . atorvastatin (LIPITOR) 80 MG tablet Take 80 mg by mouth daily.     . carbidopa-levodopa (SINEMET IR) 25-100 MG tablet Take 1 tablet by mouth 3 (three) times daily.    . clopidogrel (PLAVIX) 75 MG tablet Take 75 mg by mouth daily.     Marland Kitchen dicyclomine (BENTYL) 10 MG capsule Take 1 capsule (10 mg total) by mouth 4 (four) times daily as needed (diarrhea). 90 capsule 1  . doxepin (SINEQUAN) 25 MG capsule Take 25 mg  by mouth at bedtime as needed. For hives/insomnia  2  . hydrOXYzine (ATARAX/VISTARIL) 10 MG tablet Take 10 mg by mouth 3 (three) times daily as needed. Itching.  0  . montelukast (SINGULAIR) 10 MG tablet Take 10 mg by mouth daily.     Marland Kitchen omeprazole (PRILOSEC) 20 MG capsule Take 20 mg by mouth daily. TAKE 1 CAPSULE (20 MG TOTAL) BY MOUTH DAILY.    Marland Kitchen tiotropium (SPIRIVA HANDIHALER) 18 MCG inhalation capsule Place into inhaler and inhale.    . topiramate (TOPAMAX) 50 MG tablet Take 50 mg by mouth at bedtime.     . traMADol (ULTRAM) 50 MG tablet Take 50 mg by mouth 2 (two) times daily.    Marland Kitchen escitalopram (LEXAPRO) 20 MG tablet TAKE 1 TABLET BY  MOUTH DAILY FOR DEPRESSION  2  . lisinopril (PRINIVIL,ZESTRIL) 20 MG tablet Take 20 mg by mouth daily.  1   No current facility-administered medications for this visit.    Allergies:  Bupropion; Codeine; Sulfa antibiotics; Tape; Trichlormethiazide; Penicillins; and Silver   Social History: The patient  reports that she quit smoking about 5 years ago. Her smoking use included cigarettes. She has a 2.50 pack-year smoking history. She has quit using smokeless tobacco. Her smokeless tobacco use included snuff. She reports that she does not drink alcohol or use drugs.   Family History: The patient's family history includes Dementia in her mother; Heart attack in her father.   ROS:  Please see the history of present illness. Otherwise, complete review of systems is positive for none.  All other systems are reviewed and negative.   Physical Exam: VS:  BP 116/75   Pulse (!) 109   Ht  (1.549 m)   Wt 131 lb (59.4 kg)   SpO2 97%   BMI 24.75 kg/m , BMI Body mass index is 24.75 kg/m.  Wt Readings from Last 3 Encounters:  07/10/17 131 lb (59.4 kg)  04/29/17 138 lb (62.6 kg)  04/14/17 133 lb 9.6 oz (60.6 kg)    General: Patient appears comfortable at rest. HEENT: Conjunctiva and lids normal, oropharynx clear. Neck: Supple, no elevated JVP or carotid bruits, no thyromegaly. Lungs: Clear to auscultation, nonlabored breathing at rest. Cardiac: Regular rate and rhythm, no S3 or significant systolic murmur, no pericardial rub. Abdomen: Soft, nontender, bowel sounds present. Extremities: No pitting edema, distal pulses 2+. Skin: Warm and dry. Musculoskeletal: No kyphosis. Neuropsychiatric: Alert and oriented x3, affect grossly appropriate.  ECG: I personally reviewed the tracing from 04/29/2017 which showed sinus rhythm with Q waves in lead III, possibly normal variant.  Recent Labwork: 12/11/2016: TSH 1.208 04/29/2017: BUN 10; Creatinine, Ser 0.94; Hemoglobin 10.6; Platelets 198; Potassium  3.5; Sodium 141   Assessment and Plan:  1.  Dyspnea on exertion and rapid heart rate.  Etiology is not yet clear.  She did not have evidence of ambulatory oxygen desaturation on assessment today in the office.  Plan is to obtain her echocardiogram report from [facility].  Also obtain CBC and TSH.  We will follow through with ischemic evaluation as well via Lexiscan Myoview in light of her family history.  2.  Essential hypertension, now back on lisinopril.  Blood pressure is normal today.  3.  Reported history of asthma, on inhalers.  She is not wheezing at present time.  4.  Mixed hyperlipidemia, on Lipitor.  Current medicines were reviewed with the patient today.   Orders Placed This Encounter  Procedures  . NM Myocar  Multi W/Spect W/Wall Motion / EF  . CBC  . TSH    Disposition: Call with test results.  Signed, Jonelle Sidle, MD, [facility] 07/10/2017 3:17 PM    [facility] [address] [place], [place], [place] [address] Phone: [phone]; Fax: [phone]

## 2017-07-12 DIAGNOSIS — Z9181 History of falling: Secondary | ICD-10-CM | POA: Diagnosis not present

## 2017-07-12 DIAGNOSIS — Z87891 Personal history of nicotine dependence: Secondary | ICD-10-CM | POA: Diagnosis not present

## 2017-07-12 DIAGNOSIS — J441 Chronic obstructive pulmonary disease with (acute) exacerbation: Secondary | ICD-10-CM | POA: Diagnosis not present

## 2017-07-12 DIAGNOSIS — Z7951 Long term (current) use of inhaled steroids: Secondary | ICD-10-CM | POA: Diagnosis not present

## 2017-07-12 DIAGNOSIS — M810 Age-related osteoporosis without current pathological fracture: Secondary | ICD-10-CM | POA: Diagnosis not present

## 2017-07-12 DIAGNOSIS — E119 Type 2 diabetes mellitus without complications: Secondary | ICD-10-CM | POA: Diagnosis not present

## 2017-07-12 DIAGNOSIS — I10 Essential (primary) hypertension: Secondary | ICD-10-CM | POA: Diagnosis not present

## 2017-07-12 DIAGNOSIS — G2581 Restless legs syndrome: Secondary | ICD-10-CM | POA: Diagnosis not present

## 2017-07-12 DIAGNOSIS — I69354 Hemiplegia and hemiparesis following cerebral infarction affecting left non-dominant side: Secondary | ICD-10-CM | POA: Diagnosis not present

## 2017-07-12 DIAGNOSIS — Z7902 Long term (current) use of antithrombotics/antiplatelets: Secondary | ICD-10-CM | POA: Diagnosis not present

## 2017-07-13 ENCOUNTER — Other Ambulatory Visit: Payer: Self-pay

## 2017-07-13 DIAGNOSIS — R0602 Shortness of breath: Secondary | ICD-10-CM | POA: Diagnosis not present

## 2017-07-13 DIAGNOSIS — R0609 Other forms of dyspnea: Secondary | ICD-10-CM | POA: Diagnosis not present

## 2017-07-13 NOTE — Patient Outreach (Signed)
[facility]) Care Management  07/13/2017  KAJAL SCALICI 1945/06/16 [id number]    Telephone Screen  Referral Date: 07/09/17 Referral Source: MD office -[facility] Referral Reason: " patient having trouble keeping her medications and finances straight" Insurance: [facility] Medicare    Outreach attempt #2 to patient. No answer at present.    Plan: RN CM will make outreach attempt to patient within 3-4 business days.  Antionette Fairy, RN,BSN,CCM [facility] Care Management Telephonic Care Management Coordinator Direct Phone: [phone] Toll Free: [phone] Fax: [phone]

## 2017-07-14 ENCOUNTER — Telehealth: Payer: Self-pay

## 2017-07-14 NOTE — Telephone Encounter (Signed)
Patient notified. Routed to PCP 

## 2017-07-14 NOTE — Telephone Encounter (Signed)
-----   Message from Jonelle Sidle, MD sent at 07/13/2017 10:33 AM EDT ----- Results reviewed.  Hemoglobin and TSH are normal.  Please forward copy to Dr. Dimas Aguas for further review. A copy of this test should be forwarded to Selinda Flavin, MD.

## 2017-07-15 ENCOUNTER — Ambulatory Visit: Payer: Medicare Other | Admitting: Nurse Practitioner

## 2017-07-15 ENCOUNTER — Other Ambulatory Visit: Payer: Self-pay

## 2017-07-15 NOTE — Patient Outreach (Signed)
[facility]) Care Management  07/15/2017  Beth Young 04-28-1945 [id number]    Telephone Screen  Referral Date:07/09/17 Referral Source:MD office -[facility] Referral Reason:" patient having trouble keeping her medications and finances straight" Insurance:[employer] Medicare    Outreach attempt #3 to patient. Spoke with patient and screening completed.  Social: Patient resides in her home alone. She states that she is independent with ADLs/IADLs. She drives herself to her appts or her sister takes her. Patient denies any other falls. DME in the home include rollator and cbg meter.Patient denies any issues being able to manage her finances.   Conditions: Patient has PMH of HTN, major depressive disorder, tremors, asthma and diabetes. She voices that she was taken off of her diabetes meds as her cbgs have been well controlled. Last A1C was 5.9(Nov 2018). She states that she does occasionally check her blood sugars and they remain normal. She reports that BP remains controlled.    Medications: Patient states that she is taking about 15 meds. She voice that she is "taking a lot of meds and don't know they are for." She states that she went without her BP meds for some time as she did not even realize she was out of it. Patient states that she is filling her med planner or her own. She voices that she could use some assistance managing her meds.   Appointments: Patient is followed by PCP. She is voices that she is also supposed to see a cardiologist.   Consent: [facility] services reviewed and discussed with patient. Verbal consent received from patient. She denies any SW and RN needs or concerns at this time.   Plan: RN CM will send [facility] pharmacy referral for polypharmacy med review and medication mgmt assistance.    Antionette Fairy, RN,BSN,CCM [facility] Care Management Telephonic Care Management Coordinator Direct Phone: [phone] Toll Free:  [phone] Fax: [phone]

## 2017-07-16 ENCOUNTER — Encounter (HOSPITAL_COMMUNITY): Payer: Self-pay

## 2017-07-16 ENCOUNTER — Encounter (HOSPITAL_COMMUNITY)
Admission: RE | Admit: 2017-07-16 | Discharge: 2017-07-16 | Disposition: A | Discharge status: Home / Self Care | Payer: Medicare Other | Source: Ambulatory Visit | Attending: Cardiology | Admitting: Cardiology

## 2017-07-16 ENCOUNTER — Other Ambulatory Visit: Payer: Self-pay

## 2017-07-16 ENCOUNTER — Encounter (HOSPITAL_BASED_OUTPATIENT_CLINIC_OR_DEPARTMENT_OTHER)
Admission: RE | Admit: 2017-07-16 | Discharge: 2017-07-16 | Disposition: A | Discharge status: Home / Self Care | Payer: Medicare Other | Source: Ambulatory Visit | Attending: Cardiology | Admitting: Cardiology

## 2017-07-16 DIAGNOSIS — R0609 Other forms of dyspnea: Secondary | ICD-10-CM | POA: Insufficient documentation

## 2017-07-16 LAB — NM MYOCAR MULTI W/SPECT W/WALL MOTION / EF
LV dias vol: 33 mL (ref 46–106)
LV sys vol: 9 mL
Peak HR: 118 {beats}/min
RATE: 0.4
Rest HR: 83 {beats}/min
SDS: 2
SRS: 2
SSS: 4
TID: 0.87

## 2017-07-16 MED ORDER — SODIUM CHLORIDE 0.9% FLUSH
INTRAVENOUS | Status: AC
  Administered 2017-07-16: 10 mL via INTRAVENOUS
  Filled 2017-07-16: qty 10

## 2017-07-16 MED ORDER — TECHNETIUM TC 99M TETROFOSMIN IV KIT
30.0000 | PACK | Freq: Once | INTRAVENOUS | Status: AC | PRN
  Administered 2017-07-16: 33 via INTRAVENOUS

## 2017-07-16 MED ORDER — TECHNETIUM TC 99M TETROFOSMIN IV KIT
10.0000 | PACK | Freq: Once | INTRAVENOUS | Status: AC | PRN
  Administered 2017-07-16: 10.45 via INTRAVENOUS

## 2017-07-16 MED ORDER — REGADENOSON 0.4 MG/5ML IV SOLN
INTRAVENOUS | Status: AC
  Administered 2017-07-16: 0.4 mg via INTRAVENOUS
  Filled 2017-07-16: qty 5

## 2017-07-16 NOTE — Patient Outreach (Signed)
[facility]) Care Management  07/16/2017  Beth Young 1945-11-28 [id number]  72 year old female referred to [facility] Care Management by MD office for polypharmacy.  [facility] services requested for polypharmacy medication review and medication management assistance.  PMHx includes, but not limited to, acid reflux, Type 2 diabetes mellitus, hyperlipidemia, essential tremor, restless leg and asthma.  Unsuccessful outreach attempt #1 to [name].  Left HIPAA compliant voice message requesting return call.  Plan: Outreach attempt #2 on Monday 5/13.   Berlin Hun, PharmD Clinical Pharmacist [facility] [phone]

## 2017-07-17 ENCOUNTER — Telehealth: Payer: Self-pay

## 2017-07-17 NOTE — Telephone Encounter (Signed)
-----   Message from Jonelle Sidle, MD sent at 07/16/2017  2:44 PM EDT ----- Results reviewed.  Stress test showed no evidence of ischemia and was low risk with normal LVEF.  At this point I do not anticipate further cardiac testing to investigate her symptoms based on assessment so far.  She should keep follow-up with Dr. Dimas Aguas. A copy of this test should be forwarded to Selinda Flavin, MD.

## 2017-07-17 NOTE — Telephone Encounter (Signed)
Patient notified. Routed to PCP 

## 2017-07-20 ENCOUNTER — Other Ambulatory Visit: Payer: Self-pay

## 2017-07-20 NOTE — Patient Outreach (Addendum)
[facility]) Care Management  [facility] Pharmacy   07/20/2017  SINTIA MCKISSIC [birth year] [id number]  72 year old female referred to [facility] Care Management by physician's office for medication management.  [facility] services requested for polypharmacy medication review.  PMHx includes, but not limited to, chronic airflow limitation, acid reflux, Type 2 diabetes mellitus, essential tremor, osteoporosis, tremor, and hyperlipidemia.  Successful outreach attempt to Ms. Burback.  HIPAA identifiers verified.   Subjective: Ms. Lafosse reports that she is doing well.  She states that she has been hospitalized recently at [facility]), but could not tell me when or for what.  She also states that she recently had some issues with elevated blood pressure because she did not realize that she had not been taking her medication for 3 months.  She reports that she does not know what happened with her blood pressure medication and that she takes so many other prescriptions that she did not realize that she wasn't taking it.  She states that she is able to afford her medications and if she has trouble, her sister will "help her out."  She states that she fills her own pill box and someone at the doctor's office has written what the medications are for on the label to help her keep them straight.  Patient reports that her physician took her off of her diabetes medicine "a long time ago."  Objective:  HgA1c 5.9% on 01/16/17 SCr 0.94mg /dL on 06/17/79  Current Medications: Current Outpatient Medications  Medication Sig Dispense Refill  . albuterol (PROVENTIL HFA;VENTOLIN HFA) 108 (90 Base) MCG/ACT inhaler Inhale 1 puff into the lungs every 6 (six) hours as needed for wheezing or shortness of breath.    Marland Kitchen atorvastatin (LIPITOR) 80 MG tablet Take 80 mg by mouth daily.     . carbidopa-levodopa (SINEMET IR) 25-100 MG tablet Take 1 tablet by mouth 3 (three) times daily.    . clopidogrel (PLAVIX) 75 MG tablet  Take 75 mg by mouth daily.     Marland Kitchen doxepin (SINEQUAN) 25 MG capsule Take 25 mg by mouth at bedtime as needed. For hives/insomnia  2  . escitalopram (LEXAPRO) 20 MG tablet TAKE 1 TABLET BY MOUTH DAILY FOR DEPRESSION  2  . fluticasone (FLONASE) 50 MCG/ACT nasal spray Place 2 sprays into both nostrils daily as needed for allergies or rhinitis.    Marland Kitchen lisinopril (PRINIVIL,ZESTRIL) 20 MG tablet Take 20 mg by mouth daily.  1  . LORazepam (ATIVAN) 1 MG tablet Take 0.5-1 mg by mouth every 6 (six) hours as needed (severe tremors).    . montelukast (SINGULAIR) 10 MG tablet Take 10 mg by mouth daily.     Marland Kitchen omeprazole (PRILOSEC) 20 MG capsule Take 20 mg by mouth daily. TAKE 1 CAPSULE (20 MG TOTAL) BY MOUTH DAILY.    Marland Kitchen propranolol (INDERAL) 40 MG tablet Take 40 mg by mouth daily as needed (for severe tremors from migraines). Takes infrequently    . raloxifene (EVISTA) 60 MG tablet Take 60 mg by mouth daily.    Marland Kitchen tiotropium (SPIRIVA HANDIHALER) 18 MCG inhalation capsule Place into inhaler and inhale.    . topiramate (TOPAMAX) 50 MG tablet Take 50 mg by mouth at bedtime.      No current facility-administered medications for this visit.     Functional Status: In your present state of health, do you have any difficulty performing the following activities: 04/29/2017  Hearing? N  Vision? N  Difficulty concentrating or making decisions?  N  Walking or climbing stairs? Y  Dressing or bathing? Y  Doing errands, shopping? N  Some recent data might be hidden    Fall/Depression Screening: No flowsheet data found. PHQ 2/9 Scores 07/15/2017  PHQ - 2 Score 0    Assessment: I was unable to local records of recent hospitalizations in Epic.  I reviewed [facility] for her medications, labs and problem list.     I was able to do a medication review with Ms. Runkles.   She had a couple of prescriptions filled at [facility] and most [facility].  Having two pharmacies is causing her confusion.  I educated patient about the  option of compliance packing for her medications and she thought that would be a great idea and it would take a lot of pressure off of herself.   Will send note to doctor's office requesting clarification of the following issues.   1.  Please review the medication list for accuracy and completeness.  2.  Is Ms. Flansburg supposed to be checking her blood glucose or taking any medications for diabetes?  3.  Patient reports taking Propanolol 40 mg for tremor from migraines.   From continuity of care document 07/03/17 at [facility], there is a prescription for Propranolol ER 80 mg capsule.  Take 1 capsule daily for high blood pressure and control of tremor.   Propranolol 80 mg not reported as filled at [facility] or [facility] and patient does not have in her possession.   4. Patient reports that she does not use tramadol, but continuity of care document mentioned previously shows a start date of 07/03/17 for Tramadol 50 mg.  Take 1 tablet twice daily for headache.  5.  Please send new prescriptions for all of patients current medications to [facility] in [place] to begin compliance packaging.   I spoke with Chastity at Dr. Jeannette How office to inform her of my questions and to ask her to please look for my incoming fax.   PLAN: Follow up with PCP, Dr. Dimas Aguas about new prescriptions for compliance packaging.   Update Ms. Andre about progress of compliance packaging on Friday 5/17 .   Berlin Hun, PharmD Clinical Pharmacist [facility] [phone]  Addendum: Incoming phone call received from [facility], [facility] who is Ms. Rodd's PCP.  She states that Ms. 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    Per PA: Ms. Behne is to take one propranolol 40 mg and one lorazepam 1 mg with the onset of the migraine.  If no response in 1 hour, she is to go to the Emergency Room.    Ms. Edge is to take  propranolol ER 80 mg - 1 capsule daily for blood pressure.  Patient is not prescribed anything for diabetes mellitus at this time.  Patient can have tramadol prn for headaches.    Plan: A. Allwardt, PA- office will reach out to Ms. Hey to ensure that she understands she needs to take the propranolol ER 80 mg once daily for blood pressure control.  A. Allwardt, PA- will call in new prescriptions to [facility] in [place] to initiate compliance packaging   I will follow up with patient and pharmacy on Friday 5/17 to see if there are further questions.  Berlin Hun, PharmD Clinical Pharmacist [facility] [phone]

## 2017-07-21 DIAGNOSIS — G2581 Restless legs syndrome: Secondary | ICD-10-CM | POA: Diagnosis not present

## 2017-07-21 DIAGNOSIS — I10 Essential (primary) hypertension: Secondary | ICD-10-CM | POA: Diagnosis not present

## 2017-07-21 DIAGNOSIS — Z7902 Long term (current) use of antithrombotics/antiplatelets: Secondary | ICD-10-CM | POA: Diagnosis not present

## 2017-07-21 DIAGNOSIS — E119 Type 2 diabetes mellitus without complications: Secondary | ICD-10-CM | POA: Diagnosis not present

## 2017-07-21 DIAGNOSIS — M810 Age-related osteoporosis without current pathological fracture: Secondary | ICD-10-CM | POA: Diagnosis not present

## 2017-07-21 DIAGNOSIS — J441 Chronic obstructive pulmonary disease with (acute) exacerbation: Secondary | ICD-10-CM | POA: Diagnosis not present

## 2017-07-21 DIAGNOSIS — Z9181 History of falling: Secondary | ICD-10-CM | POA: Diagnosis not present

## 2017-07-21 DIAGNOSIS — Z87891 Personal history of nicotine dependence: Secondary | ICD-10-CM | POA: Diagnosis not present

## 2017-07-21 DIAGNOSIS — Z7951 Long term (current) use of inhaled steroids: Secondary | ICD-10-CM | POA: Diagnosis not present

## 2017-07-21 DIAGNOSIS — I69354 Hemiplegia and hemiparesis following cerebral infarction affecting left non-dominant side: Secondary | ICD-10-CM | POA: Diagnosis not present

## 2017-07-24 ENCOUNTER — Ambulatory Visit: Payer: Self-pay

## 2017-07-24 ENCOUNTER — Other Ambulatory Visit: Payer: Self-pay

## 2017-07-24 NOTE — Patient Outreach (Signed)
[facility]) Care Management  07/24/2017  TAYLOUR LIETZKE 04/28/1945 [id number]  72 year old female referred to [facility] Care Management by physician's office for medication management.  [facility] services requested for polypharmacy medication review.  PMHx includes, but not limited to, chronic airflow limitation, acid reflux, Type 2 diabetes mellitus, essential tremor, osteoporosis, tremor, and hyperlipidemia.  Unsuccessful outreach attempt to Ms. Kukla.  Left HIPAA compliant voice message requesting return call.    Incoming call received from Ms. Fullenwider.  HIPAA identifiers verified.  Medication Management: Spoke with [facility] today and they have all new prescriptions on file for her current Epic medication list except, albuterol MDI, doxepin and lisinopril.  I will reach out to NP to verify if patient is to continue these medications and ask that she call in new prescriptions if needed.  [facility] states that Ms. Pettus's medications are not eligible for refill until June 5th, due to last fill of 90 day supply.    Telephone outreach call to  A. Allwardt, NP's office to see  if she wants to call in scripts for albuterol MDI, doxepin and lisinopril to [facility].  Ms. Ki reports that she needs several of her medications refilled.  Made three way phone call to [facility] pharmacy asking for refills for patient.  Patient reports that when she picks up her prescriptions she will take all of her bottles for the pharmacist to look at and discard of unwanted medications.  I counseled Ms. Gowell of the importance of only using [facility] and not [facility] or [facility] since she is starting compliance packaging.    Plan: Follow up with patient on in 2 weeks to see how she is doing and if she has anymore questions about compliance packs.  See if I need to make home visit when compliance packs arrive.   Berlin Hun, PharmD Clinical Pharmacist [facility] [phone]  Addendum: Incoming call received from A. Allwardt, NP's office.  Nurse practitionier reports that Ms. Farabee should discontinue lisinopril and doxepin.  Office reports they will call in a new albuterol prescription to [facility].    Informed patient of these changes.  She states that she does not have any lisinopril in home, but has some doxepin that she will take to [facility] for disposal.   Berlin Hun, PharmD Clinical Pharmacist [facility] [phone]

## 2017-07-28 DIAGNOSIS — M810 Age-related osteoporosis without current pathological fracture: Secondary | ICD-10-CM | POA: Diagnosis not present

## 2017-07-28 DIAGNOSIS — Z7951 Long term (current) use of inhaled steroids: Secondary | ICD-10-CM | POA: Diagnosis not present

## 2017-07-28 DIAGNOSIS — J441 Chronic obstructive pulmonary disease with (acute) exacerbation: Secondary | ICD-10-CM | POA: Diagnosis not present

## 2017-07-28 DIAGNOSIS — Z9181 History of falling: Secondary | ICD-10-CM | POA: Diagnosis not present

## 2017-07-28 DIAGNOSIS — I69354 Hemiplegia and hemiparesis following cerebral infarction affecting left non-dominant side: Secondary | ICD-10-CM | POA: Diagnosis not present

## 2017-07-28 DIAGNOSIS — G2581 Restless legs syndrome: Secondary | ICD-10-CM | POA: Diagnosis not present

## 2017-07-28 DIAGNOSIS — Z87891 Personal history of nicotine dependence: Secondary | ICD-10-CM | POA: Diagnosis not present

## 2017-07-28 DIAGNOSIS — E119 Type 2 diabetes mellitus without complications: Secondary | ICD-10-CM | POA: Diagnosis not present

## 2017-07-28 DIAGNOSIS — I10 Essential (primary) hypertension: Secondary | ICD-10-CM | POA: Diagnosis not present

## 2017-07-28 DIAGNOSIS — Z7902 Long term (current) use of antithrombotics/antiplatelets: Secondary | ICD-10-CM | POA: Diagnosis not present

## 2017-07-30 DIAGNOSIS — I69354 Hemiplegia and hemiparesis following cerebral infarction affecting left non-dominant side: Secondary | ICD-10-CM | POA: Diagnosis not present

## 2017-07-30 DIAGNOSIS — R0600 Dyspnea, unspecified: Secondary | ICD-10-CM | POA: Diagnosis not present

## 2017-07-30 DIAGNOSIS — I10 Essential (primary) hypertension: Secondary | ICD-10-CM | POA: Diagnosis not present

## 2017-07-30 DIAGNOSIS — R51 Headache: Secondary | ICD-10-CM | POA: Diagnosis not present

## 2017-07-30 DIAGNOSIS — Z6824 Body mass index (BMI) 24.0-24.9, adult: Secondary | ICD-10-CM | POA: Diagnosis not present

## 2017-07-30 DIAGNOSIS — G250 Essential tremor: Secondary | ICD-10-CM | POA: Diagnosis not present

## 2017-08-05 DIAGNOSIS — Z9181 History of falling: Secondary | ICD-10-CM | POA: Diagnosis not present

## 2017-08-05 DIAGNOSIS — Z7951 Long term (current) use of inhaled steroids: Secondary | ICD-10-CM | POA: Diagnosis not present

## 2017-08-05 DIAGNOSIS — E119 Type 2 diabetes mellitus without complications: Secondary | ICD-10-CM | POA: Diagnosis not present

## 2017-08-05 DIAGNOSIS — J441 Chronic obstructive pulmonary disease with (acute) exacerbation: Secondary | ICD-10-CM | POA: Diagnosis not present

## 2017-08-05 DIAGNOSIS — Z7902 Long term (current) use of antithrombotics/antiplatelets: Secondary | ICD-10-CM | POA: Diagnosis not present

## 2017-08-05 DIAGNOSIS — I10 Essential (primary) hypertension: Secondary | ICD-10-CM | POA: Diagnosis not present

## 2017-08-05 DIAGNOSIS — G2581 Restless legs syndrome: Secondary | ICD-10-CM | POA: Diagnosis not present

## 2017-08-05 DIAGNOSIS — M810 Age-related osteoporosis without current pathological fracture: Secondary | ICD-10-CM | POA: Diagnosis not present

## 2017-08-05 DIAGNOSIS — Z87891 Personal history of nicotine dependence: Secondary | ICD-10-CM | POA: Diagnosis not present

## 2017-08-05 DIAGNOSIS — I69354 Hemiplegia and hemiparesis following cerebral infarction affecting left non-dominant side: Secondary | ICD-10-CM | POA: Diagnosis not present

## 2017-08-06 ENCOUNTER — Other Ambulatory Visit: Payer: Self-pay

## 2017-08-06 ENCOUNTER — Ambulatory Visit: Payer: Self-pay

## 2017-08-06 NOTE — Patient Outreach (Signed)
[facility]) Care Management  08/06/2017  LAPORCHA MARCHESI 09-03-1945 [id number]  72 year old female referred to [facility] Care Management by physician's office for medication management. [facility] services requested for polypharmacy medication review. PMHx includes, but not limited to, chronic airflow limitation, acid reflux, Type 2 diabetes mellitus, essential tremor, osteoporosis, tremor, and hyperlipidemia.  Successful outreach attempt to Ms. Sedivy.  HIPAA identifiers verified.  Subjective: Ms. Faraone reports that she has started receiving compliance pill packs from [facility] and that they "work real good."  She said everything is going great and she "lovesImmunologist.   She reports that she goes there and picks up the pill packs.  She states that she has no questions or concerns about her medications.   Assessment: Patient was able to correctly verbalize to me how she is using her pill packs.  Counseled her to only use one pharmacy because if she goes to others then the medications won't be in her pill packs.  She verbalized understanding and said [facility].   Informed Ms. Yeargan that I am closing her [facility] case at this time because she has no other medication issues.  She is aware that she can call me back in the future should medications needs arise.   Plan: Route case closure letter to Ms. Dobosz.  Route case closure letter to PCP, A. Allwardt, PA.   Berlin Hun, PharmD Clinical Pharmacist [facility] [phone]

## 2017-08-12 DIAGNOSIS — J441 Chronic obstructive pulmonary disease with (acute) exacerbation: Secondary | ICD-10-CM | POA: Diagnosis not present

## 2017-08-12 DIAGNOSIS — E119 Type 2 diabetes mellitus without complications: Secondary | ICD-10-CM | POA: Diagnosis not present

## 2017-08-12 DIAGNOSIS — Z87891 Personal history of nicotine dependence: Secondary | ICD-10-CM | POA: Diagnosis not present

## 2017-08-12 DIAGNOSIS — I10 Essential (primary) hypertension: Secondary | ICD-10-CM | POA: Diagnosis not present

## 2017-08-12 DIAGNOSIS — I69354 Hemiplegia and hemiparesis following cerebral infarction affecting left non-dominant side: Secondary | ICD-10-CM | POA: Diagnosis not present

## 2017-08-12 DIAGNOSIS — Z7951 Long term (current) use of inhaled steroids: Secondary | ICD-10-CM | POA: Diagnosis not present

## 2017-08-12 DIAGNOSIS — M810 Age-related osteoporosis without current pathological fracture: Secondary | ICD-10-CM | POA: Diagnosis not present

## 2017-08-12 DIAGNOSIS — Z7902 Long term (current) use of antithrombotics/antiplatelets: Secondary | ICD-10-CM | POA: Diagnosis not present

## 2017-08-12 DIAGNOSIS — Z9181 History of falling: Secondary | ICD-10-CM | POA: Diagnosis not present

## 2017-08-12 DIAGNOSIS — G2581 Restless legs syndrome: Secondary | ICD-10-CM | POA: Diagnosis not present

## 2017-08-21 DIAGNOSIS — I69354 Hemiplegia and hemiparesis following cerebral infarction affecting left non-dominant side: Secondary | ICD-10-CM | POA: Diagnosis not present

## 2017-08-21 DIAGNOSIS — Z9181 History of falling: Secondary | ICD-10-CM | POA: Diagnosis not present

## 2017-08-21 DIAGNOSIS — I10 Essential (primary) hypertension: Secondary | ICD-10-CM | POA: Diagnosis not present

## 2017-08-21 DIAGNOSIS — J441 Chronic obstructive pulmonary disease with (acute) exacerbation: Secondary | ICD-10-CM | POA: Diagnosis not present

## 2017-08-21 DIAGNOSIS — Z87891 Personal history of nicotine dependence: Secondary | ICD-10-CM | POA: Diagnosis not present

## 2017-08-21 DIAGNOSIS — G2581 Restless legs syndrome: Secondary | ICD-10-CM | POA: Diagnosis not present

## 2017-08-21 DIAGNOSIS — Z7951 Long term (current) use of inhaled steroids: Secondary | ICD-10-CM | POA: Diagnosis not present

## 2017-08-21 DIAGNOSIS — Z7902 Long term (current) use of antithrombotics/antiplatelets: Secondary | ICD-10-CM | POA: Diagnosis not present

## 2017-08-21 DIAGNOSIS — E119 Type 2 diabetes mellitus without complications: Secondary | ICD-10-CM | POA: Diagnosis not present

## 2017-08-21 DIAGNOSIS — M810 Age-related osteoporosis without current pathological fracture: Secondary | ICD-10-CM | POA: Diagnosis not present

## 2017-08-26 DIAGNOSIS — Z9181 History of falling: Secondary | ICD-10-CM | POA: Diagnosis not present

## 2017-08-26 DIAGNOSIS — M810 Age-related osteoporosis without current pathological fracture: Secondary | ICD-10-CM | POA: Diagnosis not present

## 2017-08-26 DIAGNOSIS — Z7951 Long term (current) use of inhaled steroids: Secondary | ICD-10-CM | POA: Diagnosis not present

## 2017-08-26 DIAGNOSIS — J441 Chronic obstructive pulmonary disease with (acute) exacerbation: Secondary | ICD-10-CM | POA: Diagnosis not present

## 2017-08-26 DIAGNOSIS — I10 Essential (primary) hypertension: Secondary | ICD-10-CM | POA: Diagnosis not present

## 2017-08-26 DIAGNOSIS — I69354 Hemiplegia and hemiparesis following cerebral infarction affecting left non-dominant side: Secondary | ICD-10-CM | POA: Diagnosis not present

## 2017-08-26 DIAGNOSIS — G2581 Restless legs syndrome: Secondary | ICD-10-CM | POA: Diagnosis not present

## 2017-08-26 DIAGNOSIS — Z7902 Long term (current) use of antithrombotics/antiplatelets: Secondary | ICD-10-CM | POA: Diagnosis not present

## 2017-08-26 DIAGNOSIS — Z87891 Personal history of nicotine dependence: Secondary | ICD-10-CM | POA: Diagnosis not present

## 2017-08-26 DIAGNOSIS — E119 Type 2 diabetes mellitus without complications: Secondary | ICD-10-CM | POA: Diagnosis not present

## 2017-09-03 DIAGNOSIS — Z6822 Body mass index (BMI) 22.0-22.9, adult: Secondary | ICD-10-CM | POA: Diagnosis not present

## 2017-09-03 DIAGNOSIS — G4700 Insomnia, unspecified: Secondary | ICD-10-CM | POA: Diagnosis not present

## 2017-09-14 DIAGNOSIS — Z6823 Body mass index (BMI) 23.0-23.9, adult: Secondary | ICD-10-CM | POA: Diagnosis not present

## 2017-09-14 DIAGNOSIS — G4700 Insomnia, unspecified: Secondary | ICD-10-CM | POA: Diagnosis not present

## 2017-10-14 DIAGNOSIS — Z6823 Body mass index (BMI) 23.0-23.9, adult: Secondary | ICD-10-CM | POA: Diagnosis not present

## 2017-10-14 DIAGNOSIS — G4700 Insomnia, unspecified: Secondary | ICD-10-CM | POA: Diagnosis not present

## 2017-11-02 DIAGNOSIS — Z79899 Other long term (current) drug therapy: Secondary | ICD-10-CM | POA: Diagnosis not present

## 2017-11-02 DIAGNOSIS — G250 Essential tremor: Secondary | ICD-10-CM | POA: Diagnosis not present

## 2017-11-02 DIAGNOSIS — Z881 Allergy status to other antibiotic agents status: Secondary | ICD-10-CM | POA: Diagnosis not present

## 2017-11-02 DIAGNOSIS — Z9689 Presence of other specified functional implants: Secondary | ICD-10-CM | POA: Diagnosis not present

## 2017-11-02 DIAGNOSIS — Z8673 Personal history of transient ischemic attack (TIA), and cerebral infarction without residual deficits: Secondary | ICD-10-CM | POA: Diagnosis not present

## 2017-11-02 DIAGNOSIS — Z888 Allergy status to other drugs, medicaments and biological substances status: Secondary | ICD-10-CM | POA: Diagnosis not present

## 2017-11-02 DIAGNOSIS — R2681 Unsteadiness on feet: Secondary | ICD-10-CM | POA: Diagnosis not present

## 2017-11-02 DIAGNOSIS — Z880 Allergy status to penicillin: Secondary | ICD-10-CM | POA: Diagnosis not present

## 2017-11-02 DIAGNOSIS — R251 Tremor, unspecified: Secondary | ICD-10-CM | POA: Diagnosis not present

## 2017-11-02 DIAGNOSIS — Z882 Allergy status to sulfonamides status: Secondary | ICD-10-CM | POA: Diagnosis not present

## 2017-11-02 DIAGNOSIS — Z885 Allergy status to narcotic agent status: Secondary | ICD-10-CM | POA: Diagnosis not present

## 2017-11-02 DIAGNOSIS — Z886 Allergy status to analgesic agent status: Secondary | ICD-10-CM | POA: Diagnosis not present

## 2018-01-12 DIAGNOSIS — E119 Type 2 diabetes mellitus without complications: Secondary | ICD-10-CM | POA: Diagnosis not present

## 2018-01-12 DIAGNOSIS — Z1389 Encounter for screening for other disorder: Secondary | ICD-10-CM | POA: Diagnosis not present

## 2018-01-12 DIAGNOSIS — Z23 Encounter for immunization: Secondary | ICD-10-CM | POA: Diagnosis not present

## 2018-01-12 DIAGNOSIS — Z6824 Body mass index (BMI) 24.0-24.9, adult: Secondary | ICD-10-CM | POA: Diagnosis not present

## 2018-01-12 DIAGNOSIS — I10 Essential (primary) hypertension: Secondary | ICD-10-CM | POA: Diagnosis not present

## 2018-01-12 DIAGNOSIS — G252 Other specified forms of tremor: Secondary | ICD-10-CM | POA: Diagnosis not present

## 2018-01-19 ENCOUNTER — Other Ambulatory Visit: Payer: Self-pay | Admitting: Orthopedic Surgery

## 2018-01-19 DIAGNOSIS — M79642 Pain in left hand: Secondary | ICD-10-CM | POA: Diagnosis not present

## 2018-01-19 DIAGNOSIS — M1812 Unilateral primary osteoarthritis of first carpometacarpal joint, left hand: Secondary | ICD-10-CM | POA: Diagnosis not present

## 2018-03-01 DIAGNOSIS — R29898 Other symptoms and signs involving the musculoskeletal system: Secondary | ICD-10-CM | POA: Diagnosis not present

## 2018-03-01 NOTE — Progress Notes (Signed)
Pt's past medical history and chart reviewed with Dr Glade Stanford Fitzgerald. Surgery ok to be done at [facility] pending clearance from pt's Neurologist.    Spoke with Bjorn Loserhonda at Dr Merrilee SeashoreKuzma's office on Friday 02/26/18 and requested the above clearance and Plavix instructions.

## 2018-03-11 ENCOUNTER — Ambulatory Visit (HOSPITAL_BASED_OUTPATIENT_CLINIC_OR_DEPARTMENT_OTHER): Admission: RE | Admit: 2018-03-11 | Payer: Medicare Other | Source: Home / Self Care | Admitting: Orthopedic Surgery

## 2018-03-11 ENCOUNTER — Encounter (HOSPITAL_BASED_OUTPATIENT_CLINIC_OR_DEPARTMENT_OTHER): Admission: RE | Payer: Self-pay | Source: Home / Self Care

## 2018-03-11 SURGERY — FINGER ARTHROSCOPY WITH CARPOMETACARPEL (CMC) ARTHROPLASTY
Anesthesia: Choice | Laterality: Left

## 2018-04-19 DIAGNOSIS — G250 Essential tremor: Secondary | ICD-10-CM | POA: Diagnosis not present

## 2018-04-19 DIAGNOSIS — R29898 Other symptoms and signs involving the musculoskeletal system: Secondary | ICD-10-CM | POA: Diagnosis not present

## 2018-04-19 DIAGNOSIS — I69354 Hemiplegia and hemiparesis following cerebral infarction affecting left non-dominant side: Secondary | ICD-10-CM | POA: Diagnosis not present

## 2018-04-19 DIAGNOSIS — I10 Essential (primary) hypertension: Secondary | ICD-10-CM | POA: Diagnosis not present

## 2018-04-19 DIAGNOSIS — G4701 Insomnia due to medical condition: Secondary | ICD-10-CM | POA: Diagnosis not present

## 2018-05-06 DIAGNOSIS — I10 Essential (primary) hypertension: Secondary | ICD-10-CM | POA: Diagnosis not present

## 2018-05-06 DIAGNOSIS — Z9689 Presence of other specified functional implants: Secondary | ICD-10-CM | POA: Diagnosis not present

## 2018-05-06 DIAGNOSIS — Z79899 Other long term (current) drug therapy: Secondary | ICD-10-CM | POA: Diagnosis not present

## 2018-05-06 DIAGNOSIS — Z4542 Encounter for adjustment and management of neuropacemaker (brain) (peripheral nerve) (spinal cord): Secondary | ICD-10-CM | POA: Diagnosis not present

## 2018-05-06 DIAGNOSIS — G250 Essential tremor: Secondary | ICD-10-CM | POA: Diagnosis not present

## 2018-05-13 DIAGNOSIS — I69954 Hemiplegia and hemiparesis following unspecified cerebrovascular disease affecting left non-dominant side: Secondary | ICD-10-CM | POA: Diagnosis not present

## 2018-05-13 DIAGNOSIS — M545 Low back pain: Secondary | ICD-10-CM | POA: Diagnosis not present

## 2018-05-13 DIAGNOSIS — Z7902 Long term (current) use of antithrombotics/antiplatelets: Secondary | ICD-10-CM | POA: Diagnosis not present

## 2018-05-13 DIAGNOSIS — Z91040 Latex allergy status: Secondary | ICD-10-CM | POA: Diagnosis not present

## 2018-05-13 DIAGNOSIS — M5441 Lumbago with sciatica, right side: Secondary | ICD-10-CM | POA: Diagnosis not present

## 2018-05-13 DIAGNOSIS — E785 Hyperlipidemia, unspecified: Secondary | ICD-10-CM | POA: Diagnosis not present

## 2018-05-13 DIAGNOSIS — K219 Gastro-esophageal reflux disease without esophagitis: Secondary | ICD-10-CM | POA: Diagnosis not present

## 2018-05-13 DIAGNOSIS — S01512A Laceration without foreign body of oral cavity, initial encounter: Secondary | ICD-10-CM | POA: Diagnosis not present

## 2018-05-13 DIAGNOSIS — Z888 Allergy status to other drugs, medicaments and biological substances status: Secondary | ICD-10-CM | POA: Diagnosis not present

## 2018-05-13 DIAGNOSIS — X58XXXA Exposure to other specified factors, initial encounter: Secondary | ICD-10-CM | POA: Diagnosis not present

## 2018-05-13 DIAGNOSIS — Z880 Allergy status to penicillin: Secondary | ICD-10-CM | POA: Diagnosis not present

## 2018-05-13 DIAGNOSIS — E119 Type 2 diabetes mellitus without complications: Secondary | ICD-10-CM | POA: Diagnosis not present

## 2018-05-13 DIAGNOSIS — I10 Essential (primary) hypertension: Secondary | ICD-10-CM | POA: Diagnosis not present

## 2018-05-13 DIAGNOSIS — Z87891 Personal history of nicotine dependence: Secondary | ICD-10-CM | POA: Diagnosis not present

## 2018-05-17 DIAGNOSIS — Z6824 Body mass index (BMI) 24.0-24.9, adult: Secondary | ICD-10-CM | POA: Diagnosis not present

## 2018-05-17 DIAGNOSIS — S01512A Laceration without foreign body of oral cavity, initial encounter: Secondary | ICD-10-CM | POA: Diagnosis not present

## 2018-05-17 DIAGNOSIS — M5441 Lumbago with sciatica, right side: Secondary | ICD-10-CM | POA: Diagnosis not present

## 2018-05-18 ENCOUNTER — Other Ambulatory Visit: Payer: Self-pay

## 2018-05-18 NOTE — Patient Outreach (Signed)
[facility]) Care Management  05/18/2018  Beth Young 01/22/1946 [id number]   Referral Date: 05/17/2018 Referral Source: Um referral Referral Reason: Dental/denture resources, food resources, medication reconciliation   Outreach Attempt: spoke with patient.  She is able to verify HIPAA.  Discussed reason for referral. Patient states that she got dentures about 20 years ago but cannot wear them as she has split gums.  Patient wondering about any denture resources.  Patient only gets $16 in food stamps and only has about $50 left out if her check once she pays her bills.  She is in need of food resources.    Discussed [facility] services and support.  Patient agreeable to social work for any dental/denture and food resources.    Patient reports that she gets her medication in pill packs from [facility] and takes as prescribed.  She denies any problems with medications.     Plan: RN CM will refer to social work for resources.     Bary Leriche, RN, MSN [facility] Care Management Care Management Coordinator Direct Line [phone] Toll Free: [phone]  Fax: [phone]

## 2018-06-03 ENCOUNTER — Other Ambulatory Visit: Payer: Self-pay

## 2018-06-03 NOTE — Patient Outreach (Signed)
[facility]) Care Management  06/03/2018  Beth Young 1945/07/05 [id number]   Successful outreach to patient regarding social work referral for dental and food resources.  Patient reports having a split gum and says that she is unable to wear her dentures at this time.  She contacted a dental provider and was told that there would be a $45 fee up front in addition to the cost of services provided.  Per patient, a payment plan is an option but she reported that she cannot afford "another payment" at this time.  She does not have dental coverage through he insurance provider.  BSW informed her that, unfortunately, there are limited options for dental resources.  BSW did provide her with contact information for [facility]. BSW talked with patient about whether or not she has ever applied for Medicaid.  BSW informed her that many more services/supplies are covered under Medicaid that are not covered under Medicare.  Patient is $10 over the income limit for Medicaid but did say that she would like to apply.  BSW mailed Medicaid application. Patient receives $16/month of food stamps and reports still having difficulty with purchase of food.  She is currently living with her sister due to having back issues and reports that they have sufficient food at this time.  BSW offered to send her food resources for [facility]. BSW will follow up within the next two weeks to ensure receipt of resources.   Malachy Chamber, BSW Social Worker [phone]

## 2018-06-04 DIAGNOSIS — R29898 Other symptoms and signs involving the musculoskeletal system: Secondary | ICD-10-CM | POA: Diagnosis not present

## 2018-06-04 DIAGNOSIS — G250 Essential tremor: Secondary | ICD-10-CM | POA: Diagnosis not present

## 2018-06-04 DIAGNOSIS — I69354 Hemiplegia and hemiparesis following cerebral infarction affecting left non-dominant side: Secondary | ICD-10-CM | POA: Diagnosis not present

## 2018-06-04 DIAGNOSIS — I10 Essential (primary) hypertension: Secondary | ICD-10-CM | POA: Diagnosis not present

## 2018-06-04 DIAGNOSIS — M5441 Lumbago with sciatica, right side: Secondary | ICD-10-CM | POA: Diagnosis not present

## 2018-06-08 DIAGNOSIS — Z9181 History of falling: Secondary | ICD-10-CM | POA: Diagnosis not present

## 2018-06-08 DIAGNOSIS — Z6824 Body mass index (BMI) 24.0-24.9, adult: Secondary | ICD-10-CM | POA: Diagnosis not present

## 2018-06-08 DIAGNOSIS — Z0001 Encounter for general adult medical examination with abnormal findings: Secondary | ICD-10-CM | POA: Diagnosis not present

## 2018-06-17 ENCOUNTER — Ambulatory Visit: Payer: Self-pay

## 2018-06-17 ENCOUNTER — Other Ambulatory Visit: Payer: Self-pay

## 2018-06-17 NOTE — Patient Outreach (Addendum)
[facility]) Care Management  06/17/2018  Beth Young May 31, 1945 [id number]   Unsuccessful outreach to patient to ensure receipt of resources mailed on 06/03/18.  Unable to leave voicemail message.  BSW will attempt to reach again within four business days.  Malachy Chamber, BSW Social Worker [phone]

## 2018-06-21 ENCOUNTER — Other Ambulatory Visit: Payer: Self-pay

## 2018-06-21 NOTE — Patient Outreach (Signed)
[facility]) Care Management  06/21/2018  Beth Young 18-Jul-1945 [id number]   Successful outreach to patient to ensure receipt of resources mailed on 06/03/18.  Patient said that she has not received resources and requested that they be mailed to her sisters address as she is currently staying there.  BSW mailed the following:  Medicaid application, [facility] [employer], [employer], [employer]. Will follow up with patient again within the next two weeks to ensure receipt.   Malachy Chamber, BSW Social Worker [phone]

## 2018-06-25 ENCOUNTER — Other Ambulatory Visit: Payer: Self-pay

## 2018-06-25 NOTE — Patient Outreach (Signed)
[facility]) Care Management  06/25/2018  Beth Young 1945-05-15 [id number]   Incoming call from patient's sister confirming receipt of Medicaid application.  BSW provided mailing address for [facility] of Social Services.  Patient's sister has a copy machine and is able to assist with documentation to be submitted with application.  BSW is closing case but encouraged patient to call if additional needs arise.    Malachy Chamber, BSW Social Worker [phone]

## 2018-07-05 ENCOUNTER — Ambulatory Visit: Payer: Medicare Other

## 2018-07-12 DIAGNOSIS — K219 Gastro-esophageal reflux disease without esophagitis: Secondary | ICD-10-CM | POA: Diagnosis not present

## 2018-07-12 DIAGNOSIS — E782 Mixed hyperlipidemia: Secondary | ICD-10-CM | POA: Diagnosis not present

## 2018-07-12 DIAGNOSIS — Z7689 Persons encountering health services in other specified circumstances: Secondary | ICD-10-CM | POA: Diagnosis not present

## 2018-07-12 DIAGNOSIS — G20 Parkinson's disease: Secondary | ICD-10-CM | POA: Diagnosis not present

## 2018-07-12 DIAGNOSIS — E119 Type 2 diabetes mellitus without complications: Secondary | ICD-10-CM | POA: Diagnosis not present

## 2018-07-12 DIAGNOSIS — D509 Iron deficiency anemia, unspecified: Secondary | ICD-10-CM | POA: Diagnosis not present

## 2018-07-12 DIAGNOSIS — I10 Essential (primary) hypertension: Secondary | ICD-10-CM | POA: Diagnosis not present

## 2018-07-14 DIAGNOSIS — E119 Type 2 diabetes mellitus without complications: Secondary | ICD-10-CM | POA: Diagnosis not present

## 2018-07-14 DIAGNOSIS — R29898 Other symptoms and signs involving the musculoskeletal system: Secondary | ICD-10-CM | POA: Diagnosis not present

## 2018-07-14 DIAGNOSIS — G250 Essential tremor: Secondary | ICD-10-CM | POA: Diagnosis not present

## 2018-07-14 DIAGNOSIS — I10 Essential (primary) hypertension: Secondary | ICD-10-CM | POA: Diagnosis not present

## 2018-07-14 DIAGNOSIS — I69354 Hemiplegia and hemiparesis following cerebral infarction affecting left non-dominant side: Secondary | ICD-10-CM | POA: Diagnosis not present

## 2018-08-07 DIAGNOSIS — I10 Essential (primary) hypertension: Secondary | ICD-10-CM | POA: Diagnosis not present

## 2018-08-07 DIAGNOSIS — E785 Hyperlipidemia, unspecified: Secondary | ICD-10-CM | POA: Diagnosis not present

## 2018-09-07 DIAGNOSIS — E119 Type 2 diabetes mellitus without complications: Secondary | ICD-10-CM | POA: Diagnosis not present

## 2018-09-07 DIAGNOSIS — I10 Essential (primary) hypertension: Secondary | ICD-10-CM | POA: Diagnosis not present

## 2018-09-07 DIAGNOSIS — E7800 Pure hypercholesterolemia, unspecified: Secondary | ICD-10-CM | POA: Diagnosis not present

## 2018-09-28 DIAGNOSIS — M5441 Lumbago with sciatica, right side: Secondary | ICD-10-CM | POA: Diagnosis not present

## 2018-09-28 DIAGNOSIS — K529 Noninfective gastroenteritis and colitis, unspecified: Secondary | ICD-10-CM | POA: Diagnosis not present

## 2018-09-28 DIAGNOSIS — Z9181 History of falling: Secondary | ICD-10-CM | POA: Diagnosis not present

## 2018-09-28 DIAGNOSIS — Z6824 Body mass index (BMI) 24.0-24.9, adult: Secondary | ICD-10-CM | POA: Diagnosis not present

## 2018-10-05 ENCOUNTER — Encounter: Payer: Self-pay | Admitting: Internal Medicine

## 2018-10-15 DIAGNOSIS — D649 Anemia, unspecified: Secondary | ICD-10-CM | POA: Diagnosis not present

## 2018-10-20 ENCOUNTER — Ambulatory Visit (INDEPENDENT_AMBULATORY_CARE_PROVIDER_SITE_OTHER): Payer: Medicare Other | Admitting: Orthopaedic Surgery

## 2018-10-20 ENCOUNTER — Encounter: Payer: Self-pay | Admitting: Orthopaedic Surgery

## 2018-10-20 ENCOUNTER — Other Ambulatory Visit: Payer: Self-pay

## 2018-10-20 DIAGNOSIS — M5441 Lumbago with sciatica, right side: Secondary | ICD-10-CM

## 2018-10-20 DIAGNOSIS — M545 Low back pain, unspecified: Secondary | ICD-10-CM | POA: Insufficient documentation

## 2018-10-20 DIAGNOSIS — M5136 Other intervertebral disc degeneration, lumbar region: Secondary | ICD-10-CM | POA: Diagnosis not present

## 2018-10-20 DIAGNOSIS — G8929 Other chronic pain: Secondary | ICD-10-CM | POA: Diagnosis not present

## 2018-10-20 NOTE — Progress Notes (Signed)
Office Visit Note   Patient: Beth Young           Date of Birth: [birth year]           MRN: [id number] Visit Date: 10/20/2018              Requested by: Allwardt, WittmannAlyssa, PA [address] [place],  [place] [phone] PCP: Ila McgillAllwardt, Alyssa, PA   Assessment & Plan: Visit Diagnoses:  1. Other intervertebral disc degeneration, lumbar region   2. Chronic right-sided low back pain with right-sided sciatica     Plan: Chronic low back pain with right lower extremity radiculopathy.  Multiple medical comorbidities.  Will order CT scan of lumbar spine as patient has brainstem stimulator embedded into the right paralumbar region.  Follow-Up Instructions: Return After CT scan lumbar spine.   Orders:  Orders Placed This Encounter  Procedures  . CT LUMBAR SPINE WO CONTRAST   No orders of the defined types were placed in this encounter.     Procedures: No procedures performed   Clinical Data: No additional findings.   Subjective: Chief Complaint  Patient presents with  . Lower Back - Pain  Patient presents today for lower back pain. She fell in February of 2020 and landed on her back. She has been having pain since. She said that it will sometimes radiate down her right leg posteriorly. She takes Tramadol as needed. [facility] July and had x-rays taken.  Mrs. Beth Young is accompanied by her sister and here for evaluation of the chronic back pain.  She has a number of medical comorbidities including an hereditary essential tremor and ataxia.  She does have a brainstem stimulator.  She has had a number of falls over the years.  About 5 months ago when she fell she had initial onset of low back pain.  The pain originates in the lumbar spine and is referred into her right lower extremity.  She has tried tramadol which gives her "a little relief".  She has  had prior history of 4 strokes the last of which was approximately a year ago and does have some residual  weakness in her left side. She presently is on Plavix for her prior strokes.  She is taking tramadol as needed for pain.  She has allergies to hydrocodone and codeine. She is referred by the [facility].  Office notes accompanied her which we will scan to the chart and which I have reviewed. X-rays of her lumbar spine were obtained nearly a month ago through her primary care physician's office.  These were reviewed on the PACS system.  There are some degenerative disc changes at L5-S1 with mild decrease in the disc space height.  Facet joint sclerosis identified at L4-5 and L5-S1.  Might have some narrowing of the canal at L5-S1.  Diffuse calcification of the abdominal aorta but without obvious aneurysmal dilatation.  No listhesis.  No curvature of the spine.  Brainstem stimulator is implanted over the right ilium.  Limited views of the hips did not demonstrate any acute changes or arthritis  HPI  Review of Systems   Objective: Vital Signs: BP (!) 102/52   Pulse 84   Ht 5\' 2"  (1.575 m)   Wt 133 lb (60.3 kg)   BMI 24.33 kg/m   Physical Exam Constitutional:      Appearance: She is well-developed.  Eyes:     Pupils: Pupils are equal, round, and  reactive to light.  Pulmonary:     Effort: Pulmonary effort is normal.  Skin:    General: Skin is warm and dry.  Neurological:     Mental Status: She is alert and oriented to person, place, and time.  Psychiatric:        Behavior: Behavior normal.     Ortho Exam awake alert and oriented x3.  Comfortable sitting.  Straight leg raise was negative on the left except for some hamstring tightness.  There was some discomfort in her back on raising the right leg at about 90 degrees.  She does have some diffuse mild weakness of her left lower extremity related to her prior strokes.  Painless range of motion of both hips and knees.  No percussible tenderness of the sacrum.  Some mild percussible tenderness in the right paralumbar  region but not the flanks.  Specialty Comments:  No specialty comments available.  Imaging: No results found.   PMFS History: Patient Active Problem List   Diagnosis Date Noted  . Low back pain 10/20/2018  . Fecal incontinence 04/14/2017  . Diarrhea 12/11/2016  . Essential tremor 04/17/2014  . S/P deep brain stimulator placement 04/17/2014  . Restless leg 01/15/2012  . Diabetes mellitus, type 2 ([place]) 06/02/2011  . CAFL (chronic airflow limitation) ([place]) 10/02/2010  . Acid reflux 10/02/2010  . HLD (hyperlipidemia) 10/02/2010  . 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 Anxiety   . Arthritis   . Asthma   . Depression   . GERD (gastroesophageal reflux disease)   . History of stroke   . Hyperlipidemia   . Hypertension   . Insomnia   . Osteoporosis   . 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 Restless legs syndrome   . Type 2 diabetes mellitus ([facility])     Family History  Problem Relation Age of Onset  . Dementia Mother   . Heart attack Father   . Colon cancer Neg Hx   . Gastric cancer Neg Hx   . Esophageal cancer Neg Hx     Past Surgical History:  Procedure Laterality Date  . ABDOMINAL HYSTERECTOMY    . ANKLE SURGERY Right   . BIOPSY  05/04/2017   Procedure: BIOPSY;  Surgeon: Daneil Dolin, MD;  Location: [facility];  Service: Endoscopy;;  ascending, descending, sigmoid bx's  . BURR HOLE W/ STEREOTACTIC INSERTION OF DBS LEADS / INTRAOP MICROELECTRODE RECORDING  04/03/2012  . CATARACT EXTRACTION, BILATERAL    . COLONOSCOPY WITH PROPOFOL N/A 05/04/2017   Procedure: COLONOSCOPY WITH PROPOFOL;  Surgeon: Daneil Dolin, MD;  Location: [facility];  Service: Endoscopy;  Laterality: N/A;  1:30pm  . DEEP BRAIN STIMULATOR PLACEMENT     Medtronic ACTIVA PC  . 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  Marland Kitchen HAND SURGERY Bilateral    joint replacement, bilateral thumbs    Social History   Occupational History  . Occupation: retired    Comment: build Psychologist, counselling for phones  Tobacco Use  . Smoking status: Former Smoker    Packs/day: 0.50    Years: 5.00    Pack years: 2.50    Types: Cigarettes    Quit date: 03/10/2012    Years since quitting: 6.6  . Smokeless tobacco: Former Systems developer    Types: Snuff  . Tobacco comment: Snuff for about 10 years  Substance and Sexual Activity  .  Alcohol use: No    Alcohol/week: 0.0 standard drinks  . Drug use: No  . Sexual activity: Not Currently    Birth control/protection: Surgical

## 2018-11-08 ENCOUNTER — Other Ambulatory Visit: Payer: Self-pay

## 2018-11-08 ENCOUNTER — Ambulatory Visit (HOSPITAL_COMMUNITY)
Admission: RE | Admit: 2018-11-08 | Discharge: 2018-11-08 | Disposition: A | Discharge status: Home / Self Care | Payer: Medicare Other | Source: Ambulatory Visit | Attending: Orthopaedic Surgery | Admitting: Orthopaedic Surgery

## 2018-11-08 DIAGNOSIS — M48061 Spinal stenosis, lumbar region without neurogenic claudication: Secondary | ICD-10-CM | POA: Diagnosis not present

## 2018-11-08 DIAGNOSIS — M5136 Other intervertebral disc degeneration, lumbar region: Secondary | ICD-10-CM | POA: Insufficient documentation

## 2018-11-08 DIAGNOSIS — M5127 Other intervertebral disc displacement, lumbosacral region: Secondary | ICD-10-CM | POA: Diagnosis not present

## 2018-11-12 ENCOUNTER — Other Ambulatory Visit: Payer: Self-pay

## 2018-11-12 ENCOUNTER — Ambulatory Visit: Payer: Medicare Other | Admitting: Gastroenterology

## 2018-11-12 ENCOUNTER — Encounter: Payer: Self-pay | Admitting: Gastroenterology

## 2018-11-12 VITALS — BP 112/52 | HR 77 | Temp 95.9°F | Ht 61.0 in | Wt 133.4 lb

## 2018-11-12 DIAGNOSIS — R197 Diarrhea, unspecified: Secondary | ICD-10-CM | POA: Diagnosis not present

## 2018-11-12 DIAGNOSIS — D649 Anemia, unspecified: Secondary | ICD-10-CM | POA: Diagnosis not present

## 2018-11-12 MED ORDER — DICYCLOMINE HCL 10 MG PO CAPS: 10.0000 mg | ORAL_CAPSULE | Freq: Two times a day (BID) | ORAL | 1 refills | Status: DC | PRN

## 2018-11-12 NOTE — Patient Instructions (Signed)
1. Please go for labs as soon as you can. We will contact you with results as available. 2. Try bentyl up to twice daily for diarrhea. Take only when you have the symptoms.

## 2018-11-12 NOTE — Assessment & Plan Note (Signed)
Rule out iron deficiency

## 2018-11-12 NOTE — Progress Notes (Signed)
Primary Care Physician:  Allwardt, Arlyss RepressAlyssa, PA  Primary Gastroenterologist:  Roetta SessionsMichael Rourk, MD   Chief Complaint  Patient presents with  . Diarrhea    x1 year  . fecal incontinence    HPI:  Beth Young is a 73 y.o. female here for follow up of chronic diarrhea with fecal incontinence. Last seen in 04/2017. She had work up for diarrhea including colonoscopy with random colon biopsies which were negative.  Latter part of 2018, pancreatic elastase was greater than 500, stool osmolality/Na/K c/w secretory diarrhea.   She notes certain foods aggravate diarrhea; Seeds, peanuts, lettuce, tomatoes, orange juice, spicy food.  Sister with same thing but took a powder that helped her, ?Latviaquestran. They both still have their gallbladders. Sometimes she has no warnings. Will have fecal incontinence while asleep or in the bathtub. May have 4-5 episodes per day. Some days first stool may be solid. No melena, brbpr. No recent medication changes except started iron for mild anemia. Stools dark. No abd pain. No weight loss. No vomiting. Takes topamax prn only. No heartburn, vomiting. Appetite is good. Has had increased back pain since 04/2018. Recent MRI. Sometimes has pain/tingling in the back of her right thigh and into both feet. Finds it hard to stand to cook or do the dishes.   Son has a lot of diarrhea issues. She thinks he has celiac disease. States he also doesn't tolerate eggs, milk.  Recent H/H slightly low. See labs below.      Current Outpatient Medications  Medication Sig Dispense Refill  . albuterol (PROVENTIL HFA;VENTOLIN HFA) 108 (90 Base) MCG/ACT inhaler Inhale 1 puff into the lungs every 6 (six) hours as needed for wheezing or shortness of breath.    Marland Kitchen. atorvastatin (LIPITOR) 80 MG tablet Take 80 mg by mouth daily.     . carbidopa-levodopa (SINEMET IR) 25-100 MG tablet Take 1 tablet by mouth 3 (three) times daily.    . clopidogrel (PLAVIX) 75 MG tablet Take 75 mg by mouth daily.     Marland Kitchen.  doxepin (SINEQUAN) 25 MG capsule Take 25 mg by mouth at bedtime.     Marland Kitchen. escitalopram (LEXAPRO) 20 MG tablet TAKE 1 TABLET BY MOUTH DAILY FOR DEPRESSION  2  . ferrous sulfate 325 (65 FE) MG tablet Take 325 mg by mouth daily.    . fluticasone (FLONASE) 50 MCG/ACT nasal spray Place 2 sprays into both nostrils daily as needed for allergies or rhinitis.    Marland Kitchen. lisinopril (ZESTRIL) 20 MG tablet Take 20 mg by mouth daily.     Marland Kitchen. LORazepam (ATIVAN) 1 MG tablet Take 0.5-1 mg by mouth every 6 (six) hours as needed (severe tremors).    . montelukast (SINGULAIR) 10 MG tablet Take 10 mg by mouth daily.     Marland Kitchen. omeprazole (PRILOSEC) 20 MG capsule Take 20 mg by mouth daily. TAKE 1 CAPSULE (20 MG TOTAL) BY MOUTH DAILY.    Marland Kitchen. propranolol (INDERAL) 40 MG tablet Take 40 mg by mouth daily as needed (for severe tremors from migraines). Takes infrequently    . propranolol ER (INDERAL LA) 80 MG 24 hr capsule Take 80 mg by mouth daily. For hypertension    . raloxifene (EVISTA) 60 MG tablet Take 60 mg by mouth daily.    Marland Kitchen. tiotropium (SPIRIVA HANDIHALER) 18 MCG inhalation capsule Place into inhaler and inhale.    . topiramate (TOPAMAX) 50 MG tablet Take 50 mg by mouth at bedtime.     . traMADol (ULTRAM) 50 MG tablet  Take 50 mg by mouth every 12 (twelve) hours as needed (pain).     No current facility-administered medications for this visit.     Allergies as of 11/12/2018 - Review Complete 11/12/2018  Allergen Reaction Noted  . Bupropion Other (See Comments) 04/17/2014  . Codeine Other (See Comments) 04/17/2014  . Tape Other (See Comments) 04/17/2014  . Trichlormethiazide Other (See Comments) 04/17/2014  . Penicillins Rash 04/17/2014  . Silver Rash 04/17/2014  . Sulfa antibiotics Itching 04/17/2014    ROS:  General: Negative for anorexia, weight loss, fever, chills, fatigue, weakness. ENT: Negative for hoarseness, difficulty swallowing , nasal congestion. CV: Negative for chest pain, angina, palpitations, dyspnea on  exertion, peripheral edema.  Respiratory: Negative for dyspnea at rest, dyspnea on exertion, cough, sputum, wheezing.  GI: See history of present illness. GU:  Negative for dysuria, hematuria, urinary incontinence, urinary frequency, nocturnal urination.  Endo: Negative for unusual weight change.      Physical Examination:  BP (!) 112/52   Pulse 77   Temp (!) 95.9 F (35.5 C) (Temporal)   Ht 5\' 1"  (1.549 m)   Wt 133 lb 6.4 oz (60.5 kg)   BMI 25.21 kg/m      General: Well-nourished, well-developed in no acute distress.  Eyes: No icterus. Mouth: Oropharyngeal mucosa moist and pink , no lesions erythema or exudate. Lungs: Clear to auscultation bilaterally.  Heart: Regular rate and rhythm, no murmurs rubs or gallops.  Abdomen: Bowel sounds are normal, nontender, nondistended, no hepatosplenomegaly or masses, no abdominal bruits or hernia , no rebound or guarding.  Brain stim battery pack in rlq Extremities: No lower extremity edema.  Neuro: Alert and oriented x 4   Skin: Warm and dry, no jaundice.   Psych: Alert and cooperative, normal mood and affect.   Labs 09/2018:  WBC 9600, H/H 10.6/34.4, platelet 334,000, BUN 10, Cre 1.29, Tbili0.3, AP 63, AST 19, ALT 9, alb 3.5  Imaging Studies: Ct Lumbar Spine Wo Contrast  Result Date: 11/08/2018 CLINICAL DATA:  Low back pain radiating into the right leg since a fall in February, 2020. Initial encounter. EXAM: CT LUMBAR SPINE WITHOUT CONTRAST TECHNIQUE: Multidetector CT imaging of the lumbar spine was performed without intravenous contrast administration. Multiplanar CT image reconstructions were also generated. COMPARISON:  Plain films lumbar spine from [place] 09/28/2018. FINDINGS: Segmentation: Standard. Alignment: Normal. Vertebrae: No acute fracture or focal pathologic process. Paraspinal and other soft tissues: Atherosclerotic vascular disease noted. Disc levels: T12-L1: Negative. L1-2: Negative. L2-3: Mild facet  degenerative change and ligamentum flavum thickening. No disc bulge or protrusion. No stenosis. L3-4: Negative. L4-5: Mild-to-moderate facet arthropathy is worse on the left. There is a shallow disc bulge and ligamentum flavum thickening. Moderate central canal stenosis is seen. The foramina are open. L5-S1: Mild-to-moderate facet degenerative change and a shallow broad-based central protrusion without stenosis. IMPRESSION: Negative for fracture.  No acute finding. Spondylosis most notable at L4-5 where a shallow disc bulge and ligamentum flavum thickening cause moderate central canal narrowing. Electronically Signed   By: Inge Rise M.D.   On: 11/08/2018 10:32

## 2018-11-12 NOTE — Assessment & Plan Note (Signed)
Pleasant 73 y/o female with history of chronic diarrhea associated with fecal incontinence. Colonoscopy last year unremarkable without evidence of microscopic colitis. Stools studies most c/w secretory diarrhea. Has failed imodium. She has a sister with similar symptoms. Her son may have celiac disease. Plan for celiac serologies. Trial of bentyl 10mg  BID prn diarrhea. Further recommendations to follow. Discussed that we may try questran in the future if no response to bentyl. Could also consider empiric trial of pancreatic enzymes (even with normal pancreatic elastase).

## 2018-11-16 DIAGNOSIS — D649 Anemia, unspecified: Secondary | ICD-10-CM | POA: Diagnosis not present

## 2018-11-16 DIAGNOSIS — R197 Diarrhea, unspecified: Secondary | ICD-10-CM | POA: Diagnosis not present

## 2018-11-17 LAB — CBC WITH DIFFERENTIAL/PLATELET
Absolute Monocytes: 353 cells/uL (ref 200–950)
Basophils Absolute: 60 cells/uL (ref 0–200)
Basophils Relative: 0.8 %
Eosinophils Absolute: 203 cells/uL (ref 15–500)
Eosinophils Relative: 2.7 %
HCT: 33.2 % — ABNORMAL LOW (ref 35.0–45.0)
Hemoglobin: 10.1 g/dL — ABNORMAL LOW (ref 11.7–15.5)
Lymphs Abs: 1710 cells/uL (ref 850–3900)
MCH: 24.9 pg — ABNORMAL LOW (ref 27.0–33.0)
MCHC: 30.4 g/dL — ABNORMAL LOW (ref 32.0–36.0)
MCV: 82 fL (ref 80.0–100.0)
MPV: 11.3 fL (ref 7.5–12.5)
Monocytes Relative: 4.7 %
Neutro Abs: 5175 cells/uL (ref 1500–7800)
Neutrophils Relative %: 69 %
Platelets: 248 10*3/uL (ref 140–400)
RBC: 4.05 10*6/uL (ref 3.80–5.10)
RDW: 14.2 % (ref 11.0–15.0)
Total Lymphocyte: 22.8 %
WBC: 7.5 10*3/uL (ref 3.8–10.8)

## 2018-11-17 LAB — IGA: Immunoglobulin A: 166 mg/dL (ref 70–320)

## 2018-11-17 LAB — TISSUE TRANSGLUTAMINASE, IGA: (tTG) Ab, IgA: 1 U/mL

## 2018-11-17 LAB — IRON,TIBC AND FERRITIN PANEL
%SAT: 27 % (calc) (ref 16–45)
Ferritin: 103 ng/mL (ref 16–288)
Iron: 70 ug/dL (ref 45–160)
TIBC: 260 mcg/dL (calc) (ref 250–450)

## 2018-11-23 ENCOUNTER — Other Ambulatory Visit: Payer: Self-pay

## 2018-11-23 DIAGNOSIS — D649 Anemia, unspecified: Secondary | ICD-10-CM

## 2018-12-13 ENCOUNTER — Encounter: Payer: Self-pay | Admitting: *Deleted

## 2018-12-13 ENCOUNTER — Other Ambulatory Visit: Payer: Self-pay | Admitting: *Deleted

## 2018-12-13 DIAGNOSIS — D649 Anemia, unspecified: Secondary | ICD-10-CM

## 2018-12-15 ENCOUNTER — Encounter: Payer: Self-pay | Admitting: Orthopaedic Surgery

## 2018-12-15 ENCOUNTER — Ambulatory Visit (INDEPENDENT_AMBULATORY_CARE_PROVIDER_SITE_OTHER): Payer: Medicare Other | Admitting: Orthopaedic Surgery

## 2018-12-15 VITALS — BP 114/63 | HR 74 | Ht 61.0 in | Wt 133.0 lb

## 2018-12-15 DIAGNOSIS — G8929 Other chronic pain: Secondary | ICD-10-CM

## 2018-12-15 DIAGNOSIS — M5441 Lumbago with sciatica, right side: Secondary | ICD-10-CM | POA: Diagnosis not present

## 2018-12-15 NOTE — Addendum Note (Signed)
Addended by: Lendon Collar on: 12/15/2018 10:56 AM   Modules accepted: Orders

## 2018-12-15 NOTE — Progress Notes (Signed)
Office Visit Note   Patient: Beth Young           Date of Birth: 12-19-1945           MRN: [id number] Visit Date: 12/15/2018              Requested by: Allwardt, Eads, PA [place],  [place] [id number] PCP: Theresa Duty, PA   Assessment & Plan: Visit Diagnoses:  1. Chronic right-sided low back pain with right-sided sciatica     Plan: Mrs. Patman had a CT scan of her lumbar spine in lieu of an MRI scan as she has a brain stimulator.  Study was performed about 5 weeks ago.  She is here for the results.  Her symptoms have not changed.  She is having considerable pain when she stands or walks any distance.  She has been experiencing claudication symptoms.  Her history is somewhat complicated that she has had a series of 4 strokes and does have some lower extremity discomfort.  She has been taking therapy for strengthening exercises.  The CT scan demonstrates spondylosis most notably at L4-5 where there is a shallow disc bulge and ligamentum flavum thickening causing moderate central canal narrowing.  I suspect that is the cause of her pain.  I discussed in detail with her over 30 minutes 50% of the time in counseling the different options.  Because of  all of her comorbidities I am not sure she is a good surgical candidate.  I think an epidural steroid injection would be a good next step.  She does have tramadol for pain  Follow-Up Instructions: Return in about 1 month (around 01/15/2019).   Orders:  No orders of the defined types were placed in this encounter.  No orders of the defined types were placed in this encounter.     Procedures: No procedures performed   Clinical Data: No additional findings.   Subjective: Chief Complaint  Patient presents with  . Lower Back - Follow-up    CT results  Patient presents today for follow up on her lower back. She had a CT scan of her L-Spine on 11/08/2018 and is here today for results. She is unable to stand for any length of  time. She is unable to stand long enough to make a sandwich.   HPI  Review of Systems   Objective: Vital Signs: BP 114/63   Pulse 74   Ht 5\' 1"  (1.549 m)   Wt 133 lb (60.3 kg)   BMI 25.13 kg/m   Physical Exam Constitutional:      Appearance: She is well-developed.  Eyes:     Pupils: Pupils are equal, round, and reactive to light.  Pulmonary:     Effort: Pulmonary effort is normal.  Skin:    General: Skin is warm and dry.  Neurological:     Mental Status: She is alert and oriented to person, place, and time.  Psychiatric:        Behavior: Behavior normal.     Ortho Exam hamstrings are tight little spasticity to both of the lower extremities presuming the base of her stroke.  Seems to have good dorsiflexion and plantarflexion of both of her feet.  No percussible tenderness of the lumbar spine  Specialty Comments:  No specialty comments available.  Imaging: No results found.   PMFS History: Patient Active Problem List   Diagnosis Date Noted  . Anemia 11/12/2018  . Low back pain 10/20/2018  . Fecal  incontinence 04/14/2017  . Diarrhea 12/11/2016  . Essential tremor 04/17/2014  . S/P deep brain stimulator placement 04/17/2014  . Restless leg 01/15/2012  . Diabetes mellitus, type 2 (HCC) 06/02/2011  . CAFL (chronic airflow limitation) (HCC) 10/02/2010  . Acid reflux 10/02/2010  . HLD (hyperlipidemia) 10/02/2010  . 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 Anxiety   . Arthritis   . Asthma   . Depression   . Essential tremor    with implantable brain stimulator device  . GERD (gastroesophageal reflux disease)   . History of stroke   . Hyperlipidemia   . Hypertension   . Insomnia   . Osteoporosis   . 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 Restless legs syndrome   . Type 2 diabetes mellitus (HCC)     Family History  Problem Relation Age of Onset  . Dementia Mother   . Heart  attack Father   . Breast cancer Sister   . Other Son        ???celiac  . Colon cancer Neg Hx   . Gastric cancer Neg Hx   . Esophageal cancer Neg Hx     Past Surgical History:  Procedure Laterality Date  . ABDOMINAL HYSTERECTOMY    . ANKLE SURGERY Right   . BIOPSY  05/04/2017   Procedure: BIOPSY;  Surgeon: Corbin Ade, MD;  Location: [facility];  Service: Endoscopy;;  ascending, descending, sigmoid bx's  . BURR HOLE W/ STEREOTACTIC INSERTION OF DBS LEADS / INTRAOP MICROELECTRODE RECORDING  04/03/2012  . CATARACT EXTRACTION, BILATERAL    . COLONOSCOPY WITH PROPOFOL N/A 05/04/2017   Dr. Jena Gauss: diverticulosis, random colon biopsies were negative.   Marland Kitchen DEEP BRAIN STIMULATOR PLACEMENT     [employer] ACTIVA PC  . 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  Marland Kitchen HAND SURGERY Bilateral    joint replacement, bilateral thumbs   Social History   Occupational History  . Occupation: retired    Comment: build Dealer for phones  Tobacco Use  . Smoking status: Former Smoker    Packs/day: 0.50    Years: 5.00    Pack years: 2.50    Types: Cigarettes    Quit date: 03/10/2012    Years since quitting: 6.7  . Smokeless tobacco: Current User    Types: Snuff  . Tobacco comment: Snuff for about 10 years  Substance and Sexual Activity  . Alcohol use: No    Alcohol/week: 0.0 standard drinks  . Drug use: No  . Sexual activity: Not Currently    Birth control/protection: Surgical

## 2019-01-04 DIAGNOSIS — Z9682 Presence of neurostimulator: Secondary | ICD-10-CM | POA: Diagnosis not present

## 2019-01-04 DIAGNOSIS — G119 Hereditary ataxia, unspecified: Secondary | ICD-10-CM | POA: Insufficient documentation

## 2019-01-04 DIAGNOSIS — Z79899 Other long term (current) drug therapy: Secondary | ICD-10-CM | POA: Diagnosis not present

## 2019-01-04 DIAGNOSIS — Z9689 Presence of other specified functional implants: Secondary | ICD-10-CM | POA: Diagnosis not present

## 2019-01-04 DIAGNOSIS — Z4542 Encounter for adjustment and management of neuropacemaker (brain) (peripheral nerve) (spinal cord): Secondary | ICD-10-CM | POA: Diagnosis not present

## 2019-01-04 DIAGNOSIS — G250 Essential tremor: Secondary | ICD-10-CM | POA: Diagnosis not present

## 2019-01-10 ENCOUNTER — Ambulatory Visit (INDEPENDENT_AMBULATORY_CARE_PROVIDER_SITE_OTHER): Payer: Medicare Other | Admitting: Physical Medicine and Rehabilitation

## 2019-01-10 ENCOUNTER — Encounter: Payer: Self-pay | Admitting: Physical Medicine and Rehabilitation

## 2019-01-10 ENCOUNTER — Telehealth: Payer: Self-pay | Admitting: Gastroenterology

## 2019-01-10 ENCOUNTER — Ambulatory Visit: Payer: Self-pay

## 2019-01-10 ENCOUNTER — Other Ambulatory Visit: Payer: Self-pay

## 2019-01-10 VITALS — BP 140/71 | HR 65

## 2019-01-10 DIAGNOSIS — M48062 Spinal stenosis, lumbar region with neurogenic claudication: Secondary | ICD-10-CM | POA: Diagnosis not present

## 2019-01-10 DIAGNOSIS — M5416 Radiculopathy, lumbar region: Secondary | ICD-10-CM

## 2019-01-10 MED ORDER — BETAMETHASONE SOD PHOS & ACET 6 (3-3) MG/ML IJ SUSP
12.0000 mg | Freq: Once | INTRAMUSCULAR | Status: AC
  Administered 2019-01-10: 08:00:00 12 mg

## 2019-01-10 NOTE — Telephone Encounter (Signed)
Please request last stool hemoccult or ifobt from PCP

## 2019-01-10 NOTE — Progress Notes (Signed)
Numeric Pain Rating Scale and Functional Assessment Average Pain 10   In the last MONTH (on 0-10 scale) has pain interfered with the following?  1. General activity like being  able to carry out your everyday physical activities such as walking, climbing stairs, carrying groceries, or moving a chair?  Rating(10)   +Driver, -BT, -Dye Allergies. 

## 2019-01-10 NOTE — Procedures (Signed)
Lumbosacral Transforaminal Epidural Steroid Injection - Sub-Pedicular Approach with Fluoroscopic Guidance  Patient: Beth Young      Date of Birth: 01/03/1946 MRN: [id number] PCP: Theresa Duty, PA      Visit Date: 01/10/2019   Universal Protocol:    Date/Time: 01/10/2019  Consent Given By: the patient  Position: PRONE  Additional Comments: Vital signs were monitored before and after the procedure. Patient was prepped and draped in the usual sterile fashion. The correct patient, procedure, and site was verified.   Injection Procedure Details:  Procedure Site One Meds Administered:  Meds ordered this encounter  Medications  . betamethasone acetate-betamethasone sodium phosphate (CELESTONE) injection 12 mg    Laterality: Right  Location/Site:  L4-L5  Needle size: 22 G  Needle type: Spinal  Needle Placement: Transforaminal  Findings:    -Comments: Excellent flow of contrast along the nerve and into the epidural space.  Procedure Details: After squaring off the end-plates to get a true AP view, the C-arm was positioned so that an oblique view of the foramen as noted above was visualized. The target area is just inferior to the "nose of the scotty dog" or sub pedicular. The soft tissues overlying this structure were infiltrated with 2-3 ml. of 1% Lidocaine without Epinephrine.  The spinal needle was inserted toward the target using a "trajectory" view along the fluoroscope beam.  Under AP and lateral visualization, the needle was advanced so it did not puncture dura and was located close the 6 O'Clock position of the pedical in AP tracterory. Biplanar projections were used to confirm position. Aspiration was confirmed to be negative for CSF and/or blood. A 1-2 ml. volume of Isovue-250 was injected and flow of contrast was noted at each level. Radiographs were obtained for documentation purposes.   After attaining the desired flow of contrast documented above, a 0.5 to  1.0 ml test dose of 0.25% Marcaine was injected into each respective transforaminal space.  The patient was observed for 90 seconds post injection.  After no sensory deficits were reported, and normal lower extremity motor function was noted,   the above injectate was administered so that equal amounts of the injectate were placed at each foramen (level) into the transforaminal epidural space.   Additional Comments:  The patient tolerated the procedure well Dressing: 2 x 2 sterile gauze and Band-Aid    Post-procedure details: Patient was observed during the procedure. Post-procedure instructions were reviewed.  Patient left the clinic in stable condition.

## 2019-01-10 NOTE — Progress Notes (Signed)
Beth HamperValinda F Young - 73 y.o. female MRN [id number]  Date of birth: March 03, 1946  Office Visit Note: Visit Date: 01/10/2019 PCP: Ila McgillAllwardt, Alyssa, PA Referred by: Allwardt, Alyssa, PA  Subjective: Chief Complaint  Patient presents with  . Lower Back - Pain   HPI: Beth Young is a 73 y.o. female who comes in today At the request of Dr. Norlene CampbellPeter Whitfield for right L4 transforaminal epidural steroid injection.  Patient has complicated case of Parkinson's disease status post deep brain stimulator which is now implanted in her abdomen.  She has CT scan showing lumbar stenosis at L4-5 which is moderate.  This could look worse on MRI depending on ligamentum thickening etc.  She is having right more than left low back pain with radicular leg pain.  She is taking tramadol without much relief.  She reports 10 out of 10 pain affecting her daily living.  She has had no prior injections.  No prior lumbar surgery.  We will complete diagnostic of therapeutic injection.  If she does well with injection could look at intermittent injection over time and see if this helps for extended periods.  She is on a anticoagulant which is okay for this type of injection.  She also has a diagnosis of diabetes and we did talk about increased blood sugar with the injection of corticosteroid.  ROS Otherwise per HPI.  Assessment & Plan: Visit Diagnoses:  1. Lumbar radiculopathy   2. Spinal stenosis of lumbar region with neurogenic claudication     Plan: No additional findings.   Meds & Orders:  Meds ordered this encounter  Medications  . betamethasone acetate-betamethasone sodium phosphate (CELESTONE) injection 12 mg    Orders Placed This Encounter  Procedures  . XR C-ARM NO REPORT  . Epidural Steroid injection    Follow-up: Return if symptoms worsen or fail to improve.   Procedures: No procedures performed  Lumbosacral Transforaminal Epidural Steroid Injection - Sub-Pedicular Approach with Fluoroscopic Guidance   Patient: Beth Young      Date of Birth: March 03, 1946 MRN: [id number] PCP: Ila McgillAllwardt, Alyssa, PA      Visit Date: 01/10/2019   Universal Protocol:    Date/Time: 01/10/2019  Consent Given By: the patient  Position: PRONE  Additional Comments: Vital signs were monitored before and after the procedure. Patient was prepped and draped in the usual sterile fashion. The correct patient, procedure, and site was verified.   Injection Procedure Details:  Procedure Site One Meds Administered:  Meds ordered this encounter  Medications  . betamethasone acetate-betamethasone sodium phosphate (CELESTONE) injection 12 mg    Laterality: Right  Location/Site:  L4-L5  Needle size: 22 G  Needle type: Spinal  Needle Placement: Transforaminal  Findings:    -Comments: Excellent flow of contrast along the nerve and into the epidural space.  Procedure Details: After squaring off the end-plates to get a true AP view, the C-arm was positioned so that an oblique view of the foramen as noted above was visualized. The target area is just inferior to the "nose of the scotty dog" or sub pedicular. The soft tissues overlying this structure were infiltrated with 2-3 ml. of 1% Lidocaine without Epinephrine.  The spinal needle was inserted toward the target using a "trajectory" view along the fluoroscope beam.  Under AP and lateral visualization, the needle was advanced so it did not puncture dura and was located close the 6 O'Clock position of the pedical in AP tracterory. Biplanar projections were used to confirm position. Aspiration  was confirmed to be negative for CSF and/or blood. A 1-2 ml. volume of Isovue-250 was injected and flow of contrast was noted at each level. Radiographs were obtained for documentation purposes.   After attaining the desired flow of contrast documented above, a 0.5 to 1.0 ml test dose of 0.25% Marcaine was injected into each respective transforaminal space.  The patient was  observed for 90 seconds post injection.  After no sensory deficits were reported, and normal lower extremity motor function was noted,   the above injectate was administered so that equal amounts of the injectate were placed at each foramen (level) into the transforaminal epidural space.   Additional Comments:  The patient tolerated the procedure well Dressing: 2 x 2 sterile gauze and Band-Aid    Post-procedure details: Patient was observed during the procedure. Post-procedure instructions were reviewed.  Patient left the clinic in stable condition.     Clinical History: CT LUMBAR SPINE WITHOUT CONTRAST  TECHNIQUE: Multidetector CT imaging of the lumbar spine was performed without intravenous contrast administration. Multiplanar CT image reconstructions were also generated.  COMPARISON:  Plain films lumbar spine from [facility] 09/28/2018.  FINDINGS: Segmentation: Standard.  Alignment: Normal.  Vertebrae: No acute fracture or focal pathologic process.  Paraspinal and other soft tissues: Atherosclerotic vascular disease noted.  Disc levels: T12-L1: Negative.  L1-2: Negative.  L2-3: Mild facet degenerative change and ligamentum flavum thickening. No disc bulge or protrusion. No stenosis.  L3-4: Negative.  L4-5: Mild-to-moderate facet arthropathy is worse on the left. There is a shallow disc bulge and ligamentum flavum thickening. Moderate central canal stenosis is seen. The foramina are open.  L5-S1: Mild-to-moderate facet degenerative change and a shallow broad-based central protrusion without stenosis.  IMPRESSION: Negative for fracture.  No acute finding.  Spondylosis most notable at L4-5 where a shallow disc bulge and ligamentum flavum thickening cause moderate central canal narrowing.   Electronically Signed   By: Drusilla Kanner M.D.   On: 11/08/2018 10:32   She reports that she quit smoking about 6 years ago. Her  smoking use included cigarettes. She has a 2.50 pack-year smoking history. Her smokeless tobacco use includes snuff. No results for input(s): HGBA1C, LABURIC in the last 8760 hours.  Objective:  VS:  HT:    WT:   BMI:     BP:140/71  HR:65bpm  TEMP: ( )  RESP:  Physical Exam  Ortho Exam Imaging: Xr C-arm No Report  Result Date: 01/10/2019 Please see Notes tab for imaging impression.   Past Medical/Family/Surgical/Social History: Medications & Allergies reviewed per EMR, new medications updated. Patient Active Problem List   Diagnosis Date Noted  . Anemia 11/12/2018  . Low back pain 10/20/2018  . Fecal incontinence 04/14/2017  . Diarrhea 12/11/2016  . Essential tremor 04/17/2014  . S/P deep brain stimulator placement 04/17/2014  . Restless leg 01/15/2012  . Diabetes mellitus, type 2 (HCC) 06/02/2011  . CAFL (chronic airflow limitation) (HCC) 10/02/2010  . Acid reflux 10/02/2010  . HLD (hyperlipidemia) 10/02/2010  . 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 Anxiety   . Arthritis   . Asthma   . Depression   . Essential tremor    with implantable brain stimulator device  . GERD (gastroesophageal reflux disease)   . History of stroke   . Hyperlipidemia   . Hypertension   . Insomnia   . Osteoporosis   . 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  Restless legs syndrome   . Type 2 diabetes mellitus (HCC)    Family History  Problem Relation Age of Onset  . Dementia Mother   . Heart attack Father   . Breast cancer Sister   . Other Son        ???celiac  . Colon cancer Neg Hx   . Gastric cancer Neg Hx   . Esophageal cancer Neg Hx    Past Surgical History:  Procedure Laterality Date  . ABDOMINAL HYSTERECTOMY    . ANKLE SURGERY Right   . BIOPSY  05/04/2017   Procedure: BIOPSY;  Surgeon: Daneil Dolin, MD;  Location: [facility];  Service: Endoscopy;;  ascending, descending, sigmoid bx's  .  BURR HOLE W/ STEREOTACTIC INSERTION OF DBS LEADS / INTRAOP MICROELECTRODE RECORDING  04/03/2012  . CATARACT EXTRACTION, BILATERAL    . COLONOSCOPY WITH PROPOFOL N/A 05/04/2017   Dr. Gala Romney: diverticulosis, random colon biopsies were negative.   Marland Kitchen DEEP BRAIN STIMULATOR PLACEMENT     [employer] ACTIVA PC  . 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  Marland Kitchen HAND SURGERY Bilateral    joint replacement, bilateral thumbs   Social History   Occupational History  . Occupation: retired    Comment: build Psychologist, counselling for phones  Tobacco Use  . Smoking status: Former Smoker    Packs/day: 0.50    Years: 5.00    Pack years: 2.50    Types: Cigarettes    Quit date: 03/10/2012    Years since quitting: 6.8  . Smokeless tobacco: Current User    Types: Snuff  . Tobacco comment: Snuff for about 10 years  Substance and Sexual Activity  . Alcohol use: No    Alcohol/week: 0.0 standard drinks  . Drug use: No  . Sexual activity: Not Currently    Birth control/protection: Surgical

## 2019-01-11 NOTE — Telephone Encounter (Signed)
requested

## 2019-01-27 DIAGNOSIS — E119 Type 2 diabetes mellitus without complications: Secondary | ICD-10-CM | POA: Diagnosis not present

## 2019-01-27 DIAGNOSIS — M5441 Lumbago with sciatica, right side: Secondary | ICD-10-CM | POA: Diagnosis not present

## 2019-01-27 DIAGNOSIS — Z1389 Encounter for screening for other disorder: Secondary | ICD-10-CM | POA: Diagnosis not present

## 2019-01-27 DIAGNOSIS — K529 Noninfective gastroenteritis and colitis, unspecified: Secondary | ICD-10-CM | POA: Diagnosis not present

## 2019-01-27 DIAGNOSIS — Z9181 History of falling: Secondary | ICD-10-CM | POA: Diagnosis not present

## 2019-01-27 DIAGNOSIS — Z6824 Body mass index (BMI) 24.0-24.9, adult: Secondary | ICD-10-CM | POA: Diagnosis not present

## 2019-01-27 DIAGNOSIS — I10 Essential (primary) hypertension: Secondary | ICD-10-CM | POA: Diagnosis not present

## 2019-02-07 DIAGNOSIS — I10 Essential (primary) hypertension: Secondary | ICD-10-CM | POA: Diagnosis not present

## 2019-02-07 DIAGNOSIS — E782 Mixed hyperlipidemia: Secondary | ICD-10-CM | POA: Diagnosis not present

## 2019-03-07 DIAGNOSIS — I10 Essential (primary) hypertension: Secondary | ICD-10-CM | POA: Diagnosis not present

## 2019-03-10 DIAGNOSIS — I10 Essential (primary) hypertension: Secondary | ICD-10-CM | POA: Diagnosis not present

## 2019-03-10 DIAGNOSIS — E782 Mixed hyperlipidemia: Secondary | ICD-10-CM | POA: Diagnosis not present

## 2019-04-08 DIAGNOSIS — G20 Parkinson's disease: Secondary | ICD-10-CM | POA: Diagnosis not present

## 2019-04-08 DIAGNOSIS — I10 Essential (primary) hypertension: Secondary | ICD-10-CM | POA: Diagnosis not present

## 2019-04-12 ENCOUNTER — Other Ambulatory Visit: Payer: Self-pay

## 2019-04-12 NOTE — Patient Outreach (Signed)
[facility] Customer service manager [facility]) Care Management  04/12/2019  Beth Young 05-Jul-1945 [id number]   Medication Adherence call to Mrs. Beth Young Hippa Identifiers Verify spoke with patient she is past due on Atorvastatin 80 mg and Lisinopril 20 mg,patient explain she take 1 tablet daily and has received her medication from the pharmacy,patient receives a pill pack every month. [name] is showing past due under [place] [employer].   Lillia Abed CPhT Pharmacy Technician [facility] Management Direct Dial [phone]  Fax [phone] [email or web address]

## 2019-04-21 DIAGNOSIS — I10 Essential (primary) hypertension: Secondary | ICD-10-CM | POA: Diagnosis not present

## 2019-04-21 DIAGNOSIS — K219 Gastro-esophageal reflux disease without esophagitis: Secondary | ICD-10-CM | POA: Diagnosis not present

## 2019-04-21 DIAGNOSIS — E119 Type 2 diabetes mellitus without complications: Secondary | ICD-10-CM | POA: Diagnosis not present

## 2019-04-25 DIAGNOSIS — K529 Noninfective gastroenteritis and colitis, unspecified: Secondary | ICD-10-CM | POA: Diagnosis not present

## 2019-04-25 DIAGNOSIS — E782 Mixed hyperlipidemia: Secondary | ICD-10-CM | POA: Diagnosis not present

## 2019-04-25 DIAGNOSIS — G250 Essential tremor: Secondary | ICD-10-CM | POA: Diagnosis not present

## 2019-04-25 DIAGNOSIS — I10 Essential (primary) hypertension: Secondary | ICD-10-CM | POA: Diagnosis not present

## 2019-04-25 DIAGNOSIS — G20 Parkinson's disease: Secondary | ICD-10-CM | POA: Diagnosis not present

## 2019-05-05 DIAGNOSIS — I10 Essential (primary) hypertension: Secondary | ICD-10-CM | POA: Diagnosis not present

## 2019-05-05 DIAGNOSIS — E7849 Other hyperlipidemia: Secondary | ICD-10-CM | POA: Diagnosis not present

## 2019-05-06 DIAGNOSIS — E7849 Other hyperlipidemia: Secondary | ICD-10-CM | POA: Diagnosis not present

## 2019-05-06 DIAGNOSIS — I10 Essential (primary) hypertension: Secondary | ICD-10-CM | POA: Diagnosis not present

## 2019-05-26 DIAGNOSIS — Z9689 Presence of other specified functional implants: Secondary | ICD-10-CM | POA: Diagnosis not present

## 2019-05-26 DIAGNOSIS — Z01812 Encounter for preprocedural laboratory examination: Secondary | ICD-10-CM | POA: Diagnosis not present

## 2019-05-26 DIAGNOSIS — E119 Type 2 diabetes mellitus without complications: Secondary | ICD-10-CM | POA: Diagnosis not present

## 2019-05-26 DIAGNOSIS — G250 Essential tremor: Secondary | ICD-10-CM | POA: Diagnosis not present

## 2019-05-26 DIAGNOSIS — Z794 Long term (current) use of insulin: Secondary | ICD-10-CM | POA: Diagnosis not present

## 2019-05-26 DIAGNOSIS — Z20822 Contact with and (suspected) exposure to covid-19: Secondary | ICD-10-CM | POA: Diagnosis not present

## 2019-05-31 DIAGNOSIS — G8929 Other chronic pain: Secondary | ICD-10-CM | POA: Insufficient documentation

## 2019-05-31 DIAGNOSIS — R29890 Loss of height: Secondary | ICD-10-CM | POA: Diagnosis not present

## 2019-05-31 DIAGNOSIS — M545 Low back pain, unspecified: Secondary | ICD-10-CM | POA: Insufficient documentation

## 2019-05-31 DIAGNOSIS — M25619 Stiffness of unspecified shoulder, not elsewhere classified: Secondary | ICD-10-CM | POA: Insufficient documentation

## 2019-05-31 DIAGNOSIS — Z87891 Personal history of nicotine dependence: Secondary | ICD-10-CM | POA: Diagnosis not present

## 2019-05-31 DIAGNOSIS — M6281 Muscle weakness (generalized): Secondary | ICD-10-CM | POA: Insufficient documentation

## 2019-05-31 DIAGNOSIS — M19019 Primary osteoarthritis, unspecified shoulder: Secondary | ICD-10-CM | POA: Insufficient documentation

## 2019-05-31 DIAGNOSIS — M48062 Spinal stenosis, lumbar region with neurogenic claudication: Secondary | ICD-10-CM | POA: Diagnosis not present

## 2019-05-31 DIAGNOSIS — Z9689 Presence of other specified functional implants: Secondary | ICD-10-CM | POA: Diagnosis not present

## 2019-05-31 DIAGNOSIS — M4800 Spinal stenosis, site unspecified: Secondary | ICD-10-CM | POA: Diagnosis not present

## 2019-05-31 DIAGNOSIS — M5136 Other intervertebral disc degeneration, lumbar region: Secondary | ICD-10-CM | POA: Diagnosis not present

## 2019-05-31 DIAGNOSIS — G9519 Other vascular myelopathies: Secondary | ICD-10-CM | POA: Insufficient documentation

## 2019-05-31 DIAGNOSIS — M5030 Other cervical disc degeneration, unspecified cervical region: Secondary | ICD-10-CM | POA: Insufficient documentation

## 2019-05-31 DIAGNOSIS — M47816 Spondylosis without myelopathy or radiculopathy, lumbar region: Secondary | ICD-10-CM | POA: Diagnosis not present

## 2019-05-31 DIAGNOSIS — M5137 Other intervertebral disc degeneration, lumbosacral region: Secondary | ICD-10-CM | POA: Diagnosis not present

## 2019-05-31 DIAGNOSIS — M25519 Pain in unspecified shoulder: Secondary | ICD-10-CM | POA: Insufficient documentation

## 2019-05-31 DIAGNOSIS — I700 Atherosclerosis of aorta: Secondary | ICD-10-CM | POA: Diagnosis not present

## 2019-05-31 DIAGNOSIS — R109 Unspecified abdominal pain: Secondary | ICD-10-CM | POA: Insufficient documentation

## 2019-05-31 DIAGNOSIS — E538 Deficiency of other specified B group vitamins: Secondary | ICD-10-CM | POA: Insufficient documentation

## 2019-06-02 DIAGNOSIS — I10 Essential (primary) hypertension: Secondary | ICD-10-CM | POA: Diagnosis not present

## 2019-06-02 DIAGNOSIS — G250 Essential tremor: Secondary | ICD-10-CM | POA: Diagnosis not present

## 2019-06-02 DIAGNOSIS — Z87891 Personal history of nicotine dependence: Secondary | ICD-10-CM | POA: Diagnosis not present

## 2019-06-02 DIAGNOSIS — Z8673 Personal history of transient ischemic attack (TIA), and cerebral infarction without residual deficits: Secondary | ICD-10-CM | POA: Diagnosis not present

## 2019-06-02 DIAGNOSIS — E785 Hyperlipidemia, unspecified: Secondary | ICD-10-CM | POA: Diagnosis not present

## 2019-06-02 DIAGNOSIS — T85193A Other mechanical complication of implanted electronic neurostimulator, generator, initial encounter: Secondary | ICD-10-CM | POA: Diagnosis not present

## 2019-06-02 DIAGNOSIS — Z4542 Encounter for adjustment and management of neuropacemaker (brain) (peripheral nerve) (spinal cord): Secondary | ICD-10-CM | POA: Diagnosis not present

## 2019-06-02 DIAGNOSIS — E119 Type 2 diabetes mellitus without complications: Secondary | ICD-10-CM | POA: Diagnosis not present

## 2019-06-02 DIAGNOSIS — J449 Chronic obstructive pulmonary disease, unspecified: Secondary | ICD-10-CM | POA: Diagnosis not present

## 2019-06-02 DIAGNOSIS — Z79899 Other long term (current) drug therapy: Secondary | ICD-10-CM | POA: Diagnosis not present

## 2019-06-07 DIAGNOSIS — I10 Essential (primary) hypertension: Secondary | ICD-10-CM | POA: Diagnosis not present

## 2019-06-07 DIAGNOSIS — E7849 Other hyperlipidemia: Secondary | ICD-10-CM | POA: Diagnosis not present

## 2019-06-08 DIAGNOSIS — E7849 Other hyperlipidemia: Secondary | ICD-10-CM | POA: Diagnosis not present

## 2019-06-08 DIAGNOSIS — I10 Essential (primary) hypertension: Secondary | ICD-10-CM | POA: Diagnosis not present

## 2019-06-10 DIAGNOSIS — G8929 Other chronic pain: Secondary | ICD-10-CM | POA: Diagnosis not present

## 2019-06-10 DIAGNOSIS — M5136 Other intervertebral disc degeneration, lumbar region: Secondary | ICD-10-CM | POA: Diagnosis not present

## 2019-06-10 DIAGNOSIS — M545 Low back pain: Secondary | ICD-10-CM | POA: Diagnosis not present

## 2019-06-16 DIAGNOSIS — Z48811 Encounter for surgical aftercare following surgery on the nervous system: Secondary | ICD-10-CM | POA: Diagnosis not present

## 2019-06-16 DIAGNOSIS — Z9682 Presence of neurostimulator: Secondary | ICD-10-CM | POA: Diagnosis not present

## 2019-06-22 IMAGING — NM NM MYOCAR MULTI W/SPECT W/WALL MOTION & EF
2 series · 12 of 12 positions shown · non-contrast
Comparison: none

[Series 1: rest · 6.51mm/px · 6 of 64 frames shown]
[frame 6/64]
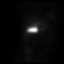
[frame 16/64]
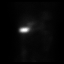
[frame 27/64]
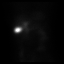
[frame 38/64]
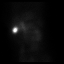
[frame 48/64]
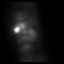
[frame 59/64]
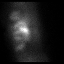

[Series 3: stress gated - perfusion · 6.51mm/px · 6 of 64 frames shown]
[frame 6/64]
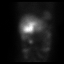
[frame 16/64]
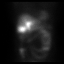
[frame 27/64]
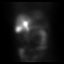
[frame 38/64]
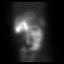
[frame 48/64]
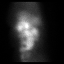
[frame 59/64]
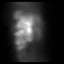

[12 of 12 positions shown; findings below may reference images not displayed]

Canned report from images found in remote index.

Refer to host system for actual result text.

## 2019-06-23 DIAGNOSIS — M47816 Spondylosis without myelopathy or radiculopathy, lumbar region: Secondary | ICD-10-CM | POA: Diagnosis not present

## 2019-06-23 DIAGNOSIS — M2578 Osteophyte, vertebrae: Secondary | ICD-10-CM | POA: Diagnosis not present

## 2019-06-23 DIAGNOSIS — M47817 Spondylosis without myelopathy or radiculopathy, lumbosacral region: Secondary | ICD-10-CM | POA: Diagnosis not present

## 2019-06-23 DIAGNOSIS — M545 Low back pain: Secondary | ICD-10-CM | POA: Diagnosis not present

## 2019-06-23 DIAGNOSIS — G8929 Other chronic pain: Secondary | ICD-10-CM | POA: Diagnosis not present

## 2019-06-23 DIAGNOSIS — Z9682 Presence of neurostimulator: Secondary | ICD-10-CM | POA: Diagnosis not present

## 2019-07-07 DIAGNOSIS — G4700 Insomnia, unspecified: Secondary | ICD-10-CM | POA: Diagnosis not present

## 2019-07-07 DIAGNOSIS — I10 Essential (primary) hypertension: Secondary | ICD-10-CM | POA: Diagnosis not present

## 2019-07-08 DIAGNOSIS — I10 Essential (primary) hypertension: Secondary | ICD-10-CM | POA: Diagnosis not present

## 2019-07-08 DIAGNOSIS — G4700 Insomnia, unspecified: Secondary | ICD-10-CM | POA: Diagnosis not present

## 2019-07-21 DIAGNOSIS — I10 Essential (primary) hypertension: Secondary | ICD-10-CM | POA: Diagnosis not present

## 2019-07-21 DIAGNOSIS — E119 Type 2 diabetes mellitus without complications: Secondary | ICD-10-CM | POA: Diagnosis not present

## 2019-07-21 DIAGNOSIS — Z23 Encounter for immunization: Secondary | ICD-10-CM | POA: Diagnosis not present

## 2019-07-21 DIAGNOSIS — Z0001 Encounter for general adult medical examination with abnormal findings: Secondary | ICD-10-CM | POA: Diagnosis not present

## 2019-07-21 DIAGNOSIS — Z6824 Body mass index (BMI) 24.0-24.9, adult: Secondary | ICD-10-CM | POA: Diagnosis not present

## 2019-07-21 DIAGNOSIS — M7500 Adhesive capsulitis of unspecified shoulder: Secondary | ICD-10-CM | POA: Diagnosis not present

## 2019-07-21 DIAGNOSIS — M25511 Pain in right shoulder: Secondary | ICD-10-CM | POA: Diagnosis not present

## 2019-07-21 DIAGNOSIS — E782 Mixed hyperlipidemia: Secondary | ICD-10-CM | POA: Diagnosis not present

## 2019-07-21 DIAGNOSIS — G20 Parkinson's disease: Secondary | ICD-10-CM | POA: Diagnosis not present

## 2019-07-25 DIAGNOSIS — M7501 Adhesive capsulitis of right shoulder: Secondary | ICD-10-CM | POA: Diagnosis not present

## 2019-08-08 DIAGNOSIS — J441 Chronic obstructive pulmonary disease with (acute) exacerbation: Secondary | ICD-10-CM | POA: Diagnosis not present

## 2019-08-08 DIAGNOSIS — E119 Type 2 diabetes mellitus without complications: Secondary | ICD-10-CM | POA: Diagnosis not present

## 2019-08-08 DIAGNOSIS — Z87891 Personal history of nicotine dependence: Secondary | ICD-10-CM | POA: Diagnosis not present

## 2019-08-08 DIAGNOSIS — E7849 Other hyperlipidemia: Secondary | ICD-10-CM | POA: Diagnosis not present

## 2019-08-08 DIAGNOSIS — I10 Essential (primary) hypertension: Secondary | ICD-10-CM | POA: Diagnosis not present

## 2019-08-22 DIAGNOSIS — M7501 Adhesive capsulitis of right shoulder: Secondary | ICD-10-CM | POA: Diagnosis not present

## 2019-08-25 DIAGNOSIS — M810 Age-related osteoporosis without current pathological fracture: Secondary | ICD-10-CM | POA: Diagnosis not present

## 2019-08-25 DIAGNOSIS — M533 Sacrococcygeal disorders, not elsewhere classified: Secondary | ICD-10-CM | POA: Diagnosis not present

## 2019-08-25 DIAGNOSIS — M5432 Sciatica, left side: Secondary | ICD-10-CM | POA: Diagnosis not present

## 2019-08-25 DIAGNOSIS — Z6824 Body mass index (BMI) 24.0-24.9, adult: Secondary | ICD-10-CM | POA: Diagnosis not present

## 2019-09-01 DIAGNOSIS — M533 Sacrococcygeal disorders, not elsewhere classified: Secondary | ICD-10-CM | POA: Diagnosis not present

## 2019-09-01 DIAGNOSIS — M810 Age-related osteoporosis without current pathological fracture: Secondary | ICD-10-CM | POA: Diagnosis not present

## 2019-09-01 DIAGNOSIS — M5432 Sciatica, left side: Secondary | ICD-10-CM | POA: Diagnosis not present

## 2019-09-07 DIAGNOSIS — Z87891 Personal history of nicotine dependence: Secondary | ICD-10-CM | POA: Diagnosis not present

## 2019-09-07 DIAGNOSIS — I10 Essential (primary) hypertension: Secondary | ICD-10-CM | POA: Diagnosis not present

## 2019-09-07 DIAGNOSIS — E119 Type 2 diabetes mellitus without complications: Secondary | ICD-10-CM | POA: Diagnosis not present

## 2019-09-07 DIAGNOSIS — E7849 Other hyperlipidemia: Secondary | ICD-10-CM | POA: Diagnosis not present

## 2019-09-07 DIAGNOSIS — J441 Chronic obstructive pulmonary disease with (acute) exacerbation: Secondary | ICD-10-CM | POA: Diagnosis not present

## 2019-10-20 DIAGNOSIS — Z1159 Encounter for screening for other viral diseases: Secondary | ICD-10-CM | POA: Diagnosis not present

## 2019-10-20 DIAGNOSIS — I10 Essential (primary) hypertension: Secondary | ICD-10-CM | POA: Diagnosis not present

## 2019-10-20 DIAGNOSIS — K219 Gastro-esophageal reflux disease without esophagitis: Secondary | ICD-10-CM | POA: Diagnosis not present

## 2019-10-20 DIAGNOSIS — E782 Mixed hyperlipidemia: Secondary | ICD-10-CM | POA: Diagnosis not present

## 2019-10-24 DIAGNOSIS — I10 Essential (primary) hypertension: Secondary | ICD-10-CM | POA: Diagnosis not present

## 2019-10-24 DIAGNOSIS — G250 Essential tremor: Secondary | ICD-10-CM | POA: Diagnosis not present

## 2019-10-24 DIAGNOSIS — E782 Mixed hyperlipidemia: Secondary | ICD-10-CM | POA: Diagnosis not present

## 2019-10-24 DIAGNOSIS — E119 Type 2 diabetes mellitus without complications: Secondary | ICD-10-CM | POA: Diagnosis not present

## 2019-11-08 DIAGNOSIS — I10 Essential (primary) hypertension: Secondary | ICD-10-CM | POA: Diagnosis not present

## 2019-11-08 DIAGNOSIS — J441 Chronic obstructive pulmonary disease with (acute) exacerbation: Secondary | ICD-10-CM | POA: Diagnosis not present

## 2019-11-08 DIAGNOSIS — E7849 Other hyperlipidemia: Secondary | ICD-10-CM | POA: Diagnosis not present

## 2019-11-08 DIAGNOSIS — Z87891 Personal history of nicotine dependence: Secondary | ICD-10-CM | POA: Diagnosis not present

## 2019-11-08 DIAGNOSIS — E119 Type 2 diabetes mellitus without complications: Secondary | ICD-10-CM | POA: Diagnosis not present

## 2019-11-10 DIAGNOSIS — Z23 Encounter for immunization: Secondary | ICD-10-CM | POA: Diagnosis not present

## 2019-11-16 DIAGNOSIS — M25561 Pain in right knee: Secondary | ICD-10-CM | POA: Diagnosis not present

## 2019-11-16 DIAGNOSIS — M25369 Other instability, unspecified knee: Secondary | ICD-10-CM | POA: Diagnosis not present

## 2019-12-06 DIAGNOSIS — M810 Age-related osteoporosis without current pathological fracture: Secondary | ICD-10-CM | POA: Diagnosis not present

## 2019-12-08 DIAGNOSIS — E119 Type 2 diabetes mellitus without complications: Secondary | ICD-10-CM | POA: Diagnosis not present

## 2019-12-08 DIAGNOSIS — Z87891 Personal history of nicotine dependence: Secondary | ICD-10-CM | POA: Diagnosis not present

## 2019-12-08 DIAGNOSIS — Z23 Encounter for immunization: Secondary | ICD-10-CM | POA: Diagnosis not present

## 2019-12-08 DIAGNOSIS — J441 Chronic obstructive pulmonary disease with (acute) exacerbation: Secondary | ICD-10-CM | POA: Diagnosis not present

## 2019-12-08 DIAGNOSIS — I10 Essential (primary) hypertension: Secondary | ICD-10-CM | POA: Diagnosis not present

## 2019-12-08 DIAGNOSIS — E7849 Other hyperlipidemia: Secondary | ICD-10-CM | POA: Diagnosis not present

## 2019-12-27 DIAGNOSIS — Z885 Allergy status to narcotic agent status: Secondary | ICD-10-CM | POA: Diagnosis not present

## 2019-12-27 DIAGNOSIS — G250 Essential tremor: Secondary | ICD-10-CM | POA: Diagnosis not present

## 2019-12-27 DIAGNOSIS — Z888 Allergy status to other drugs, medicaments and biological substances status: Secondary | ICD-10-CM | POA: Diagnosis not present

## 2019-12-27 DIAGNOSIS — G9519 Other vascular myelopathies: Secondary | ICD-10-CM | POA: Diagnosis not present

## 2019-12-27 DIAGNOSIS — Z9689 Presence of other specified functional implants: Secondary | ICD-10-CM | POA: Diagnosis not present

## 2019-12-27 DIAGNOSIS — Z881 Allergy status to other antibiotic agents status: Secondary | ICD-10-CM | POA: Diagnosis not present

## 2019-12-27 DIAGNOSIS — Z880 Allergy status to penicillin: Secondary | ICD-10-CM | POA: Diagnosis not present

## 2019-12-27 DIAGNOSIS — Z882 Allergy status to sulfonamides status: Secondary | ICD-10-CM | POA: Diagnosis not present

## 2019-12-27 DIAGNOSIS — Z9682 Presence of neurostimulator: Secondary | ICD-10-CM | POA: Diagnosis not present

## 2020-01-07 DIAGNOSIS — I10 Essential (primary) hypertension: Secondary | ICD-10-CM | POA: Diagnosis not present

## 2020-01-07 DIAGNOSIS — E7849 Other hyperlipidemia: Secondary | ICD-10-CM | POA: Diagnosis not present

## 2020-01-07 DIAGNOSIS — E119 Type 2 diabetes mellitus without complications: Secondary | ICD-10-CM | POA: Diagnosis not present

## 2020-01-07 DIAGNOSIS — Z87891 Personal history of nicotine dependence: Secondary | ICD-10-CM | POA: Diagnosis not present

## 2020-01-07 DIAGNOSIS — J441 Chronic obstructive pulmonary disease with (acute) exacerbation: Secondary | ICD-10-CM | POA: Diagnosis not present

## 2020-01-19 DIAGNOSIS — G20 Parkinson's disease: Secondary | ICD-10-CM | POA: Diagnosis not present

## 2020-01-19 DIAGNOSIS — I10 Essential (primary) hypertension: Secondary | ICD-10-CM | POA: Diagnosis not present

## 2020-01-19 DIAGNOSIS — K219 Gastro-esophageal reflux disease without esophagitis: Secondary | ICD-10-CM | POA: Diagnosis not present

## 2020-01-19 DIAGNOSIS — E782 Mixed hyperlipidemia: Secondary | ICD-10-CM | POA: Diagnosis not present

## 2020-01-19 DIAGNOSIS — E119 Type 2 diabetes mellitus without complications: Secondary | ICD-10-CM | POA: Diagnosis not present

## 2020-01-23 DIAGNOSIS — Z23 Encounter for immunization: Secondary | ICD-10-CM | POA: Diagnosis not present

## 2020-01-23 DIAGNOSIS — G4700 Insomnia, unspecified: Secondary | ICD-10-CM | POA: Diagnosis not present

## 2020-01-23 DIAGNOSIS — E119 Type 2 diabetes mellitus without complications: Secondary | ICD-10-CM | POA: Diagnosis not present

## 2020-01-23 DIAGNOSIS — G250 Essential tremor: Secondary | ICD-10-CM | POA: Diagnosis not present

## 2020-01-23 DIAGNOSIS — I10 Essential (primary) hypertension: Secondary | ICD-10-CM | POA: Diagnosis not present

## 2020-01-23 DIAGNOSIS — E782 Mixed hyperlipidemia: Secondary | ICD-10-CM | POA: Diagnosis not present

## 2020-01-23 DIAGNOSIS — K529 Noninfective gastroenteritis and colitis, unspecified: Secondary | ICD-10-CM | POA: Diagnosis not present

## 2020-02-07 DIAGNOSIS — E119 Type 2 diabetes mellitus without complications: Secondary | ICD-10-CM | POA: Diagnosis not present

## 2020-02-07 DIAGNOSIS — E7849 Other hyperlipidemia: Secondary | ICD-10-CM | POA: Diagnosis not present

## 2020-02-07 DIAGNOSIS — I10 Essential (primary) hypertension: Secondary | ICD-10-CM | POA: Diagnosis not present

## 2020-02-08 ENCOUNTER — Other Ambulatory Visit: Payer: Self-pay | Admitting: *Deleted

## 2020-02-08 ENCOUNTER — Encounter: Payer: Self-pay | Admitting: *Deleted

## 2020-02-08 NOTE — Patient Outreach (Signed)
[facility] Customer service manager [facility]) Care Management  02/08/2020  Beth Young 1946-02-14 [id number]   Initial telephone outreach, Primary care referral for HTN and DM.  Talked with Mrs. Lamb and she acknowledges these problems but states they are well controlled. She takes her meds, follows a diabetic and low sodium diet.  She says her major concern is her back. She has had some falls and she injured her L3-L4. She needs surgery but she has underlying osteoporosis. She cannot take any narcotic medications because she has allergies to these types of medications. She is allergic to adhesive so a lidoderm patch is out. NSAIDS out because of plavix. She has had steroid injections and it was so painful to her that she never wants to try that again.  She only uses APAP and it does very little. She tries to cope by repositioning, keep moving, using pillows, prayer, listening to gospel music and a little TV. She talks on the phone to her sisters. She doesn't read well so that is not one of her past times.  Discussed some alternative methods like acupuncture and CBD, also asked if she has used voltaren gel which she was not sure. She is skeptical about these, of course.   Outpatient Encounter Medications as of 02/08/2020  Medication Sig Note  . albuterol (PROVENTIL HFA;VENTOLIN HFA) 108 (90 Base) MCG/ACT inhaler Inhale 1 puff into the lungs every 6 (six) hours as needed for wheezing or shortness of breath.   . carbidopa-levodopa (SINEMET IR) 25-100 MG tablet Take 1 tablet by mouth 3 (three) times daily.   . clopidogrel (PLAVIX) 75 MG tablet Take 75 mg by mouth daily.    Marland Kitchen dicyclomine (BENTYL) 10 MG capsule Take 1 capsule (10 mg total) by mouth 2 (two) times daily as needed (diarrhea).   . doxepin (SINEQUAN) 25 MG capsule Take 25 mg by mouth at bedtime.    Marland Kitchen escitalopram (LEXAPRO) 20 MG tablet TAKE 1 TABLET BY MOUTH DAILY FOR DEPRESSION   . ferrous sulfate 325 (65 FE) MG tablet Take 325 mg by mouth daily.    . fluticasone (FLONASE) 50 MCG/ACT nasal spray Place 2 sprays into both nostrils daily as needed for allergies or rhinitis.   Marland Kitchen lisinopril (ZESTRIL) 20 MG tablet Take 20 mg by mouth daily.    . montelukast (SINGULAIR) 10 MG tablet Take 10 mg by mouth daily.    Marland Kitchen omeprazole (PRILOSEC) 20 MG capsule Take 20 mg by mouth daily. TAKE 1 CAPSULE (20 MG TOTAL) BY MOUTH DAILY.   Marland Kitchen propranolol ER (INDERAL LA) 80 MG 24 hr capsule Take 80 mg by mouth daily. For hypertension   . raloxifene (EVISTA) 60 MG tablet Take 60 mg by mouth daily.   Marland Kitchen topiramate (TOPAMAX) 50 MG tablet Take 50 mg by mouth at bedtime.  02/08/2020: Pt has this and is taking it.  Marland Kitchen atorvastatin (LIPITOR) 80 MG tablet Take 80 mg by mouth daily.  02/08/2020: Pt does have this and is taking it.  Marland Kitchen LORazepam (ATIVAN) 1 MG tablet Take 0.5-1 mg by mouth every 6 (six) hours as needed (severe tremors). (Patient not taking: Reported on 02/08/2020)   . tiotropium (SPIRIVA HANDIHALER) 18 MCG inhalation capsule Place into inhaler and inhale. 02/08/2020: Pt has this and is taking it.  . [DISCONTINUED] propranolol (INDERAL) 40 MG tablet Take 40 mg by mouth daily as needed (for severe tremors from migraines). Takes infrequently   . [DISCONTINUED] traMADol (ULTRAM) 50 MG tablet Take 50 mg by mouth every 12 (  twelve) hours as needed (pain).    No facility-administered encounter medications on file as of 02/08/2020.   Depression screen [facility] 2/9 02/08/2020 07/15/2017  Decreased Interest 0 0  Down, Depressed, Hopeless 1 0  PHQ - 2 Score 1 0   Fall Risk  02/08/2020  Falls in the past year? 1  Number falls in past yr: 1  Injury with Fall? 1  Risk for fall due to : History of fall(s);Impaired balance/gait;Medication side effect  Follow up Falls evaluation completed;Education provided   Goals    . Cope with Chronic Pain     Follow Up Date 03/16/20  - learn relaxation techniques - practice acceptance of chronic pain - practice relaxation or meditation daily -  tell myself I can (not I can't) - use distraction techniques - use relaxation during pain    Why is this important?    Stress makes chronic pain feel worse.   Feelings like depression, anxiety, stress and anger can make your body more sensitive to pain.   Learning ways to cope with stress or depression may help you find some relief from the pain.     Notes: Pt does rest but this needs to be developed into an active or mindful practice.    . Manage Pain     Follow Up Date  03/16/20 - develop a personal pain management plan - prioritize tasks for the day - start a pain diary - track times pain is worst and when it is best - track what makes the pain worse and what makes it better    Why is this important?    Day-to-day life can be hard when you have chronic pain.   Pain medicine is just one piece of the treatment puzzle.   You can try these action steps to help you manage your pain.    Notes: Initial assessment of pain done today and alternative therapies discussed.       I think she should try a course of CBD to see if she can tolerate it. [facility] has this and would be a better place for her to buy it than a smoke shop. Perhaps if you have a relationship with the pharmacist there you can discuss it with them for appropriate dosing. I think oil or gummies would be best. The other think is it is not cheap. It is worth a try.  ALYSSA, COULD YOU SEND ME A FULL LIST OF HER ALLERGIES SO I CAN UPDATE HER [facility] EMR? ID LIKE TO KNOW YOUR OPINION ABOUT CBD ALSO. THANKS FOR YOUR COLLABORATION!  I will call Mrs. Shore in 2 weeks or sooner if we can think of something to help this unfortunate lady.  [name]. Burgess Estelle, MSN, [facility] Gerontological Nurse Practitioner [facility] Care Management [phone]

## 2020-02-09 DIAGNOSIS — G4700 Insomnia, unspecified: Secondary | ICD-10-CM | POA: Diagnosis not present

## 2020-02-09 DIAGNOSIS — D649 Anemia, unspecified: Secondary | ICD-10-CM | POA: Diagnosis not present

## 2020-02-09 DIAGNOSIS — G250 Essential tremor: Secondary | ICD-10-CM | POA: Diagnosis not present

## 2020-02-09 DIAGNOSIS — M25511 Pain in right shoulder: Secondary | ICD-10-CM | POA: Diagnosis not present

## 2020-02-09 DIAGNOSIS — E119 Type 2 diabetes mellitus without complications: Secondary | ICD-10-CM | POA: Diagnosis not present

## 2020-02-09 DIAGNOSIS — M5416 Radiculopathy, lumbar region: Secondary | ICD-10-CM | POA: Diagnosis not present

## 2020-02-09 DIAGNOSIS — E782 Mixed hyperlipidemia: Secondary | ICD-10-CM | POA: Diagnosis not present

## 2020-02-09 DIAGNOSIS — G8929 Other chronic pain: Secondary | ICD-10-CM | POA: Diagnosis not present

## 2020-02-09 DIAGNOSIS — I69354 Hemiplegia and hemiparesis following cerebral infarction affecting left non-dominant side: Secondary | ICD-10-CM | POA: Diagnosis not present

## 2020-02-09 DIAGNOSIS — I10 Essential (primary) hypertension: Secondary | ICD-10-CM | POA: Diagnosis not present

## 2020-02-09 DIAGNOSIS — K529 Noninfective gastroenteritis and colitis, unspecified: Secondary | ICD-10-CM | POA: Diagnosis not present

## 2020-02-10 DIAGNOSIS — M5416 Radiculopathy, lumbar region: Secondary | ICD-10-CM | POA: Diagnosis not present

## 2020-02-10 DIAGNOSIS — E119 Type 2 diabetes mellitus without complications: Secondary | ICD-10-CM | POA: Diagnosis not present

## 2020-02-10 DIAGNOSIS — K529 Noninfective gastroenteritis and colitis, unspecified: Secondary | ICD-10-CM | POA: Diagnosis not present

## 2020-02-10 DIAGNOSIS — G4700 Insomnia, unspecified: Secondary | ICD-10-CM | POA: Diagnosis not present

## 2020-02-10 DIAGNOSIS — I69354 Hemiplegia and hemiparesis following cerebral infarction affecting left non-dominant side: Secondary | ICD-10-CM | POA: Diagnosis not present

## 2020-02-10 DIAGNOSIS — G250 Essential tremor: Secondary | ICD-10-CM | POA: Diagnosis not present

## 2020-02-10 DIAGNOSIS — I10 Essential (primary) hypertension: Secondary | ICD-10-CM | POA: Diagnosis not present

## 2020-02-10 DIAGNOSIS — D649 Anemia, unspecified: Secondary | ICD-10-CM | POA: Diagnosis not present

## 2020-02-10 DIAGNOSIS — E782 Mixed hyperlipidemia: Secondary | ICD-10-CM | POA: Diagnosis not present

## 2020-02-10 DIAGNOSIS — G8929 Other chronic pain: Secondary | ICD-10-CM | POA: Diagnosis not present

## 2020-02-10 DIAGNOSIS — M25511 Pain in right shoulder: Secondary | ICD-10-CM | POA: Diagnosis not present

## 2020-02-13 DIAGNOSIS — M25511 Pain in right shoulder: Secondary | ICD-10-CM | POA: Diagnosis not present

## 2020-02-13 DIAGNOSIS — G8929 Other chronic pain: Secondary | ICD-10-CM | POA: Diagnosis not present

## 2020-02-13 DIAGNOSIS — I69354 Hemiplegia and hemiparesis following cerebral infarction affecting left non-dominant side: Secondary | ICD-10-CM | POA: Diagnosis not present

## 2020-02-13 DIAGNOSIS — K529 Noninfective gastroenteritis and colitis, unspecified: Secondary | ICD-10-CM | POA: Diagnosis not present

## 2020-02-13 DIAGNOSIS — M5416 Radiculopathy, lumbar region: Secondary | ICD-10-CM | POA: Diagnosis not present

## 2020-02-13 DIAGNOSIS — D649 Anemia, unspecified: Secondary | ICD-10-CM | POA: Diagnosis not present

## 2020-02-13 DIAGNOSIS — G4700 Insomnia, unspecified: Secondary | ICD-10-CM | POA: Diagnosis not present

## 2020-02-13 DIAGNOSIS — I10 Essential (primary) hypertension: Secondary | ICD-10-CM | POA: Diagnosis not present

## 2020-02-13 DIAGNOSIS — G250 Essential tremor: Secondary | ICD-10-CM | POA: Diagnosis not present

## 2020-02-13 DIAGNOSIS — E119 Type 2 diabetes mellitus without complications: Secondary | ICD-10-CM | POA: Diagnosis not present

## 2020-02-13 DIAGNOSIS — E782 Mixed hyperlipidemia: Secondary | ICD-10-CM | POA: Diagnosis not present

## 2020-02-14 DIAGNOSIS — E782 Mixed hyperlipidemia: Secondary | ICD-10-CM | POA: Diagnosis not present

## 2020-02-14 DIAGNOSIS — G250 Essential tremor: Secondary | ICD-10-CM | POA: Diagnosis not present

## 2020-02-14 DIAGNOSIS — D649 Anemia, unspecified: Secondary | ICD-10-CM | POA: Diagnosis not present

## 2020-02-14 DIAGNOSIS — I69354 Hemiplegia and hemiparesis following cerebral infarction affecting left non-dominant side: Secondary | ICD-10-CM | POA: Diagnosis not present

## 2020-02-14 DIAGNOSIS — G8929 Other chronic pain: Secondary | ICD-10-CM | POA: Diagnosis not present

## 2020-02-14 DIAGNOSIS — G4700 Insomnia, unspecified: Secondary | ICD-10-CM | POA: Diagnosis not present

## 2020-02-14 DIAGNOSIS — E119 Type 2 diabetes mellitus without complications: Secondary | ICD-10-CM | POA: Diagnosis not present

## 2020-02-14 DIAGNOSIS — K529 Noninfective gastroenteritis and colitis, unspecified: Secondary | ICD-10-CM | POA: Diagnosis not present

## 2020-02-14 DIAGNOSIS — M5416 Radiculopathy, lumbar region: Secondary | ICD-10-CM | POA: Diagnosis not present

## 2020-02-14 DIAGNOSIS — M25511 Pain in right shoulder: Secondary | ICD-10-CM | POA: Diagnosis not present

## 2020-02-14 DIAGNOSIS — I10 Essential (primary) hypertension: Secondary | ICD-10-CM | POA: Diagnosis not present

## 2020-02-15 DIAGNOSIS — K529 Noninfective gastroenteritis and colitis, unspecified: Secondary | ICD-10-CM | POA: Diagnosis not present

## 2020-02-15 DIAGNOSIS — I10 Essential (primary) hypertension: Secondary | ICD-10-CM | POA: Diagnosis not present

## 2020-02-15 DIAGNOSIS — G250 Essential tremor: Secondary | ICD-10-CM | POA: Diagnosis not present

## 2020-02-15 DIAGNOSIS — D649 Anemia, unspecified: Secondary | ICD-10-CM | POA: Diagnosis not present

## 2020-02-15 DIAGNOSIS — E119 Type 2 diabetes mellitus without complications: Secondary | ICD-10-CM | POA: Diagnosis not present

## 2020-02-15 DIAGNOSIS — E782 Mixed hyperlipidemia: Secondary | ICD-10-CM | POA: Diagnosis not present

## 2020-02-15 DIAGNOSIS — M25511 Pain in right shoulder: Secondary | ICD-10-CM | POA: Diagnosis not present

## 2020-02-15 DIAGNOSIS — G4700 Insomnia, unspecified: Secondary | ICD-10-CM | POA: Diagnosis not present

## 2020-02-15 DIAGNOSIS — M5416 Radiculopathy, lumbar region: Secondary | ICD-10-CM | POA: Diagnosis not present

## 2020-02-15 DIAGNOSIS — I69354 Hemiplegia and hemiparesis following cerebral infarction affecting left non-dominant side: Secondary | ICD-10-CM | POA: Diagnosis not present

## 2020-02-15 DIAGNOSIS — G8929 Other chronic pain: Secondary | ICD-10-CM | POA: Diagnosis not present

## 2020-02-16 DIAGNOSIS — G4700 Insomnia, unspecified: Secondary | ICD-10-CM | POA: Diagnosis not present

## 2020-02-16 DIAGNOSIS — I69354 Hemiplegia and hemiparesis following cerebral infarction affecting left non-dominant side: Secondary | ICD-10-CM | POA: Diagnosis not present

## 2020-02-16 DIAGNOSIS — M5416 Radiculopathy, lumbar region: Secondary | ICD-10-CM | POA: Diagnosis not present

## 2020-02-16 DIAGNOSIS — E782 Mixed hyperlipidemia: Secondary | ICD-10-CM | POA: Diagnosis not present

## 2020-02-16 DIAGNOSIS — M25511 Pain in right shoulder: Secondary | ICD-10-CM | POA: Diagnosis not present

## 2020-02-16 DIAGNOSIS — G250 Essential tremor: Secondary | ICD-10-CM | POA: Diagnosis not present

## 2020-02-16 DIAGNOSIS — I10 Essential (primary) hypertension: Secondary | ICD-10-CM | POA: Diagnosis not present

## 2020-02-16 DIAGNOSIS — D649 Anemia, unspecified: Secondary | ICD-10-CM | POA: Diagnosis not present

## 2020-02-16 DIAGNOSIS — K529 Noninfective gastroenteritis and colitis, unspecified: Secondary | ICD-10-CM | POA: Diagnosis not present

## 2020-02-16 DIAGNOSIS — G8929 Other chronic pain: Secondary | ICD-10-CM | POA: Diagnosis not present

## 2020-02-16 DIAGNOSIS — E119 Type 2 diabetes mellitus without complications: Secondary | ICD-10-CM | POA: Diagnosis not present

## 2020-02-17 DIAGNOSIS — G250 Essential tremor: Secondary | ICD-10-CM | POA: Diagnosis not present

## 2020-02-17 DIAGNOSIS — I10 Essential (primary) hypertension: Secondary | ICD-10-CM | POA: Diagnosis not present

## 2020-02-17 DIAGNOSIS — G4700 Insomnia, unspecified: Secondary | ICD-10-CM | POA: Diagnosis not present

## 2020-02-17 DIAGNOSIS — K529 Noninfective gastroenteritis and colitis, unspecified: Secondary | ICD-10-CM | POA: Diagnosis not present

## 2020-02-17 DIAGNOSIS — E782 Mixed hyperlipidemia: Secondary | ICD-10-CM | POA: Diagnosis not present

## 2020-02-17 DIAGNOSIS — I69354 Hemiplegia and hemiparesis following cerebral infarction affecting left non-dominant side: Secondary | ICD-10-CM | POA: Diagnosis not present

## 2020-02-17 DIAGNOSIS — M5416 Radiculopathy, lumbar region: Secondary | ICD-10-CM | POA: Diagnosis not present

## 2020-02-17 DIAGNOSIS — M25511 Pain in right shoulder: Secondary | ICD-10-CM | POA: Diagnosis not present

## 2020-02-17 DIAGNOSIS — D649 Anemia, unspecified: Secondary | ICD-10-CM | POA: Diagnosis not present

## 2020-02-17 DIAGNOSIS — E119 Type 2 diabetes mellitus without complications: Secondary | ICD-10-CM | POA: Diagnosis not present

## 2020-02-17 DIAGNOSIS — G8929 Other chronic pain: Secondary | ICD-10-CM | POA: Diagnosis not present

## 2020-02-20 DIAGNOSIS — G4700 Insomnia, unspecified: Secondary | ICD-10-CM | POA: Diagnosis not present

## 2020-02-20 DIAGNOSIS — E782 Mixed hyperlipidemia: Secondary | ICD-10-CM | POA: Diagnosis not present

## 2020-02-20 DIAGNOSIS — E119 Type 2 diabetes mellitus without complications: Secondary | ICD-10-CM | POA: Diagnosis not present

## 2020-02-20 DIAGNOSIS — G8929 Other chronic pain: Secondary | ICD-10-CM | POA: Diagnosis not present

## 2020-02-20 DIAGNOSIS — G250 Essential tremor: Secondary | ICD-10-CM | POA: Diagnosis not present

## 2020-02-20 DIAGNOSIS — D649 Anemia, unspecified: Secondary | ICD-10-CM | POA: Diagnosis not present

## 2020-02-20 DIAGNOSIS — M25511 Pain in right shoulder: Secondary | ICD-10-CM | POA: Diagnosis not present

## 2020-02-20 DIAGNOSIS — I10 Essential (primary) hypertension: Secondary | ICD-10-CM | POA: Diagnosis not present

## 2020-02-20 DIAGNOSIS — I69354 Hemiplegia and hemiparesis following cerebral infarction affecting left non-dominant side: Secondary | ICD-10-CM | POA: Diagnosis not present

## 2020-02-20 DIAGNOSIS — M5416 Radiculopathy, lumbar region: Secondary | ICD-10-CM | POA: Diagnosis not present

## 2020-02-20 DIAGNOSIS — K529 Noninfective gastroenteritis and colitis, unspecified: Secondary | ICD-10-CM | POA: Diagnosis not present

## 2020-02-21 ENCOUNTER — Other Ambulatory Visit: Payer: Self-pay | Admitting: *Deleted

## 2020-02-21 DIAGNOSIS — M5416 Radiculopathy, lumbar region: Secondary | ICD-10-CM | POA: Diagnosis not present

## 2020-02-21 DIAGNOSIS — G8929 Other chronic pain: Secondary | ICD-10-CM | POA: Diagnosis not present

## 2020-02-21 DIAGNOSIS — M25511 Pain in right shoulder: Secondary | ICD-10-CM | POA: Diagnosis not present

## 2020-02-21 DIAGNOSIS — I10 Essential (primary) hypertension: Secondary | ICD-10-CM | POA: Diagnosis not present

## 2020-02-21 DIAGNOSIS — G250 Essential tremor: Secondary | ICD-10-CM | POA: Diagnosis not present

## 2020-02-21 DIAGNOSIS — E782 Mixed hyperlipidemia: Secondary | ICD-10-CM | POA: Diagnosis not present

## 2020-02-21 DIAGNOSIS — D649 Anemia, unspecified: Secondary | ICD-10-CM | POA: Diagnosis not present

## 2020-02-21 DIAGNOSIS — I69354 Hemiplegia and hemiparesis following cerebral infarction affecting left non-dominant side: Secondary | ICD-10-CM | POA: Diagnosis not present

## 2020-02-21 DIAGNOSIS — E119 Type 2 diabetes mellitus without complications: Secondary | ICD-10-CM | POA: Diagnosis not present

## 2020-02-21 DIAGNOSIS — K529 Noninfective gastroenteritis and colitis, unspecified: Secondary | ICD-10-CM | POA: Diagnosis not present

## 2020-02-21 DIAGNOSIS — G4700 Insomnia, unspecified: Secondary | ICD-10-CM | POA: Diagnosis not present

## 2020-02-21 NOTE — Patient Outreach (Signed)
[facility]) Care Management  02/21/2020  Beth Young [birth year] [id number]  Telephone outreach Unsuccessful, unable to leave a message mailbox full. Called her sister, advised trying to reach her sister and to please advise to call me.  Pt called me right away. She reports she has been doing OK. She reports she has an upset stomach. She had had 3 diarrhea stools this am. She reminds me she has IBS.  She is currently getting PT and her therapist is with her. Requested pt finish her therapy and call me back.  Pt return my call.  Advised arranged pt to be able to get a one time topical CBD salve to try at pharmacy cost of $15.00.  Also gave her the name of a practice and provider in [place] that may be able to help her ([facility], Beth Lenz, NP/Anesthesiologist).  Assessment: Intractable back pain  Goals Addressed            This Visit's Progress   . Cope with Chronic Pain       Follow Up Date 03/16/20  - learn relaxation techniques - practice acceptance of chronic pain - practice relaxation or meditation daily - tell myself I can (not I can't) - use distraction techniques - use relaxation during pain    Why is this important?    Stress makes chronic pain feel worse.   Feelings like depression, anxiety, stress and anger can make your body more sensitive to pain.   Learning ways to cope with stress or depression may help you find some relief from the pain.     Notes: Pt does rest but this needs to be developed into an active or mindful practice. 02/21/20 Pt reports sitting in recliner, TV off, closes her eyes and reminesces about good times with her father which are very pleasurable. She prays daily. She is positive and NEVER says she can't do something. Her pain level at best is 7/10 at present 8/10, upon awakening this am it was 7/10. She takes her APAP which knocks her pain level down 1 point at best.    . Manage Pain       Follow  Up Date  03/16/20 - develop a personal pain management plan - prioritize tasks for the day - start a pain diary - track times pain is worst and when it is best - track what makes the pain worse and what makes it better    Why is this important?    Day-to-day life can be hard when you have chronic pain.   Pain medicine is just one piece of the treatment puzzle.   You can try these action steps to help you manage your pain.    Notes: Initial assessment of pain done today and alternative therapies discussed. 02/21/20 Developed plan together today. Pt to try topical CBD provided at cost at [facility]. Also she is to call [facility] for an intake to see if their provider Beth Lenz, NP, Anesthesiologist may be able to help her.      We agreed to talk again the first week in January.  Beth Young. Beth Estelle, MSN, [facility] Gerontological Nurse Practitioner [facility] Care Management [phone]

## 2020-02-22 DIAGNOSIS — M25511 Pain in right shoulder: Secondary | ICD-10-CM | POA: Diagnosis not present

## 2020-02-22 DIAGNOSIS — E782 Mixed hyperlipidemia: Secondary | ICD-10-CM | POA: Diagnosis not present

## 2020-02-22 DIAGNOSIS — I10 Essential (primary) hypertension: Secondary | ICD-10-CM | POA: Diagnosis not present

## 2020-02-22 DIAGNOSIS — G8929 Other chronic pain: Secondary | ICD-10-CM | POA: Diagnosis not present

## 2020-02-22 DIAGNOSIS — M5416 Radiculopathy, lumbar region: Secondary | ICD-10-CM | POA: Diagnosis not present

## 2020-02-22 DIAGNOSIS — G250 Essential tremor: Secondary | ICD-10-CM | POA: Diagnosis not present

## 2020-02-22 DIAGNOSIS — K529 Noninfective gastroenteritis and colitis, unspecified: Secondary | ICD-10-CM | POA: Diagnosis not present

## 2020-02-22 DIAGNOSIS — I69354 Hemiplegia and hemiparesis following cerebral infarction affecting left non-dominant side: Secondary | ICD-10-CM | POA: Diagnosis not present

## 2020-02-22 DIAGNOSIS — G4700 Insomnia, unspecified: Secondary | ICD-10-CM | POA: Diagnosis not present

## 2020-02-22 DIAGNOSIS — E119 Type 2 diabetes mellitus without complications: Secondary | ICD-10-CM | POA: Diagnosis not present

## 2020-02-22 DIAGNOSIS — D649 Anemia, unspecified: Secondary | ICD-10-CM | POA: Diagnosis not present

## 2020-02-23 DIAGNOSIS — G8929 Other chronic pain: Secondary | ICD-10-CM | POA: Diagnosis not present

## 2020-02-23 DIAGNOSIS — I10 Essential (primary) hypertension: Secondary | ICD-10-CM | POA: Diagnosis not present

## 2020-02-23 DIAGNOSIS — M5416 Radiculopathy, lumbar region: Secondary | ICD-10-CM | POA: Diagnosis not present

## 2020-02-23 DIAGNOSIS — D649 Anemia, unspecified: Secondary | ICD-10-CM | POA: Diagnosis not present

## 2020-02-23 DIAGNOSIS — E119 Type 2 diabetes mellitus without complications: Secondary | ICD-10-CM | POA: Diagnosis not present

## 2020-02-23 DIAGNOSIS — I69354 Hemiplegia and hemiparesis following cerebral infarction affecting left non-dominant side: Secondary | ICD-10-CM | POA: Diagnosis not present

## 2020-02-23 DIAGNOSIS — G250 Essential tremor: Secondary | ICD-10-CM | POA: Diagnosis not present

## 2020-02-23 DIAGNOSIS — E782 Mixed hyperlipidemia: Secondary | ICD-10-CM | POA: Diagnosis not present

## 2020-02-23 DIAGNOSIS — K529 Noninfective gastroenteritis and colitis, unspecified: Secondary | ICD-10-CM | POA: Diagnosis not present

## 2020-02-23 DIAGNOSIS — M25511 Pain in right shoulder: Secondary | ICD-10-CM | POA: Diagnosis not present

## 2020-02-23 DIAGNOSIS — G4700 Insomnia, unspecified: Secondary | ICD-10-CM | POA: Diagnosis not present

## 2020-02-27 DIAGNOSIS — E782 Mixed hyperlipidemia: Secondary | ICD-10-CM | POA: Diagnosis not present

## 2020-02-27 DIAGNOSIS — G4700 Insomnia, unspecified: Secondary | ICD-10-CM | POA: Diagnosis not present

## 2020-02-27 DIAGNOSIS — M5416 Radiculopathy, lumbar region: Secondary | ICD-10-CM | POA: Diagnosis not present

## 2020-02-27 DIAGNOSIS — I10 Essential (primary) hypertension: Secondary | ICD-10-CM | POA: Diagnosis not present

## 2020-02-27 DIAGNOSIS — K529 Noninfective gastroenteritis and colitis, unspecified: Secondary | ICD-10-CM | POA: Diagnosis not present

## 2020-02-27 DIAGNOSIS — D649 Anemia, unspecified: Secondary | ICD-10-CM | POA: Diagnosis not present

## 2020-02-27 DIAGNOSIS — E119 Type 2 diabetes mellitus without complications: Secondary | ICD-10-CM | POA: Diagnosis not present

## 2020-02-27 DIAGNOSIS — G250 Essential tremor: Secondary | ICD-10-CM | POA: Diagnosis not present

## 2020-02-27 DIAGNOSIS — I69354 Hemiplegia and hemiparesis following cerebral infarction affecting left non-dominant side: Secondary | ICD-10-CM | POA: Diagnosis not present

## 2020-02-27 DIAGNOSIS — G8929 Other chronic pain: Secondary | ICD-10-CM | POA: Diagnosis not present

## 2020-02-27 DIAGNOSIS — M25511 Pain in right shoulder: Secondary | ICD-10-CM | POA: Diagnosis not present

## 2020-02-29 DIAGNOSIS — G8929 Other chronic pain: Secondary | ICD-10-CM | POA: Diagnosis not present

## 2020-02-29 DIAGNOSIS — M5416 Radiculopathy, lumbar region: Secondary | ICD-10-CM | POA: Diagnosis not present

## 2020-02-29 DIAGNOSIS — I69354 Hemiplegia and hemiparesis following cerebral infarction affecting left non-dominant side: Secondary | ICD-10-CM | POA: Diagnosis not present

## 2020-02-29 DIAGNOSIS — G250 Essential tremor: Secondary | ICD-10-CM | POA: Diagnosis not present

## 2020-02-29 DIAGNOSIS — M25511 Pain in right shoulder: Secondary | ICD-10-CM | POA: Diagnosis not present

## 2020-02-29 DIAGNOSIS — D649 Anemia, unspecified: Secondary | ICD-10-CM | POA: Diagnosis not present

## 2020-02-29 DIAGNOSIS — E119 Type 2 diabetes mellitus without complications: Secondary | ICD-10-CM | POA: Diagnosis not present

## 2020-02-29 DIAGNOSIS — E782 Mixed hyperlipidemia: Secondary | ICD-10-CM | POA: Diagnosis not present

## 2020-02-29 DIAGNOSIS — I10 Essential (primary) hypertension: Secondary | ICD-10-CM | POA: Diagnosis not present

## 2020-02-29 DIAGNOSIS — G4700 Insomnia, unspecified: Secondary | ICD-10-CM | POA: Diagnosis not present

## 2020-02-29 DIAGNOSIS — K529 Noninfective gastroenteritis and colitis, unspecified: Secondary | ICD-10-CM | POA: Diagnosis not present

## 2020-03-08 DIAGNOSIS — K644 Residual hemorrhoidal skin tags: Secondary | ICD-10-CM | POA: Diagnosis not present

## 2020-03-09 DIAGNOSIS — E7849 Other hyperlipidemia: Secondary | ICD-10-CM | POA: Diagnosis not present

## 2020-03-09 DIAGNOSIS — E119 Type 2 diabetes mellitus without complications: Secondary | ICD-10-CM | POA: Diagnosis not present

## 2020-03-09 DIAGNOSIS — I10 Essential (primary) hypertension: Secondary | ICD-10-CM | POA: Diagnosis not present

## 2020-03-09 DIAGNOSIS — J441 Chronic obstructive pulmonary disease with (acute) exacerbation: Secondary | ICD-10-CM | POA: Diagnosis not present

## 2020-03-14 ENCOUNTER — Other Ambulatory Visit: Payer: Self-pay | Admitting: *Deleted

## 2020-03-14 NOTE — Patient Outreach (Signed)
[facility] Customer service manager [facility]) Care Management  03/14/2020  Beth Young 09/03/1945 [id number]  Telephone outreach to follow up on pain management.  Goals Addressed            This Visit's Progress   . Cope with Chronic Pain       Follow Up Date 04/11/20  - learn relaxation techniques - practice acceptance of chronic pain - practice relaxation or meditation daily - tell myself I can (not I can't) - use distraction techniques - use relaxation during pain    Why is this important?    Stress makes chronic pain feel worse.   Feelings like depression, anxiety, stress and anger can make your body more sensitive to pain.   Learning ways to cope with stress or depression may help you find some relief from the pain.     Notes: Pt does rest but this needs to be developed into an active or mindful   is positive and NEVER says she can't do something. Her pain level at best is 7/10 at present 8/10, upon awakening this am it was 7/10. She takes her APAP which knocks her pain level down 1 point at best. 03/14/20 Beth Young reports she did get the CBD salve and she does use it every day and it does help a little bit. She says she can be at a 10/10 pain level and she can put that on and her pain may go to a 7/10. Her pain never gets better than that. She is happy with the trial of the product and will continue to use it. She also has a new hospital bed which is adjustable and she can get more comfortable in it than her old bed. Encouraged her relaxation and faith practices. Advised to call the resource I gave her last time we talked, [facility] in [place], and discuss her problem with them and see if they feel they can help. She complains that [facility] is a resource she has used for transportation but that waiting in an office for any length of time for a pick up is not beneficial for her. Reminded her that [facility] does have a transportation benefit and that she should access this.    . Manage Pain        Follow Up Date  04/11/20 - develop a personal pain management plan - prioritize tasks for the day - start a pain diary - track times pain is worst and when it is best - track what makes the pain worse and what makes it better    Why is this important?    Day-to-day life can be hard when you have chronic pain.   Pain medicine is just one piece of the treatment puzzle.   You can try these action steps to help you manage your pain.    Notes: Initial assessment of pain done today and alternative therapies discussed. 02/21/20 Developed plan together today. Pt to try topical CBD provided at cost at [facility]. Also she is to call [facility] for an intake to see if their provider Beth Lenz, NP, Anesthesiologist may be able to help her. 03/14/20 Pt has been using the CBD salve with some success. She did not call the resoiurce provided because of transportation. Reminded her Beth Young provides transportation for office visits. Advised her assignment is to call and if they feel they can help her to make and appt. Call Beth Young to arrange for the transportation.      We  agreed to talk again in one month.  Beth Young. Beth Estelle, MSN, [facility] Gerontological Nurse Practitioner [facility] Care Management [phone]

## 2020-03-27 DIAGNOSIS — M6281 Muscle weakness (generalized): Secondary | ICD-10-CM | POA: Diagnosis not present

## 2020-03-27 DIAGNOSIS — M545 Low back pain, unspecified: Secondary | ICD-10-CM | POA: Diagnosis not present

## 2020-03-28 DIAGNOSIS — E7849 Other hyperlipidemia: Secondary | ICD-10-CM | POA: Diagnosis not present

## 2020-03-28 DIAGNOSIS — K529 Noninfective gastroenteritis and colitis, unspecified: Secondary | ICD-10-CM | POA: Diagnosis not present

## 2020-03-28 DIAGNOSIS — G20 Parkinson's disease: Secondary | ICD-10-CM | POA: Diagnosis not present

## 2020-03-28 DIAGNOSIS — I10 Essential (primary) hypertension: Secondary | ICD-10-CM | POA: Diagnosis not present

## 2020-03-28 DIAGNOSIS — D649 Anemia, unspecified: Secondary | ICD-10-CM | POA: Diagnosis not present

## 2020-03-28 DIAGNOSIS — G250 Essential tremor: Secondary | ICD-10-CM | POA: Diagnosis not present

## 2020-04-07 DIAGNOSIS — E7849 Other hyperlipidemia: Secondary | ICD-10-CM | POA: Diagnosis not present

## 2020-04-07 DIAGNOSIS — E119 Type 2 diabetes mellitus without complications: Secondary | ICD-10-CM | POA: Diagnosis not present

## 2020-04-07 DIAGNOSIS — I10 Essential (primary) hypertension: Secondary | ICD-10-CM | POA: Diagnosis not present

## 2020-04-07 DIAGNOSIS — J441 Chronic obstructive pulmonary disease with (acute) exacerbation: Secondary | ICD-10-CM | POA: Diagnosis not present

## 2020-04-07 DIAGNOSIS — Z87891 Personal history of nicotine dependence: Secondary | ICD-10-CM | POA: Diagnosis not present

## 2020-04-11 ENCOUNTER — Other Ambulatory Visit: Payer: Self-pay | Admitting: *Deleted

## 2020-04-11 NOTE — Patient Outreach (Signed)
[facility]) Care Management  04/11/2020  Beth Young 1945-11-19 [id number]  Telephone outreach for pain management follow up.  Goals Addressed            This Visit's Progress   . COMPLETED: Cope with Chronic Pain   On track    Follow Up Date 04/11/20  - learn relaxation techniques - practice acceptance of chronic pain - practice relaxation or meditation daily - tell myself I can (not I can't) - use distraction techniques - use relaxation during pain    Why is this important?    Stress makes chronic pain feel worse.   Feelings like depression, anxiety, stress and anger can make your body more sensitive to pain.   Learning ways to cope with stress or depression may help you find some relief from the pain.     Notes: Pt does rest but this needs to be developed into an active or mindful   is positive and NEVER says she can't do something. Her pain level at best is 7/10 at present 8/10, upon awakening this am it was 7/10. She takes her APAP which knocks her pain level down 1 point at best. 03/14/20 Beth Young reports she did get the CBD salve and she does use it every day and it does help a little bit. She says she can be at a 10/10 pain level and she can put that on and her pain may go to a 7/10. Her pain never gets better than that. She is happy with the trial of the product and will continue to use it. She also has a new hospital bed which is adjustable and she can get more comfortable in it than her old bed. Encouraged her relaxation and faith practices. Advised to call the resource I gave her last time we talked, [facility] in [place], and discuss her problem with them and see if they feel they can help. She complains that [employer] is a resource she has used for transportation but that waiting in an office for any length of time for a pick up is not beneficial for her. Reminded her that [facility] does have a transportation benefit and that she should access this  04/11/20 Discussion today reveals that pt has achieved this goal. She voices that she is satisfied with the progress she has made over the last several months and is coping better. She continues to use APAP, rest, prayer and the CBD ointment for pain relief.    . COMPLETED: Manage Pain       Follow Up Date  04/11/20 - develop a personal pain management plan - prioritize tasks for the day - start a pain diary - track times pain is worst and when it is best - track what makes the pain worse and what makes it better    Why is this important?    Day-to-day life can be hard when you have chronic pain.   Pain medicine is just one piece of the treatment puzzle.   You can try these action steps to help you manage your pain.    Notes: Initial assessment of pain done today and alternative therapies discussed. 02/21/20 Developed plan together today. Pt to try topical CBD provided at cost at [place] store. Also she is to call [facility] for an intake to see if their provider Chilton Greathouse, NP, Anesthesiologist may be able to help her. 03/14/20 Pt has been using the CBD salve with some success. She did  not call the resoiurce provided because of transportation. Reminded her [facility] provides transportation for office visits. Advised her assignment is to call and if they feel they can help her to make and appt. Call [facility] to arrange for the transportation. 04/11/20 Pt has continued the pain management plan that we have worked on together, although she decided to not tap into the resource of [facility]. She is satisfied that her pain is managed as best as it can be without invasive procedures.      These goals have been met. No other care management needs. Diabetes control is excellent. NP will close her case today. She has been advised she can always call me in the future if she has any new problems or just needs moral support.  Beth Young. Myrtie Neither, MSN, [facility] Nurse  Practitioner [facility] Care Management [phone]

## 2022-05-27 DIAGNOSIS — E1122 Type 2 diabetes mellitus with diabetic chronic kidney disease: Secondary | ICD-10-CM | POA: Diagnosis not present

## 2022-05-27 DIAGNOSIS — I10 Essential (primary) hypertension: Secondary | ICD-10-CM | POA: Diagnosis not present

## 2022-05-27 DIAGNOSIS — K219 Gastro-esophageal reflux disease without esophagitis: Secondary | ICD-10-CM | POA: Diagnosis not present

## 2022-05-27 DIAGNOSIS — E039 Hypothyroidism, unspecified: Secondary | ICD-10-CM | POA: Diagnosis not present

## 2022-05-27 DIAGNOSIS — E782 Mixed hyperlipidemia: Secondary | ICD-10-CM | POA: Diagnosis not present

## 2022-05-27 DIAGNOSIS — E7849 Other hyperlipidemia: Secondary | ICD-10-CM | POA: Diagnosis not present

## 2022-05-29 DIAGNOSIS — I739 Peripheral vascular disease, unspecified: Secondary | ICD-10-CM | POA: Diagnosis not present

## 2022-05-29 DIAGNOSIS — M79674 Pain in right toe(s): Secondary | ICD-10-CM | POA: Diagnosis not present

## 2022-05-29 DIAGNOSIS — M79675 Pain in left toe(s): Secondary | ICD-10-CM | POA: Diagnosis not present

## 2022-05-29 DIAGNOSIS — M79671 Pain in right foot: Secondary | ICD-10-CM | POA: Diagnosis not present

## 2022-05-29 DIAGNOSIS — M79672 Pain in left foot: Secondary | ICD-10-CM | POA: Diagnosis not present

## 2022-05-29 DIAGNOSIS — E1140 Type 2 diabetes mellitus with diabetic neuropathy, unspecified: Secondary | ICD-10-CM | POA: Diagnosis not present

## 2022-06-03 DIAGNOSIS — E1122 Type 2 diabetes mellitus with diabetic chronic kidney disease: Secondary | ICD-10-CM | POA: Diagnosis not present

## 2022-06-03 DIAGNOSIS — J449 Chronic obstructive pulmonary disease, unspecified: Secondary | ICD-10-CM | POA: Diagnosis not present

## 2022-06-03 DIAGNOSIS — K529 Noninfective gastroenteritis and colitis, unspecified: Secondary | ICD-10-CM | POA: Diagnosis not present

## 2022-06-03 DIAGNOSIS — E7849 Other hyperlipidemia: Secondary | ICD-10-CM | POA: Diagnosis not present

## 2022-06-03 DIAGNOSIS — Z9181 History of falling: Secondary | ICD-10-CM | POA: Diagnosis not present

## 2022-06-03 DIAGNOSIS — G20A1 Parkinson's disease without dyskinesia, without mention of fluctuations: Secondary | ICD-10-CM | POA: Diagnosis not present

## 2022-06-03 DIAGNOSIS — I10 Essential (primary) hypertension: Secondary | ICD-10-CM | POA: Diagnosis not present

## 2022-06-03 DIAGNOSIS — G8194 Hemiplegia, unspecified affecting left nondominant side: Secondary | ICD-10-CM | POA: Diagnosis not present

## 2022-06-03 DIAGNOSIS — M5441 Lumbago with sciatica, right side: Secondary | ICD-10-CM | POA: Diagnosis not present

## 2022-06-03 DIAGNOSIS — K589 Irritable bowel syndrome without diarrhea: Secondary | ICD-10-CM | POA: Diagnosis not present

## 2022-06-03 DIAGNOSIS — M5416 Radiculopathy, lumbar region: Secondary | ICD-10-CM | POA: Diagnosis not present

## 2022-08-13 DIAGNOSIS — Z961 Presence of intraocular lens: Secondary | ICD-10-CM | POA: Diagnosis not present

## 2022-08-13 DIAGNOSIS — Z7984 Long term (current) use of oral hypoglycemic drugs: Secondary | ICD-10-CM | POA: Diagnosis not present

## 2022-08-13 DIAGNOSIS — E119 Type 2 diabetes mellitus without complications: Secondary | ICD-10-CM | POA: Diagnosis not present

## 2022-08-28 DIAGNOSIS — M79672 Pain in left foot: Secondary | ICD-10-CM | POA: Diagnosis not present

## 2022-08-28 DIAGNOSIS — M79671 Pain in right foot: Secondary | ICD-10-CM | POA: Diagnosis not present

## 2022-08-28 DIAGNOSIS — I739 Peripheral vascular disease, unspecified: Secondary | ICD-10-CM | POA: Diagnosis not present

## 2022-08-28 DIAGNOSIS — M79675 Pain in left toe(s): Secondary | ICD-10-CM | POA: Diagnosis not present

## 2022-08-28 DIAGNOSIS — M79674 Pain in right toe(s): Secondary | ICD-10-CM | POA: Diagnosis not present

## 2022-08-28 DIAGNOSIS — E1140 Type 2 diabetes mellitus with diabetic neuropathy, unspecified: Secondary | ICD-10-CM | POA: Diagnosis not present

## 2022-10-29 DIAGNOSIS — E559 Vitamin D deficiency, unspecified: Secondary | ICD-10-CM | POA: Diagnosis not present

## 2022-10-29 DIAGNOSIS — Z0001 Encounter for general adult medical examination with abnormal findings: Secondary | ICD-10-CM | POA: Diagnosis not present

## 2022-10-29 DIAGNOSIS — K219 Gastro-esophageal reflux disease without esophagitis: Secondary | ICD-10-CM | POA: Diagnosis not present

## 2022-10-29 DIAGNOSIS — E1122 Type 2 diabetes mellitus with diabetic chronic kidney disease: Secondary | ICD-10-CM | POA: Diagnosis not present

## 2022-10-29 DIAGNOSIS — N1832 Chronic kidney disease, stage 3b: Secondary | ICD-10-CM | POA: Diagnosis not present

## 2022-10-29 DIAGNOSIS — E7849 Other hyperlipidemia: Secondary | ICD-10-CM | POA: Diagnosis not present

## 2022-10-29 DIAGNOSIS — I10 Essential (primary) hypertension: Secondary | ICD-10-CM | POA: Diagnosis not present

## 2022-10-29 DIAGNOSIS — E039 Hypothyroidism, unspecified: Secondary | ICD-10-CM | POA: Diagnosis not present

## 2022-10-29 DIAGNOSIS — G20A1 Parkinson's disease without dyskinesia, without mention of fluctuations: Secondary | ICD-10-CM | POA: Diagnosis not present

## 2022-10-29 DIAGNOSIS — J449 Chronic obstructive pulmonary disease, unspecified: Secondary | ICD-10-CM | POA: Diagnosis not present

## 2022-11-05 DIAGNOSIS — I10 Essential (primary) hypertension: Secondary | ICD-10-CM | POA: Diagnosis not present

## 2022-11-05 DIAGNOSIS — G20A1 Parkinson's disease without dyskinesia, without mention of fluctuations: Secondary | ICD-10-CM | POA: Diagnosis not present

## 2022-11-05 DIAGNOSIS — M5441 Lumbago with sciatica, right side: Secondary | ICD-10-CM | POA: Diagnosis not present

## 2022-11-05 DIAGNOSIS — N1832 Chronic kidney disease, stage 3b: Secondary | ICD-10-CM | POA: Diagnosis not present

## 2022-11-05 DIAGNOSIS — Z0001 Encounter for general adult medical examination with abnormal findings: Secondary | ICD-10-CM | POA: Diagnosis not present

## 2022-11-05 DIAGNOSIS — E1122 Type 2 diabetes mellitus with diabetic chronic kidney disease: Secondary | ICD-10-CM | POA: Diagnosis not present

## 2022-11-05 DIAGNOSIS — G8194 Hemiplegia, unspecified affecting left nondominant side: Secondary | ICD-10-CM | POA: Diagnosis not present

## 2022-11-05 DIAGNOSIS — Z9181 History of falling: Secondary | ICD-10-CM | POA: Diagnosis not present

## 2022-11-05 DIAGNOSIS — M5416 Radiculopathy, lumbar region: Secondary | ICD-10-CM | POA: Diagnosis not present

## 2022-11-05 DIAGNOSIS — Z6824 Body mass index (BMI) 24.0-24.9, adult: Secondary | ICD-10-CM | POA: Diagnosis not present

## 2022-11-05 DIAGNOSIS — Z23 Encounter for immunization: Secondary | ICD-10-CM | POA: Diagnosis not present

## 2022-11-27 DIAGNOSIS — M79675 Pain in left toe(s): Secondary | ICD-10-CM | POA: Diagnosis not present

## 2022-11-27 DIAGNOSIS — I739 Peripheral vascular disease, unspecified: Secondary | ICD-10-CM | POA: Diagnosis not present

## 2022-11-27 DIAGNOSIS — M79672 Pain in left foot: Secondary | ICD-10-CM | POA: Diagnosis not present

## 2022-11-27 DIAGNOSIS — M79674 Pain in right toe(s): Secondary | ICD-10-CM | POA: Diagnosis not present

## 2022-11-27 DIAGNOSIS — M79671 Pain in right foot: Secondary | ICD-10-CM | POA: Diagnosis not present

## 2022-11-27 DIAGNOSIS — E1140 Type 2 diabetes mellitus with diabetic neuropathy, unspecified: Secondary | ICD-10-CM | POA: Diagnosis not present

## 2022-12-28 ENCOUNTER — Emergency Department (HOSPITAL_COMMUNITY): Payer: 59

## 2022-12-28 ENCOUNTER — Encounter (HOSPITAL_COMMUNITY): Payer: Self-pay

## 2022-12-28 ENCOUNTER — Other Ambulatory Visit: Payer: Self-pay

## 2022-12-28 ENCOUNTER — Inpatient Hospital Stay (HOSPITAL_COMMUNITY)
Admission: EM | Admit: 2022-12-28 | Discharge: 2022-12-31 | DRG: 522 | Disposition: A | Discharge status: Skilled Nursing Facility | Payer: 59 | Attending: Internal Medicine | Admitting: Internal Medicine

## 2022-12-28 DIAGNOSIS — R61 Generalized hyperhidrosis: Secondary | ICD-10-CM | POA: Diagnosis not present

## 2022-12-28 DIAGNOSIS — M47816 Spondylosis without myelopathy or radiculopathy, lumbar region: Secondary | ICD-10-CM | POA: Diagnosis not present

## 2022-12-28 DIAGNOSIS — W1830XA Fall on same level, unspecified, initial encounter: Secondary | ICD-10-CM | POA: Diagnosis present

## 2022-12-28 DIAGNOSIS — I129 Hypertensive chronic kidney disease with stage 1 through stage 4 chronic kidney disease, or unspecified chronic kidney disease: Secondary | ICD-10-CM | POA: Diagnosis not present

## 2022-12-28 DIAGNOSIS — S72001D Fracture of unspecified part of neck of right femur, subsequent encounter for closed fracture with routine healing: Secondary | ICD-10-CM | POA: Diagnosis not present

## 2022-12-28 DIAGNOSIS — Z9689 Presence of other specified functional implants: Secondary | ICD-10-CM | POA: Diagnosis not present

## 2022-12-28 DIAGNOSIS — J45909 Unspecified asthma, uncomplicated: Secondary | ICD-10-CM | POA: Diagnosis not present

## 2022-12-28 DIAGNOSIS — Z471 Aftercare following joint replacement surgery: Secondary | ICD-10-CM | POA: Diagnosis not present

## 2022-12-28 DIAGNOSIS — Z743 Need for continuous supervision: Secondary | ICD-10-CM | POA: Diagnosis not present

## 2022-12-28 DIAGNOSIS — Z888 Allergy status to other drugs, medicaments and biological substances status: Secondary | ICD-10-CM

## 2022-12-28 DIAGNOSIS — S72001A Fracture of unspecified part of neck of right femur, initial encounter for closed fracture: Principal | ICD-10-CM

## 2022-12-28 DIAGNOSIS — E1165 Type 2 diabetes mellitus with hyperglycemia: Secondary | ICD-10-CM

## 2022-12-28 DIAGNOSIS — R29818 Other symptoms and signs involving the nervous system: Secondary | ICD-10-CM | POA: Diagnosis not present

## 2022-12-28 DIAGNOSIS — Z7983 Long term (current) use of bisphosphonates: Secondary | ICD-10-CM

## 2022-12-28 DIAGNOSIS — E785 Hyperlipidemia, unspecified: Secondary | ICD-10-CM | POA: Diagnosis not present

## 2022-12-28 DIAGNOSIS — K219 Gastro-esophageal reflux disease without esophagitis: Secondary | ICD-10-CM

## 2022-12-28 DIAGNOSIS — D72828 Other elevated white blood cell count: Secondary | ICD-10-CM | POA: Diagnosis not present

## 2022-12-28 DIAGNOSIS — Z7901 Long term (current) use of anticoagulants: Secondary | ICD-10-CM | POA: Diagnosis not present

## 2022-12-28 DIAGNOSIS — I10 Essential (primary) hypertension: Secondary | ICD-10-CM | POA: Diagnosis not present

## 2022-12-28 DIAGNOSIS — E782 Mixed hyperlipidemia: Secondary | ICD-10-CM | POA: Diagnosis not present

## 2022-12-28 DIAGNOSIS — Z7401 Bed confinement status: Secondary | ICD-10-CM | POA: Diagnosis not present

## 2022-12-28 DIAGNOSIS — Z880 Allergy status to penicillin: Secondary | ICD-10-CM

## 2022-12-28 DIAGNOSIS — J4520 Mild intermittent asthma, uncomplicated: Secondary | ICD-10-CM

## 2022-12-28 DIAGNOSIS — Y9301 Activity, walking, marching and hiking: Secondary | ICD-10-CM | POA: Diagnosis present

## 2022-12-28 DIAGNOSIS — I69959 Hemiplegia and hemiparesis following unspecified cerebrovascular disease affecting unspecified side: Secondary | ICD-10-CM | POA: Diagnosis not present

## 2022-12-28 DIAGNOSIS — R6889 Other general symptoms and signs: Secondary | ICD-10-CM | POA: Diagnosis not present

## 2022-12-28 DIAGNOSIS — F32A Depression, unspecified: Secondary | ICD-10-CM

## 2022-12-28 DIAGNOSIS — Z803 Family history of malignant neoplasm of breast: Secondary | ICD-10-CM

## 2022-12-28 DIAGNOSIS — M1811 Unilateral primary osteoarthritis of first carpometacarpal joint, right hand: Secondary | ICD-10-CM | POA: Diagnosis not present

## 2022-12-28 DIAGNOSIS — R278 Other lack of coordination: Secondary | ICD-10-CM | POA: Diagnosis not present

## 2022-12-28 DIAGNOSIS — D631 Anemia in chronic kidney disease: Secondary | ICD-10-CM | POA: Diagnosis not present

## 2022-12-28 DIAGNOSIS — M6281 Muscle weakness (generalized): Secondary | ICD-10-CM | POA: Diagnosis not present

## 2022-12-28 DIAGNOSIS — W19XXXA Unspecified fall, initial encounter: Secondary | ICD-10-CM | POA: Diagnosis not present

## 2022-12-28 DIAGNOSIS — M5030 Other cervical disc degeneration, unspecified cervical region: Secondary | ICD-10-CM | POA: Diagnosis not present

## 2022-12-28 DIAGNOSIS — G20A1 Parkinson's disease without dyskinesia, without mention of fluctuations: Secondary | ICD-10-CM | POA: Insufficient documentation

## 2022-12-28 DIAGNOSIS — M25531 Pain in right wrist: Secondary | ICD-10-CM | POA: Diagnosis present

## 2022-12-28 DIAGNOSIS — M810 Age-related osteoporosis without current pathological fracture: Secondary | ICD-10-CM | POA: Diagnosis present

## 2022-12-28 DIAGNOSIS — Z8249 Family history of ischemic heart disease and other diseases of the circulatory system: Secondary | ICD-10-CM | POA: Diagnosis not present

## 2022-12-28 DIAGNOSIS — N1831 Chronic kidney disease, stage 3a: Secondary | ICD-10-CM | POA: Diagnosis present

## 2022-12-28 DIAGNOSIS — S72011A Unspecified intracapsular fracture of right femur, initial encounter for closed fracture: Principal | ICD-10-CM | POA: Diagnosis present

## 2022-12-28 DIAGNOSIS — Z7902 Long term (current) use of antithrombotics/antiplatelets: Secondary | ICD-10-CM

## 2022-12-28 DIAGNOSIS — G250 Essential tremor: Secondary | ICD-10-CM | POA: Diagnosis not present

## 2022-12-28 DIAGNOSIS — G2581 Restless legs syndrome: Secondary | ICD-10-CM | POA: Diagnosis present

## 2022-12-28 DIAGNOSIS — Z043 Encounter for examination and observation following other accident: Secondary | ICD-10-CM | POA: Diagnosis not present

## 2022-12-28 DIAGNOSIS — Y92038 Other place in apartment as the place of occurrence of the external cause: Secondary | ICD-10-CM

## 2022-12-28 DIAGNOSIS — Z96641 Presence of right artificial hip joint: Secondary | ICD-10-CM | POA: Diagnosis not present

## 2022-12-28 DIAGNOSIS — E860 Dehydration: Secondary | ICD-10-CM | POA: Diagnosis not present

## 2022-12-28 DIAGNOSIS — E1122 Type 2 diabetes mellitus with diabetic chronic kidney disease: Secondary | ICD-10-CM | POA: Diagnosis present

## 2022-12-28 DIAGNOSIS — S79919A Unspecified injury of unspecified hip, initial encounter: Secondary | ICD-10-CM | POA: Diagnosis not present

## 2022-12-28 DIAGNOSIS — Z8673 Personal history of transient ischemic attack (TIA), and cerebral infarction without residual deficits: Secondary | ICD-10-CM

## 2022-12-28 DIAGNOSIS — G8929 Other chronic pain: Secondary | ICD-10-CM | POA: Diagnosis not present

## 2022-12-28 DIAGNOSIS — Z7984 Long term (current) use of oral hypoglycemic drugs: Secondary | ICD-10-CM

## 2022-12-28 DIAGNOSIS — F419 Anxiety disorder, unspecified: Secondary | ICD-10-CM | POA: Diagnosis present

## 2022-12-28 DIAGNOSIS — R29898 Other symptoms and signs involving the musculoskeletal system: Secondary | ICD-10-CM | POA: Diagnosis not present

## 2022-12-28 DIAGNOSIS — E119 Type 2 diabetes mellitus without complications: Secondary | ICD-10-CM | POA: Diagnosis not present

## 2022-12-28 DIAGNOSIS — Z87891 Personal history of nicotine dependence: Secondary | ICD-10-CM | POA: Diagnosis not present

## 2022-12-28 DIAGNOSIS — Z882 Allergy status to sulfonamides status: Secondary | ICD-10-CM

## 2022-12-28 DIAGNOSIS — Z90710 Acquired absence of both cervix and uterus: Secondary | ICD-10-CM

## 2022-12-28 DIAGNOSIS — Z9682 Presence of neurostimulator: Secondary | ICD-10-CM

## 2022-12-28 DIAGNOSIS — Z79899 Other long term (current) drug therapy: Secondary | ICD-10-CM

## 2022-12-28 DIAGNOSIS — Z885 Allergy status to narcotic agent status: Secondary | ICD-10-CM

## 2022-12-28 DIAGNOSIS — R262 Difficulty in walking, not elsewhere classified: Secondary | ICD-10-CM | POA: Diagnosis not present

## 2022-12-28 LAB — CBC WITH DIFFERENTIAL/PLATELET
Abs Immature Granulocytes: 0.1 10*3/uL — ABNORMAL HIGH (ref 0.00–0.07)
Basophils Absolute: 0.1 10*3/uL (ref 0.0–0.1)
Basophils Relative: 1 %
Eosinophils Absolute: 0.2 10*3/uL (ref 0.0–0.5)
Eosinophils Relative: 2 %
HCT: 38.6 % (ref 36.0–46.0)
Hemoglobin: 12 g/dL (ref 12.0–15.0)
Immature Granulocytes: 1 %
Lymphocytes Relative: 13 %
Lymphs Abs: 1.6 10*3/uL (ref 0.7–4.0)
MCH: 27.8 pg (ref 26.0–34.0)
MCHC: 31.1 g/dL (ref 30.0–36.0)
MCV: 89.6 fL (ref 80.0–100.0)
Monocytes Absolute: 0.6 10*3/uL (ref 0.1–1.0)
Monocytes Relative: 5 %
Neutro Abs: 10.2 10*3/uL — ABNORMAL HIGH (ref 1.7–7.7)
Neutrophils Relative %: 78 %
Platelets: 220 10*3/uL (ref 150–400)
RBC: 4.31 MIL/uL (ref 3.87–5.11)
RDW: 13.2 % (ref 11.5–15.5)
WBC: 12.8 10*3/uL — ABNORMAL HIGH (ref 4.0–10.5)
nRBC: 0 % (ref 0.0–0.2)

## 2022-12-28 LAB — BASIC METABOLIC PANEL
Anion gap: 8 (ref 5–15)
BUN: 16 mg/dL (ref 8–23)
CO2: 20 mmol/L — ABNORMAL LOW (ref 22–32)
Calcium: 8.6 mg/dL — ABNORMAL LOW (ref 8.9–10.3)
Chloride: 108 mmol/L (ref 98–111)
Creatinine, Ser: 1.15 mg/dL — ABNORMAL HIGH (ref 0.44–1.00)
GFR, Estimated: 50 mL/min — ABNORMAL LOW (ref >60)
Glucose, Bld: 155 mg/dL — ABNORMAL HIGH (ref 70–99)
Potassium: 4.2 mmol/L (ref 3.5–5.1)
Sodium: 136 mmol/L (ref 135–145)

## 2022-12-28 LAB — TYPE AND SCREEN
ABO/RH(D): A POS
Antibody Screen: NEGATIVE

## 2022-12-28 LAB — GLUCOSE, CAPILLARY: Glucose-Capillary: 175 mg/dL — ABNORMAL HIGH (ref 70–99)

## 2022-12-28 LAB — PROTIME-INR
INR: 1.1 (ref 0.8–1.2)
Prothrombin Time: 14.2 s (ref 11.4–15.2)

## 2022-12-28 MED ORDER — LACTATED RINGERS IV SOLN: INTRAVENOUS | Status: AC

## 2022-12-28 MED ORDER — FENTANYL CITRATE PF 50 MCG/ML IJ SOSY
50.0000 ug | PREFILLED_SYRINGE | INTRAMUSCULAR | Status: AC | PRN
  Administered 2022-12-28 (×2): 50 ug via INTRAVENOUS
  Filled 2022-12-28 (×2): qty 1

## 2022-12-28 MED ORDER — PROPRANOLOL HCL ER 80 MG PO CP24
80.0000 mg | ORAL_CAPSULE | Freq: Every day | ORAL | Status: DC
  Administered 2022-12-30 – 2022-12-31 (×3): 80 mg via ORAL
  Filled 2022-12-28 (×5): qty 1

## 2022-12-28 MED ORDER — ACETAMINOPHEN 650 MG RE SUPP: 650.0000 mg | Freq: Four times a day (QID) | RECTAL | Status: DC | PRN

## 2022-12-28 MED ORDER — MONTELUKAST SODIUM 10 MG PO TABS
10.0000 mg | ORAL_TABLET | Freq: Every day | ORAL | Status: DC
Start: 2022-12-29 — End: 2022-12-31
  Administered 2022-12-29 – 2022-12-31 (×3): 10 mg via ORAL
  Filled 2022-12-28 (×3): qty 1

## 2022-12-28 MED ORDER — INSULIN ASPART 100 UNIT/ML IJ SOLN
0.0000 [IU] | INTRAMUSCULAR | Status: DC
  Administered 2022-12-28: 2 [IU] via SUBCUTANEOUS
  Administered 2022-12-29: 5 [IU] via SUBCUTANEOUS
  Administered 2022-12-29: 3 [IU] via SUBCUTANEOUS
  Administered 2022-12-29 – 2022-12-30 (×5): 2 [IU] via SUBCUTANEOUS
  Administered 2022-12-30: 1 [IU] via SUBCUTANEOUS
  Administered 2022-12-30: 3 [IU] via SUBCUTANEOUS
  Administered 2022-12-30 – 2022-12-31 (×3): 2 [IU] via SUBCUTANEOUS

## 2022-12-28 MED ORDER — TRANEXAMIC ACID-NACL 1000-0.7 MG/100ML-% IV SOLN
1000.0000 mg | INTRAVENOUS | Status: AC
  Administered 2022-12-29: 1000 mg via INTRAVENOUS
  Filled 2022-12-28: qty 100

## 2022-12-28 MED ORDER — PANTOPRAZOLE SODIUM 40 MG PO TBEC
40.0000 mg | DELAYED_RELEASE_TABLET | Freq: Every day | ORAL | Status: DC
  Administered 2022-12-29 – 2022-12-31 (×3): 40 mg via ORAL
  Filled 2022-12-28 (×3): qty 1

## 2022-12-28 MED ORDER — ESCITALOPRAM OXALATE 10 MG PO TABS
20.0000 mg | ORAL_TABLET | Freq: Every day | ORAL | Status: DC
  Administered 2022-12-29 – 2022-12-31 (×3): 20 mg via ORAL
  Filled 2022-12-28 (×3): qty 2

## 2022-12-28 MED ORDER — CEFAZOLIN SODIUM-DEXTROSE 2-4 GM/100ML-% IV SOLN
2.0000 g | INTRAVENOUS | Status: AC
  Administered 2022-12-29: 2 g via INTRAVENOUS
  Filled 2022-12-28: qty 100

## 2022-12-28 MED ORDER — ACETAMINOPHEN 325 MG PO TABS
650.0000 mg | ORAL_TABLET | Freq: Four times a day (QID) | ORAL | Status: DC | PRN
  Administered 2022-12-28 – 2022-12-29 (×2): 650 mg via ORAL
  Filled 2022-12-28 (×2): qty 2

## 2022-12-28 MED ORDER — ATORVASTATIN CALCIUM 40 MG PO TABS
80.0000 mg | ORAL_TABLET | Freq: Every day | ORAL | Status: DC
  Administered 2022-12-29: 80 mg via ORAL
  Filled 2022-12-28: qty 2

## 2022-12-28 MED ORDER — ONDANSETRON HCL 4 MG/2ML IJ SOLN: 4.0000 mg | Freq: Four times a day (QID) | INTRAMUSCULAR | Status: DC | PRN

## 2022-12-28 MED ORDER — FENTANYL CITRATE PF 50 MCG/ML IJ SOSY
25.0000 ug | PREFILLED_SYRINGE | INTRAMUSCULAR | Status: DC | PRN
  Administered 2022-12-29: 25 ug via INTRAVENOUS
  Filled 2022-12-28: qty 1

## 2022-12-28 MED ORDER — LISINOPRIL 10 MG PO TABS
20.0000 mg | ORAL_TABLET | Freq: Every day | ORAL | Status: DC
Start: 2022-12-29 — End: 2022-12-31
  Administered 2022-12-29 – 2022-12-31 (×3): 20 mg via ORAL
  Filled 2022-12-28 (×3): qty 2

## 2022-12-28 MED ORDER — CARBIDOPA-LEVODOPA 25-100 MG PO TABS
1.0000 | ORAL_TABLET | Freq: Three times a day (TID) | ORAL | Status: DC
  Administered 2022-12-28 – 2022-12-31 (×6): 1 via ORAL
  Filled 2022-12-28 (×15): qty 1

## 2022-12-28 MED ORDER — PROCHLORPERAZINE EDISYLATE 10 MG/2ML IJ SOLN: 10.0000 mg | Freq: Four times a day (QID) | INTRAMUSCULAR | Status: DC | PRN

## 2022-12-28 NOTE — ED Provider Notes (Signed)
Beth Young EMERGENCY DEPARTMENT AT [facility] Provider Note   CSN: [id number] Arrival date & time: 12/28/22  1618     History  Chief Complaint  Patient presents with   Beth Young is a 77 y.o. female.   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  She presents to the hospital after having an accidental fall while she was walking in her house landing on her right hip, this caused acute onset of pain and the inability to stand up.  Paramedics found the patient to have a tender right hip and buttock, they gave her morphine prehospital, she denies hitting her head, she has no numbness or weakness, she has no chest pain or shortness of breath, she has no nausea or vomiting.  She does have a history of prior ankle surgery in the past but no other significant broken bones    Home Medications Prior to Admission medications   Medication Sig Start Date End Date Taking? Authorizing Provider  albuterol (PROVENTIL HFA;VENTOLIN HFA) 108 (90 Base) MCG/ACT inhaler Inhale 1 puff into the lungs every 6 (six) hours as needed for wheezing or shortness of breath.   Yes [provider]  alendronate (FOSAMAX) 70 MG tablet Take 70 mg by mouth once a week. 12/15/22  Yes [provider]  atorvastatin (LIPITOR) 80 MG tablet Take 80 mg by mouth daily.  04/17/16 12/28/22 Yes [provider]  carbidopa-levodopa (SINEMET IR) 25-100 MG tablet Take 1 tablet by mouth 3 (three) times daily.   Yes [provider]  clopidogrel (PLAVIX) 75 MG tablet Take 75 mg by mouth daily.    Yes [provider]  dicyclomine (BENTYL) 10 MG capsule Take 1 capsule (10 mg total) by mouth 2 (two) times daily as needed (diarrhea). Patient taking differently: Take  10 mg by mouth 3 (three) times daily as needed for spasms (diarrhea). 11/12/18  Yes Tiffany Kocher, PA-C  doxepin (SINEQUAN) 25 MG capsule Take 25 mg by mouth at bedtime.    Yes [provider]  escitalopram (LEXAPRO) 20 MG tablet Take 20 mg by mouth daily. 07/03/17  Yes [provider]  ferrous sulfate 325 (65 FE) MG tablet Take 325 mg by mouth daily.   Yes [provider]  fluticasone (FLONASE) 50 MCG/ACT nasal spray Place 2 sprays into both nostrils daily as needed for allergies or rhinitis.   Yes [provider]  glipiZIDE (GLUCOTROL XL) 5 MG 24 hr tablet Take 5 mg by mouth every morning. 12/15/22  Yes [provider]  lisinopril (ZESTRIL) 20 MG tablet Take 20 mg by mouth daily.  09/24/18  Yes [provider]  montelukast (SINGULAIR) 10 MG tablet Take 10 mg by mouth daily.  07/26/13  Yes [provider]  omeprazole (PRILOSEC) 20 MG capsule Take 20 mg by mouth daily. TAKE 1 CAPSULE (20 MG TOTAL) BY MOUTH DAILY. 07/13/13  Yes [provider]  propranolol ER (INDERAL LA) 80 MG 24 hr capsule Take 80 mg by mouth daily. For hypertension   Yes [provider]  topiramate (TOPAMAX) 50 MG tablet Take 50 mg by mouth at bedtime.    Yes [provider]      Allergies    Bupropion, Codeine, Tape, Trichlormethiazide, Penicillins, Silver,  and Sulfa antibiotics    Review of Systems   Review of Systems  All other systems reviewed and are negative.   Physical Exam Updated Vital Signs BP 108/70   Pulse 68   Resp 10   Ht 1.549 m (5\' 1" )   Wt 60.3 kg   SpO2 92%   BMI 25.12 kg/m  Physical Exam Vitals and nursing note reviewed.  Constitutional:      General: She is not in acute distress.    Appearance: She is well-developed.  HENT:     Head: Normocephalic and atraumatic.     Mouth/Throat:     Pharynx: No oropharyngeal exudate.  Eyes:     General: No scleral icterus.       Right eye: No discharge.        Left  eye: No discharge.     Conjunctiva/sclera: Conjunctivae normal.     Pupils: Pupils are equal, round, and reactive to light.  Neck:     Thyroid: No thyromegaly.     Vascular: No JVD.  Cardiovascular:     Rate and Rhythm: Normal rate and regular rhythm.     Heart sounds: Normal heart sounds. No murmur heard.    No friction rub. No gallop.  Pulmonary:     Effort: Pulmonary effort is normal. No respiratory distress.     Breath sounds: Normal breath sounds. No wheezing or rales.  Abdominal:     General: Bowel sounds are normal. There is no distension.     Palpations: Abdomen is soft. There is no mass.     Tenderness: There is no abdominal tenderness.  Musculoskeletal:        General: Tenderness, deformity and signs of injury present.     Cervical back: Normal range of motion and neck supple.     Right lower leg: No edema.     Left lower leg: No edema.     Comments: Decreased range of motion of the right hip secondary to pain with any internal or external rotation as well as flexion or extension.  She is shortened and externally rotated on the right.  Upper extremities and left lower extremity are normal.  Pelvis is stable without tenderness  Lymphadenopathy:     Cervical: No cervical adenopathy.  Skin:    General: Skin is warm and dry.     Findings: No erythema or rash.  Neurological:     Mental Status: She is alert.     Coordination: Coordination normal.  Psychiatric:        Behavior: Behavior normal.     ED Results / Procedures / Treatments   Labs (all labs ordered are listed, but only abnormal results are displayed) Labs Reviewed  BASIC METABOLIC PANEL - Abnormal; Notable for the following components:      Result Value   CO2 20 (*)    Glucose, Bld 155 (*)    Creatinine, Ser 1.15 (*)    Calcium 8.6 (*)    GFR, Estimated 50 (*)    All other components within normal limits  CBC WITH DIFFERENTIAL/PLATELET - Abnormal; Notable for the following components:   WBC 12.8 (*)     Neutro Abs 10.2 (*)    Abs Immature Granulocytes 0.10 (*)    All other components within normal limits  PROTIME-INR  TYPE AND SCREEN    EKG EKG Interpretation Date/Time:  Sunday December 28 2022 16:50:08 EDT Ventricular Rate:  64 PR Interval:  176 QRS Duration:  60 QT Interval:  544 QTC Calculation: 562 R Axis:   68  Text Interpretation: Poor quality data, interpretation may be affected Sinus rhythm Consider left atrial enlargement Borderline ST depression, diffuse leads Prolonged QT interval Artifact in lead(s) I II III aVR aVL aVF V1 V2 V3 V4 V5 V6 Confirmed by Eber Hong ([id number]) on 12/28/2022 5:45:03 PM  Radiology DG Chest 1 View  Result Date: 12/28/2022 CLINICAL DATA:  Fall EXAM: CHEST  1 VIEW COMPARISON:  None Available. FINDINGS: Lungs volumes are small, but are symmetric and are clear. No pneumothorax or pleural effusion. Cardiac size within normal limits. Pulmonary vascularity is normal. Osseous structures are age-appropriate. No acute bone abnormality. IMPRESSION: 1. Pulmonary hypoinflation. Electronically Signed   By: Helyn Numbers M.D.   On: 12/28/2022 19:03   DG Hip Unilat With Pelvis 2-3 Views Right  Result Date: 12/28/2022 CLINICAL DATA:  Fall, right leg deformed EXAM: DG HIP (WITH OR WITHOUT PELVIS) 2-3V RIGHT COMPARISON:  None Available. FINDINGS: There is an acute right subcapital femoral neck fracture with override and external rotation of the distal fracture fragment with resultant moderate varus angulation. Femoral head is still seated within the right acetabulum. Hip joint spaces are preserved. Visualized pelvis and left hip are intact. Neurostimulator device overlies the right iliac wing. Soft tissues are unremarkable. IMPRESSION: 1. Acute right subcapital femoral neck fracture with override and varus angulation. Electronically Signed   By: Helyn Numbers M.D.   On: 12/28/2022 19:03    Procedures Procedures    Medications Ordered in ED Medications   fentaNYL (SUBLIMAZE) injection 50 mcg (50 mcg Intravenous Given 12/28/22 1859)  ondansetron (ZOFRAN) injection 4 mg (has no administration in time range)  ceFAZolin (ANCEF) IVPB 2g/100 mL premix (has no administration in time range)  tranexamic acid (CYKLOKAPRON) IVPB 1,000 mg (has no administration in time range)    ED Course/ Medical Decision Making/ A&P                                 Medical Decision Making Amount and/or Complexity of Data Reviewed Labs: ordered. Radiology: ordered.  Risk Prescription drug management. Decision regarding hospitalization.   Patient has had a fall and suffered a likely right hip fracture, she will need imaging and likely surgical consultation.  Preop labs and EKG will be ordered.  Pain medication in the form of fentanyl given.   This patient presents to the ED for concern of fall differential diagnosis includes hip fracture, pelvic fracture, dislocation    Additional history obtained:  Additional history obtained from medical record External records from outside source obtained and reviewed including office visits for diabetes, no recent admissions to the hospital, she has a brain stimulator that has been placed and is in the right lower abdomen and pelvis   Lab Tests:  I Ordered, and personally interpreted labs.  The pertinent results include: BC with mild leukocytosis, metabolic panel unremarkable   Imaging Studies ordered:  I ordered imaging studies including x-ray of the hip I independently visualized and interpreted imaging which showed femoral neck fracture on the right I agree with the radiologist interpretation   Medicines ordered and prescription drug management:  I ordered medication including fentanyl and Zofran as needed for pain Reevaluation of the patient after these medicines showed that the patient improved I have reviewed the patients home medicines and have made adjustments as needed   Problem List / ED  Course:  Hip fracture right, he  will need to be admitted, stable otherwise, last dose of Plavix was this morning, discussed with orthopedics, Dr. Dallas Schimke who I spoke with at 5:45 PM, request the patient be admitted and will see the patient for likely surgery tomorrow Will discuss with hospitalist for admission   Social Determinants of Health:  None           Final Clinical Impression(s) / ED Diagnoses Final diagnoses:  Hip fracture, right, closed, initial encounter [facility])    Rx / DC Orders ED Discharge Orders     None         Eber Hong, MD 12/28/22 1930

## 2022-12-28 NOTE — ED Triage Notes (Signed)
Pt BIB EMS for fall. Pt fell in hallway, pt complaining of rt buttock pain. 10/10 pain with EMS and 10 morphine, 6/10 after pain meds.

## 2022-12-28 NOTE — H&P (Incomplete)
History and Physical    Patient: Beth Young Beth Young DOB: 08/03/1945 DOA: 12/28/2022 DOS: the patient was seen and examined on 12/28/2022 PCP: Allwardt, Crist Infante, PA-C  Patient coming from: Home  Chief Complaint:  Chief Complaint  Patient presents with   Fall   HPI: Beth Young is a 77 y.o. female with medical history significant of hypertension, hyperlipidemia, Parkinson's disease with use of electrical brain stimulation, depression, type 2 diabetes mellitus, GERD, asthma who presents to the emergency department from home via EMS Due to fall sustained at home.  Patient sustained a fall while walking in her hallway and landed on her right hip resulting in pain of the hip with difficulty in being able to get up from the floor.  EMS was activated and on arrival of EMS and patient was noted with tenderness of right hip and buttock, patient was complaining of pain rated as 10/10 on pain scale, morphine was given, pain is improved to 6/10 on pain scale, patient was taken to the ED for further evaluation and management.  Patient denies hitting her head or loss of consciousness, she denies chest pain, shortness of breath nausea or vomiting.  ED Course:  In the emergency department, she was tachycardic with HR of 101 bpm, other vital signs are within normal range.  Workup in the ED showed normal CBC except for WBC of 12.8, BMP was normal except for bicarb of 20, blood glucose of 455 and creatinine of 1.15 Right hip x-ray showed acute right subcapital femoral neck fracture with override and varus angulation. Chest x-ray shows pulmonary hypoinflation IV fentanyl 50 mcg was given.  Orthopedic surgeon (Dr. Dallas Schimke) was consulted and will follow-up with patient in the morning.  IV cefazolin 2 g x 1 was given for surgical prophylaxis. Hospitalist was asked admit patient for further evaluation and management.   Review of Systems: Review of systems as noted in the HPI. All other systems  reviewed and are negative.   Past Medical History:  Diagnosis Date   Anxiety    Arthritis    Asthma    Depression    Essential tremor    with implantable brain stimulator device   GERD (gastroesophageal reflux disease)    History of stroke    Hyperlipidemia    Hypertension    Insomnia    Osteoporosis    Parkinson's disease with use of electrical brain stimulation (HCC)    Restless legs syndrome    Type 2 diabetes mellitus (HCC)    Past Surgical History:  Procedure Laterality Date   ABDOMINAL HYSTERECTOMY     ANKLE SURGERY Right    BIOPSY  05/04/2017   Procedure: BIOPSY;  Surgeon: Corbin Ade, MD;  Location: [facility];  Service: Endoscopy;;  ascending, descending, sigmoid bx's   BURR HOLE W/ STEREOTACTIC INSERTION OF DBS LEADS / INTRAOP MICROELECTRODE RECORDING  04/03/2012   CATARACT EXTRACTION, BILATERAL     COLONOSCOPY WITH PROPOFOL N/A 05/04/2017   Dr. Jena Gauss: diverticulosis, random colon biopsies were negative.    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   HAND SURGERY Bilateral    joint replacement, bilateral thumbs    Social History:  reports that she quit smoking about 10 years ago. Her smoking use included cigarettes. She started smoking about 15 years ago. She has a 2.5 pack-year smoking history. Her smokeless tobacco use includes snuff.  She reports that she does not drink alcohol and does not use drugs.   Allergies  Allergen Reactions   Bupropion Other (See Comments)   Codeine Other (See Comments)    Jittery   Tape Other (See Comments)    Uncoded Allergy. Allergen: BANDAIDS   Trichlormethiazide Other (See Comments)   Penicillins Rash   Silver Rash    Patient has moderate blistering with tegaderm use   Sulfa Antibiotics Itching    Family History  Problem Relation Age of Onset   Dementia Mother    Heart attack Father    Breast cancer Sister    Other Son         ???celiac   Colon cancer Neg Hx    Gastric cancer Neg Hx    Esophageal cancer Neg Hx      Prior to Admission medications   Medication Sig Start Date End Date Taking? Authorizing Provider  albuterol (PROVENTIL HFA;VENTOLIN HFA) 108 (90 Base) MCG/ACT inhaler Inhale 1 puff into the lungs every 6 (six) hours as needed for wheezing or shortness of breath.   Yes [provider]  alendronate (FOSAMAX) 70 MG tablet Take 70 mg by mouth once a week. 12/15/22  Yes [provider]  atorvastatin (LIPITOR) 80 MG tablet Take 80 mg by mouth daily.  04/17/16 12/28/22 Yes [provider]  carbidopa-levodopa (SINEMET IR) 25-100 MG tablet Take 1 tablet by mouth 3 (three) times daily.   Yes [provider]  clopidogrel (PLAVIX) 75 MG tablet Take 75 mg by mouth daily.    Yes [provider]  dicyclomine (BENTYL) 10 MG capsule Take 1 capsule (10 mg total) by mouth 2 (two) times daily as needed (diarrhea). Patient taking differently: Take 10 mg by mouth 3 (three) times daily as needed for spasms (diarrhea). 11/12/18  Yes Tiffany Kocher, PA-C  doxepin (SINEQUAN) 25 MG capsule Take 25 mg by mouth at bedtime.    Yes [provider]  escitalopram (LEXAPRO) 20 MG tablet Take 20 mg by mouth daily. 07/03/17  Yes [provider]  ferrous sulfate 325 (65 FE) MG tablet Take 325 mg by mouth daily.   Yes [provider]  fluticasone (FLONASE) 50 MCG/ACT nasal spray Place 2 sprays into both nostrils daily as needed for allergies or rhinitis.   Yes [provider]  glipiZIDE (GLUCOTROL XL) 5 MG 24 hr tablet Take 5 mg by mouth every morning. 12/15/22  Yes [provider]  lisinopril (ZESTRIL) 20 MG tablet Take 20 mg by mouth daily.  09/24/18  Yes [provider]  montelukast (SINGULAIR) 10 MG tablet Take 10 mg by mouth daily.  07/26/13  Yes [provider]  omeprazole (PRILOSEC) 20 MG capsule Take 20 mg by mouth daily. TAKE 1 CAPSULE  (20 MG TOTAL) BY MOUTH DAILY. 07/13/13  Yes [provider]  propranolol ER (INDERAL LA) 80 MG 24 hr capsule Take 80 mg by mouth daily. For hypertension   Yes [provider]  topiramate (TOPAMAX) 50 MG tablet Take 50 mg by mouth at bedtime.    Yes [provider]    Physical Exam: BP (!) 117/45   Pulse 71   Temp 98.6 F (37 C) (Oral)   Resp 10   Ht 5\' 1"  (1.549 m)   Wt 60.3 kg   SpO2 99%   BMI 25.12 kg/m   General: 77 y.o. year-old female well developed well nourished in no acute distress.  Alert and oriented x3. HEENT: NCAT,  EOMI, dry mucous membrane Neck: Supple, trachea medial Cardiovascular: Regular rate and rhythm with no rubs or gallops.  No thyromegaly or JVD noted.  No lower extremity edema. 2/4 pulses in all 4 extremities. Respiratory: Clear to auscultation with no wheezes or rales. Good inspiratory effort. Abdomen: Soft, nontender nondistended with normal bowel sounds x4 quadrants. Muskuloskeletal: Tender to palpation of right hip.  Decreased ROM of RLE due to pain.  RLE is shortened and externally rotated.   Neuro: CN II-XII intact, sensation, reflexes intact Skin: No ulcerative lesions noted or rashes Psychiatry: Judgement and insight appear normal. Mood is appropriate for condition and setting          Labs on Admission:  Basic Metabolic Panel: Recent Labs  Lab 12/28/22 1722  NA 136  K 4.2  CL 108  CO2 20*  GLUCOSE 155*  BUN 16  CREATININE 1.15*  CALCIUM 8.6*   Liver Function Tests: No results for input(s): "AST", "ALT", "ALKPHOS", "BILITOT", "PROT", "ALBUMIN" in the last 168 hours. No results for input(s): "LIPASE", "AMYLASE" in the last 168 hours. No results for input(s): "AMMONIA" in the last 168 hours. CBC: Recent Labs  Lab 12/28/22 1722  WBC 12.8*  NEUTROABS 10.2*  HGB 12.0  HCT 38.6  MCV 89.6  PLT 220   Cardiac Enzymes: No results for input(s): "CKTOTAL", "CKMB", "CKMBINDEX", "TROPONINI" in the last 168  hours.  BNP (last 3 results) No results for input(s): "BNP" in the last 8760 hours.  ProBNP (last 3 results) No results for input(s): "PROBNP" in the last 8760 hours.  CBG: No results for input(s): "GLUCAP" in the last 168 hours.  Radiological Exams on Admission: DG Chest 1 View  Result Date: 12/28/2022 CLINICAL DATA:  Fall EXAM: CHEST  1 VIEW COMPARISON:  None Available. FINDINGS: Lungs volumes are small, but are symmetric and are clear. No pneumothorax or pleural effusion. Cardiac size within normal limits. Pulmonary vascularity is normal. Osseous structures are age-appropriate. No acute bone abnormality. IMPRESSION: 1. Pulmonary hypoinflation. Electronically Signed   By: Helyn Numbers M.D.   On: 12/28/2022 19:03   DG Hip Unilat With Pelvis 2-3 Views Right  Result Date: 12/28/2022 CLINICAL DATA:  Fall, right leg deformed EXAM: DG HIP (WITH OR WITHOUT PELVIS) 2-3V RIGHT COMPARISON:  None Available. FINDINGS: There is an acute right subcapital femoral neck fracture with override and external rotation of the distal fracture fragment with resultant moderate varus angulation. Femoral head is still seated within the right acetabulum. Hip joint spaces are preserved. Visualized pelvis and left hip are intact. Neurostimulator device overlies the right iliac wing. Soft tissues are unremarkable. IMPRESSION: 1. Acute right subcapital femoral neck fracture with override and varus angulation. Electronically Signed   By: Helyn Numbers M.D.   On: 12/28/2022 19:03    EKG: I independently viewed the EKG done and my findings are as followed: Normal sinus rhythm at rate of 65 bpm with prolonged QTc at 562 ms***   Assessment/Plan Present on Admission:  Closed displaced fracture of right femoral neck (HCC)  Mixed hyperlipidemia  GERD without esophagitis  Principal Problem:   Closed displaced fracture of right femoral neck (HCC) Active Problems:   GERD without esophagitis   Mixed hyperlipidemia    Essential hypertension   Type 2 diabetes mellitus with hyperglycemia (HCC)   Asthma, chronic   Depression   Parkinson disease (HCC)  Close displaced fracture of right femoral neck Right hip x-ray showed acute right subcapital femoral neck fracture with override and varus angulation  Continue IV fentanyl 25 mcg every 2 hours as needed for pain IV 2 g cefazolin was given for surgical prophylaxis Keep patient n.p.o. at midnight Orthopedic surgeon (Dr. Dallas Schimke) was already consulted and will consult on patient in the morning  Dehydration Continue IV hydration  Prolonged QT interval*** Avoid QT prolonging drugs Magnesium level will be checked Repeat EKG in the morning  Essential hypertension Continue lisinopril, propranolol  Mixed hyperlipidemia Continue Lipitor  Type 2 diabetes mellitus with hyperglycemia Continue ISS and hypoglycemia protocol Glipizide will be held at this time  GERD Continue Protonix  Asthma Continue montelukast  Parkinson's disease Continue Sinemet Patient has inserted electrical brain stimulation  Depression Continue Lexapro   DVT prophylaxis: SCDs  Advance Care Planning: CODE STATUS: Full code  Consults: Orthopedic surgery  Family Communication: Family at bedside (all questions answered 2014)  Severity of Illness: The appropriate patient status for this patient is INPATIENT. Inpatient status is judged to be reasonable and necessary in order to provide the required intensity of service to ensure the patient's safety. The patient's presenting symptoms, physical exam findings, and initial radiographic and laboratory data in the context of their chronic comorbidities is felt to place them at high risk for further clinical deterioration. Furthermore, it is not anticipated that the patient will be medically stable for discharge from the hospital within 2 midnights of admission.   * I certify that at the point of admission it is my clinical judgment  that the patient will require inpatient hospital care spanning beyond 2 midnights from the point of admission due to high intensity of service, high risk for further deterioration and high frequency of surveillance required.*  Author: Frankey Shown, DO 12/28/2022 9:09 PM  For on call review [email or web address].

## 2022-12-29 ENCOUNTER — Inpatient Hospital Stay (HOSPITAL_COMMUNITY): Payer: 59

## 2022-12-29 ENCOUNTER — Encounter (HOSPITAL_COMMUNITY): Payer: Self-pay | Admitting: Internal Medicine

## 2022-12-29 ENCOUNTER — Encounter (HOSPITAL_COMMUNITY)
Admission: EM | Disposition: A | Discharge status: Skilled Nursing Facility | Payer: Self-pay | Source: Home / Self Care | Attending: Family Medicine

## 2022-12-29 ENCOUNTER — Other Ambulatory Visit: Payer: Self-pay

## 2022-12-29 ENCOUNTER — Inpatient Hospital Stay (HOSPITAL_COMMUNITY): Payer: 59 | Admitting: Certified Registered"

## 2022-12-29 DIAGNOSIS — E119 Type 2 diabetes mellitus without complications: Secondary | ICD-10-CM | POA: Diagnosis not present

## 2022-12-29 DIAGNOSIS — S72001A Fracture of unspecified part of neck of right femur, initial encounter for closed fracture: Secondary | ICD-10-CM

## 2022-12-29 HISTORY — PX: HIP ARTHROPLASTY: SHX981

## 2022-12-29 LAB — MAGNESIUM: Magnesium: 2.1 mg/dL (ref 1.7–2.4)

## 2022-12-29 LAB — GLUCOSE, CAPILLARY
Glucose-Capillary: 122 mg/dL — ABNORMAL HIGH (ref 70–99)
Glucose-Capillary: 161 mg/dL — ABNORMAL HIGH (ref 70–99)
Glucose-Capillary: 162 mg/dL — ABNORMAL HIGH (ref 70–99)
Glucose-Capillary: 181 mg/dL — ABNORMAL HIGH (ref 70–99)
Glucose-Capillary: 182 mg/dL — ABNORMAL HIGH (ref 70–99)
Glucose-Capillary: 228 mg/dL — ABNORMAL HIGH (ref 70–99)
Glucose-Capillary: 237 mg/dL — ABNORMAL HIGH (ref 70–99)
Glucose-Capillary: 253 mg/dL — ABNORMAL HIGH (ref 70–99)

## 2022-12-29 LAB — COMPREHENSIVE METABOLIC PANEL
ALT: 23 U/L (ref 0–44)
AST: 49 U/L — ABNORMAL HIGH (ref 15–41)
Albumin: 3.3 g/dL — ABNORMAL LOW (ref 3.5–5.0)
Alkaline Phosphatase: 58 U/L (ref 38–126)
Anion gap: 8 (ref 5–15)
BUN: 19 mg/dL (ref 8–23)
CO2: 24 mmol/L (ref 22–32)
Calcium: 8.7 mg/dL — ABNORMAL LOW (ref 8.9–10.3)
Chloride: 104 mmol/L (ref 98–111)
Creatinine, Ser: 1.08 mg/dL — ABNORMAL HIGH (ref 0.44–1.00)
GFR, Estimated: 54 mL/min — ABNORMAL LOW (ref >60)
Glucose, Bld: 192 mg/dL — ABNORMAL HIGH (ref 70–99)
Potassium: 4.2 mmol/L (ref 3.5–5.1)
Sodium: 136 mmol/L (ref 135–145)
Total Bilirubin: 0.4 mg/dL (ref 0.3–1.2)
Total Protein: 6.6 g/dL (ref 6.5–8.1)

## 2022-12-29 LAB — CBC
HCT: 37.1 % (ref 36.0–46.0)
Hemoglobin: 11.5 g/dL — ABNORMAL LOW (ref 12.0–15.0)
MCH: 28 pg (ref 26.0–34.0)
MCHC: 31 g/dL (ref 30.0–36.0)
MCV: 90.3 fL (ref 80.0–100.0)
Platelets: 201 10*3/uL (ref 150–400)
RBC: 4.11 MIL/uL (ref 3.87–5.11)
RDW: 13.5 % (ref 11.5–15.5)
WBC: 11.5 10*3/uL — ABNORMAL HIGH (ref 4.0–10.5)
nRBC: 0 % (ref 0.0–0.2)

## 2022-12-29 LAB — HEMOGLOBIN A1C
Hgb A1c MFr Bld: 7.2 % — ABNORMAL HIGH (ref 4.8–5.6)
Mean Plasma Glucose: 159.94 mg/dL

## 2022-12-29 LAB — PHOSPHORUS: Phosphorus: 3.3 mg/dL (ref 2.5–4.6)

## 2022-12-29 LAB — MRSA NEXT GEN BY PCR, NASAL: MRSA by PCR Next Gen: NOT DETECTED

## 2022-12-29 SURGERY — HEMIARTHROPLASTY, HIP, DIRECT ANTERIOR APPROACH, FOR FRACTURE
Anesthesia: General | Site: Hip | Laterality: Right

## 2022-12-29 MED ORDER — FENTANYL CITRATE (PF) 100 MCG/2ML IJ SOLN
INTRAMUSCULAR | Status: DC | PRN
  Administered 2022-12-29 (×2): 50 ug via INTRAVENOUS

## 2022-12-29 MED ORDER — HYDROMORPHONE HCL 1 MG/ML IJ SOLN
0.2500 mg | INTRAMUSCULAR | Status: DC | PRN
Start: 2022-12-29 — End: 2022-12-29

## 2022-12-29 MED ORDER — ROCURONIUM BROMIDE 10 MG/ML (PF) SYRINGE
PREFILLED_SYRINGE | INTRAVENOUS | Status: DC | PRN
  Administered 2022-12-29: 50 mg via INTRAVENOUS

## 2022-12-29 MED ORDER — FENTANYL CITRATE (PF) 100 MCG/2ML IJ SOLN
INTRAMUSCULAR | Status: AC
  Filled 2022-12-29: qty 2

## 2022-12-29 MED ORDER — ROCURONIUM BROMIDE 10 MG/ML (PF) SYRINGE
PREFILLED_SYRINGE | INTRAVENOUS | Status: AC
  Filled 2022-12-29: qty 10

## 2022-12-29 MED ORDER — VANCOMYCIN HCL 1 G IV SOLR
INTRAVENOUS | Status: DC | PRN
  Administered 2022-12-29: 1000 mg via TOPICAL

## 2022-12-29 MED ORDER — MIDAZOLAM HCL 2 MG/2ML IJ SOLN
INTRAMUSCULAR | Status: AC
  Filled 2022-12-29: qty 2

## 2022-12-29 MED ORDER — HYDROMORPHONE HCL 1 MG/ML IJ SOLN
INTRAMUSCULAR | Status: AC
  Filled 2022-12-29: qty 1

## 2022-12-29 MED ORDER — PROPOFOL 500 MG/50ML IV EMUL
INTRAVENOUS | Status: AC
  Filled 2022-12-29: qty 100

## 2022-12-29 MED ORDER — PHENYLEPHRINE HCL-NACL 20-0.9 MG/250ML-% IV SOLN
INTRAVENOUS | Status: AC
  Filled 2022-12-29: qty 250

## 2022-12-29 MED ORDER — 0.9 % SODIUM CHLORIDE (POUR BTL) OPTIME
TOPICAL | Status: DC | PRN
  Administered 2022-12-29: 1000 mL

## 2022-12-29 MED ORDER — EPHEDRINE 5 MG/ML INJ
INTRAVENOUS | Status: AC
  Filled 2022-12-29: qty 5

## 2022-12-29 MED ORDER — ONDANSETRON HCL 4 MG/2ML IJ SOLN
INTRAMUSCULAR | Status: AC
  Filled 2022-12-29: qty 2

## 2022-12-29 MED ORDER — SODIUM CHLORIDE 0.9 % IV SOLN: INTRAVENOUS | Status: DC | PRN

## 2022-12-29 MED ORDER — ONDANSETRON HCL 4 MG/2ML IJ SOLN: 4.0000 mg | Freq: Once | INTRAMUSCULAR | Status: DC | PRN

## 2022-12-29 MED ORDER — BUPIVACAINE-EPINEPHRINE (PF) 0.5% -1:200000 IJ SOLN
INTRAMUSCULAR | Status: DC | PRN
  Administered 2022-12-29: 30 mL via PERINEURAL

## 2022-12-29 MED ORDER — SCOPOLAMINE 1 MG/3DAYS TD PT72
MEDICATED_PATCH | TRANSDERMAL | Status: AC
  Administered 2022-12-29: 1.5 mg via TRANSDERMAL
  Filled 2022-12-29: qty 1

## 2022-12-29 MED ORDER — VANCOMYCIN HCL 1000 MG IV SOLR
INTRAVENOUS | Status: AC
Start: 2022-12-29 — End: ?
  Filled 2022-12-29: qty 20

## 2022-12-29 MED ORDER — FENTANYL CITRATE PF 50 MCG/ML IJ SOSY
50.0000 ug | PREFILLED_SYRINGE | INTRAMUSCULAR | Status: DC | PRN
Start: 2022-12-29 — End: 2022-12-29
  Administered 2022-12-29: 50 ug via INTRAVENOUS

## 2022-12-29 MED ORDER — SUGAMMADEX SODIUM 200 MG/2ML IV SOLN
INTRAVENOUS | Status: DC | PRN
  Administered 2022-12-29: 200 mg via INTRAVENOUS

## 2022-12-29 MED ORDER — FENTANYL CITRATE PF 50 MCG/ML IJ SOSY
50.0000 ug | PREFILLED_SYRINGE | INTRAMUSCULAR | Status: DC | PRN
Start: 2022-12-29 — End: 2022-12-29

## 2022-12-29 MED ORDER — STERILE WATER FOR IRRIGATION IR SOLN
Status: DC | PRN
  Administered 2022-12-29: 1000 mL

## 2022-12-29 MED ORDER — PROPOFOL 500 MG/50ML IV EMUL
INTRAVENOUS | Status: DC | PRN
  Administered 2022-12-29: 75 ug/kg/min via INTRAVENOUS

## 2022-12-29 MED ORDER — BUPIVACAINE-EPINEPHRINE (PF) 0.5% -1:200000 IJ SOLN
INTRAMUSCULAR | Status: AC
  Filled 2022-12-29: qty 30

## 2022-12-29 MED ORDER — HYDRALAZINE HCL 20 MG/ML IJ SOLN: 10.0000 mg | Freq: Four times a day (QID) | INTRAMUSCULAR | Status: DC | PRN

## 2022-12-29 MED ORDER — PROPOFOL 500 MG/50ML IV EMUL
INTRAVENOUS | Status: AC
  Filled 2022-12-29: qty 50

## 2022-12-29 MED ORDER — DEXAMETHASONE SODIUM PHOSPHATE 10 MG/ML IJ SOLN
INTRAMUSCULAR | Status: DC | PRN
  Administered 2022-12-29: 6 mg via INTRAVENOUS

## 2022-12-29 MED ORDER — DEXAMETHASONE SODIUM PHOSPHATE 10 MG/ML IJ SOLN
INTRAMUSCULAR | Status: AC
Start: 2022-12-29 — End: ?
  Filled 2022-12-29: qty 1

## 2022-12-29 MED ORDER — PROPOFOL 10 MG/ML IV BOLUS
INTRAVENOUS | Status: DC | PRN
  Administered 2022-12-29: 130 mg via INTRAVENOUS

## 2022-12-29 MED ORDER — FENTANYL CITRATE PF 50 MCG/ML IJ SOSY
50.0000 ug | PREFILLED_SYRINGE | INTRAMUSCULAR | Status: DC | PRN
  Administered 2022-12-29 – 2022-12-30 (×4): 50 ug via INTRAVENOUS
  Filled 2022-12-29 (×5): qty 1

## 2022-12-29 MED ORDER — LIDOCAINE HCL (CARDIAC) PF 100 MG/5ML IV SOSY
PREFILLED_SYRINGE | INTRAVENOUS | Status: DC | PRN
  Administered 2022-12-29: 40 mg via INTRATRACHEAL

## 2022-12-29 MED ORDER — ONDANSETRON HCL 4 MG/2ML IJ SOLN
INTRAMUSCULAR | Status: DC | PRN
  Administered 2022-12-29: 4 mg via INTRAVENOUS

## 2022-12-29 MED ORDER — HYDROMORPHONE HCL 1 MG/ML IJ SOLN
INTRAMUSCULAR | Status: DC | PRN
  Administered 2022-12-29 (×2): .5 mg via INTRAVENOUS

## 2022-12-29 MED ORDER — CHLORHEXIDINE GLUCONATE 0.12 % MT SOLN
15.0000 mL | Freq: Once | OROMUCOSAL | Status: AC
  Administered 2022-12-29: 15 mL via OROMUCOSAL

## 2022-12-29 MED ORDER — ORAL CARE MOUTH RINSE
15.0000 mL | Freq: Once | OROMUCOSAL | Status: AC
Start: 2022-12-29 — End: 2022-12-29

## 2022-12-29 MED ORDER — PHENYLEPHRINE 80 MCG/ML (10ML) SYRINGE FOR IV PUSH (FOR BLOOD PRESSURE SUPPORT)
PREFILLED_SYRINGE | INTRAVENOUS | Status: DC | PRN
  Administered 2022-12-29: 320 ug via INTRAVENOUS
  Administered 2022-12-29 (×2): 80 ug via INTRAVENOUS
  Administered 2022-12-29: 160 ug via INTRAVENOUS

## 2022-12-29 MED ORDER — SCOPOLAMINE 1 MG/3DAYS TD PT72: 1.0000 | MEDICATED_PATCH | Freq: Once | TRANSDERMAL | Status: DC

## 2022-12-29 MED ORDER — SODIUM CHLORIDE 0.9 % IR SOLN
Status: DC | PRN
  Administered 2022-12-29: 3000 mL

## 2022-12-29 MED ORDER — CEFAZOLIN SODIUM-DEXTROSE 2-4 GM/100ML-% IV SOLN
2.0000 g | Freq: Three times a day (TID) | INTRAVENOUS | Status: AC
  Administered 2022-12-29 – 2022-12-30 (×3): 2 g via INTRAVENOUS
  Filled 2022-12-29 (×3): qty 100

## 2022-12-29 SURGICAL SUPPLY — 50 items
APL PRP STRL LF DISP 70% ISPRP (MISCELLANEOUS) ×1
BALL HIP ARTICU 28 +5 (Hips) IMPLANT
BIPOLAR PROS AML 45 (Hips) ×1 IMPLANT
BLADE SAGITTAL 25.0X1.27X90 (BLADE) ×1 IMPLANT
CHLORAPREP W/TINT 26 (MISCELLANEOUS) ×1 IMPLANT
CLOTH BEACON ORANGE TIMEOUT ST (SAFETY) ×1 IMPLANT
COVER LIGHT HANDLE STERIS (MISCELLANEOUS) ×2 IMPLANT
DECANTER SPIKE VIAL GLASS SM (MISCELLANEOUS) ×1 IMPLANT
DRAPE HIP W/POCKET STRL (MISCELLANEOUS) ×1 IMPLANT
DRAPE SURG 17X23 STRL (DRAPES) ×1 IMPLANT
DRAPE U-SHAPE 47X51 STRL (DRAPES) ×1 IMPLANT
DRSG MEPILEX POST OP 4X12 (GAUZE/BANDAGES/DRESSINGS) IMPLANT
DRSG MEPILEX SACRM 8.7X9.8 (GAUZE/BANDAGES/DRESSINGS) ×1 IMPLANT
ELECT REM PT RETURN 9FT ADLT (ELECTROSURGICAL) ×1
ELECTRODE REM PT RTRN 9FT ADLT (ELECTROSURGICAL) ×1 IMPLANT
GAUZE SPONGE 4X4 12PLY STRL (GAUZE/BANDAGES/DRESSINGS) IMPLANT
GLOVE BIO SURGEON STRL SZ8 (GLOVE) ×4 IMPLANT
GLOVE BIOGEL PI IND STRL 7.0 (GLOVE) ×2 IMPLANT
GLOVE SRG 8 PF TXTR STRL LF DI (GLOVE) ×1 IMPLANT
GLOVE SURG UNDER POLY LF SZ8 (GLOVE) ×1
GOWN STRL REUS W/TWL LRG LVL3 (GOWN DISPOSABLE) ×3 IMPLANT
HANDPIECE INTERPULSE COAX TIP (DISPOSABLE) ×1
HEAD BIPOLAR PROS AML 45 (Hips) IMPLANT
HIP BALL ARTICU 28 +5 (Hips) ×1 IMPLANT
INST SET MAJOR BONE (KITS) ×1 IMPLANT
IV NS IRRIG 3000ML ARTHROMATIC (IV SOLUTION) ×1 IMPLANT
KIT BLADEGUARD II DBL (SET/KITS/TRAYS/PACK) ×1 IMPLANT
KIT TURNOVER KIT A (KITS) ×1 IMPLANT
MANIFOLD NEPTUNE II (INSTRUMENTS) ×1 IMPLANT
MARKER SKIN DUAL TIP RULER LAB (MISCELLANEOUS) ×1 IMPLANT
NDL HYPO 21X1.5 SAFETY (NEEDLE) ×1 IMPLANT
NEEDLE HYPO 21X1.5 SAFETY (NEEDLE) ×1 IMPLANT
NS IRRIG 1000ML POUR BTL (IV SOLUTION) ×1 IMPLANT
PACK SURGICAL SETUP 50X90 (CUSTOM PROCEDURE TRAY) ×1 IMPLANT
PACK TOTAL JOINT (CUSTOM PROCEDURE TRAY) ×1 IMPLANT
PAD ARMBOARD 7.5X6 YLW CONV (MISCELLANEOUS) ×1 IMPLANT
PENCIL HANDSWITCHING (ELECTRODE) IMPLANT
POSITIONER HEAD 8X9X4 ADT (SOFTGOODS) ×1 IMPLANT
SET BASIN LINEN APH (SET/KITS/TRAYS/PACK) ×1 IMPLANT
SET HNDPC FAN SPRY TIP SCT (DISPOSABLE) ×1 IMPLANT
STEM FEM ACTIS HIGH SZ3 (Stem) IMPLANT
STRIP CLOSURE SKIN 1/2X4 (GAUZE/BANDAGES/DRESSINGS) ×2 IMPLANT
SUT MNCRL AB 4-0 PS2 18 (SUTURE) ×2 IMPLANT
SUT MON AB 2-0 CT1 36 (SUTURE) ×1 IMPLANT
SUT VIC AB 1 CT1 27 (SUTURE) ×5
SUT VIC AB 1 CT1 27XBRD ANTBC (SUTURE) ×4 IMPLANT
SYR 30ML LL (SYRINGE) ×2 IMPLANT
TRAY FOLEY MTR SLVR 16FR STAT (SET/KITS/TRAYS/PACK) ×1 IMPLANT
WATER STERILE IRR 1000ML POUR (IV SOLUTION) ×2 IMPLANT
YANKAUER SUCT 12FT TUBE ARGYLE (SUCTIONS) ×1 IMPLANT

## 2022-12-29 NOTE — Transfer of Care (Signed)
Immediate Anesthesia Transfer of Care Note  Patient: Beth Young  Procedure(s) Performed: ARTHROPLASTY BIPOLAR HIP (HEMIARTHROPLASTY) (Right: Hip)  Patient Location: PACU  Anesthesia Type:General  Level of Consciousness: drowsy and patient cooperative  Airway & Oxygen Therapy: Patient Spontanous Breathing and Patient connected to nasal cannula oxygen  Post-op Assessment: Report given to RN and Post -op Vital signs reviewed and stable  Post vital signs: Reviewed and stable  Last Vitals:  Vitals Value Taken Time  BP 106/46 12/29/22 1456  Temp 98.5 F 12/29/22   1456  Pulse 78 12/29/22 1457  Resp 11 12/29/22 1457  SpO2 96 % 12/29/22 1457  Vitals shown include unfiled device data.  Last Pain:  Vitals:   12/29/22 1132  TempSrc:   PainSc: Asleep      Patients Stated Pain Goal: 5 (12/29/22 1109)  Complications: No notable events documented.

## 2022-12-29 NOTE — Anesthesia Procedure Notes (Signed)
Procedure Name: Intubation Date/Time: 12/29/2022 12:26 PM  Performed by: Oletha Cruel, CRNAPre-anesthesia Checklist: Patient identified, Emergency Drugs available, Suction available and Patient being monitored Patient Re-evaluated:Patient Re-evaluated prior to induction Oxygen Delivery Method: Circle system utilized Preoxygenation: Pre-oxygenation with 100% oxygen Induction Type: IV induction Ventilation: Mask ventilation without difficulty Laryngoscope Size: Mac and 3 Grade View: Grade I Tube type: Oral Tube size: 6.5 mm Number of attempts: 1 Airway Equipment and Method: Stylet Placement Confirmation: ETT inserted through vocal cords under direct vision, positive ETCO2, breath sounds checked- equal and bilateral and CO2 detector Secured at: 19 (OETT positioned 19 cm at lower lip.) cm Tube secured with: Tape Dental Injury: Teeth and Oropharynx as per pre-operative assessment  Comments: Atraumatic intubation. Breath sounds clear and equal bilaterally. Patient edentulous. Lips remain in preoperative condition.

## 2022-12-29 NOTE — Anesthesia Postprocedure Evaluation (Signed)
Anesthesia Post Note  Patient: Beth Young  Procedure(s) Performed: ARTHROPLASTY BIPOLAR HIP (HEMIARTHROPLASTY) (Right: Hip)  Patient location during evaluation: PACU Anesthesia Type: General Level of consciousness: awake and alert Pain management: pain level controlled Vital Signs Assessment: post-procedure vital signs reviewed and stable Respiratory status: spontaneous breathing, nonlabored ventilation, respiratory function stable and patient connected to nasal cannula oxygen Cardiovascular status: blood pressure returned to baseline and stable Postop Assessment: no apparent nausea or vomiting Anesthetic complications: no   There were no known notable events for this encounter.   Last Vitals:  Vitals:   12/29/22 1515 12/29/22 1531  BP: (!) 119/50 (!) 110/53  Pulse: 77 73  Resp: 11 (!) 9  Temp:    SpO2: 94% 96%    Last Pain:  Vitals:   12/29/22 1500  TempSrc:   PainSc: Asleep                 Maygen Sirico L Masae Lukacs

## 2022-12-29 NOTE — Consult Note (Signed)
ORTHOPAEDIC CONSULTATION  REQUESTING PHYSICIAN: Shon Hale, MD  ASSESSMENT AND PLAN: 77 y.o. female with the following: Right Hip Displaced femoral neck fracture  This patient requires inpatient admission to the hospitalist, to include preoperative clearance and perioperative medical management  - Weight Bearing Status/Activity: NWB Right lower extremity  - Additional recommended labs/tests: Preop Labs: CBC, BMP, PT/INR, Chest XR, and EKG  -VTE Prophylaxis: Please hold prior to OR; to resume POD#1 at the discretion of the primary team  - Pain control: Recommend PO pain medications PRN; judicious use of narcotics  - Follow-up plan: F/u 10-14 days postop  -Procedures: Plan for OR once patient has been medically optimized; NPO since midnight.  Surgery today  Plan for Right Hip hemiarthroplasty     Chief Complaint: Right hip pain  HPI: Beth Young is a 77 y.o. female who presented to the ED for evaluation after sustaining a mechanical fall. She lives in an assisted living facility.  She uses a cane or a walker, some of the time.  She has a brain stimulator for tremors.  She states the stimulator will cause her to walk quickly.  She was in her apartment and was moving too fast when she lost her balance.  She fell and had immediate right hip pain.  She is also complaining of right wrist pain.  She denies numbness and tingling.  Pain is worse with movement.  She is comfortable when she is laying still.  She takes Plavix, last dose was greater than 24 hours ago.  Past Medical History:  Diagnosis Date   Anxiety    Arthritis    Asthma    Depression    Essential tremor    with implantable brain stimulator device   GERD (gastroesophageal reflux disease)    History of stroke    Hyperlipidemia    Hypertension    Insomnia    Osteoporosis    Parkinson's disease with use of electrical brain stimulation (HCC)    Restless legs syndrome    Type 2 diabetes mellitus (HCC)     Past Surgical History:  Procedure Laterality Date   ABDOMINAL HYSTERECTOMY     ANKLE SURGERY Right    BIOPSY  05/04/2017   Procedure: BIOPSY;  Surgeon: Corbin Ade, MD;  Location: [facility];  Service: Endoscopy;;  ascending, descending, sigmoid bx's   BURR HOLE W/ STEREOTACTIC INSERTION OF DBS LEADS / INTRAOP MICROELECTRODE RECORDING  04/03/2012   CATARACT EXTRACTION, BILATERAL     COLONOSCOPY WITH PROPOFOL N/A 05/04/2017   Dr. Jena Gauss: diverticulosis, random colon biopsies were negative.    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   HAND SURGERY Bilateral    joint replacement, bilateral thumbs   Social History   Socioeconomic History   Marital status: Single    Spouse name: Not on file   Number of children: Not on file   Years of education: Not on file   Highest education level: Not on file  Occupational History   Occupation: retired    Comment: build Dealer for phones  Tobacco Use   Smoking status: Former    Current packs/day: 0.00    Average packs/day: 0.5 packs/day for 5.0 years (2.5 ttl pk-yrs)    Types: Cigarettes    Start date: 03/11/2007    Quit date: 03/10/2012    Years since quitting: 10.8   Smokeless  tobacco: Current    Types: Snuff   Tobacco comments:    Snuff for about 10 years  Vaping Use   Vaping status: Never Used  Substance and Sexual Activity   Alcohol use: No    Alcohol/week: 0.0 standard drinks of alcohol   Drug use: No   Sexual activity: Not Currently    Birth control/protection: Surgical  Other Topics Concern   Not on file  Social History Narrative   Not on file   Social Determinants of Health   Financial Resource Strain: Not on file  Food Insecurity: No Food Insecurity (12/28/2022)   Hunger Vital Sign    Worried About Running Out of Food in the Last Year: Never true    Ran Out of Food in the Last Year: Never true  Transportation Needs: No  Transportation Needs (12/28/2022)   PRAPARE - Administrator, Civil Service (Medical): No    Lack of Transportation (Non-Medical): No  Physical Activity: Not on file  Stress: Not on file  Social Connections: Not on file   Family History  Problem Relation Age of Onset   Dementia Mother    Heart attack Father    Breast cancer Sister    Other Son        ???celiac   Colon cancer Neg Hx    Gastric cancer Neg Hx    Esophageal cancer Neg Hx    Allergies  Allergen Reactions   Bupropion Other (See Comments)   Codeine Other (See Comments)    Jittery   Tape Other (See Comments)    Uncoded Allergy. Allergen: BANDAIDS   Trichlormethiazide Other (See Comments)   Penicillins Rash   Silver Rash    Patient has moderate blistering with tegaderm use   Sulfa Antibiotics Itching   Prior to Admission medications   Medication Sig Start Date End Date Taking? Authorizing Provider  albuterol (PROVENTIL HFA;VENTOLIN HFA) 108 (90 Base) MCG/ACT inhaler Inhale 1 puff into the lungs every 6 (six) hours as needed for wheezing or shortness of breath.   Yes [provider]  alendronate (FOSAMAX) 70 MG tablet Take 70 mg by mouth once a week. 12/15/22  Yes [provider]  atorvastatin (LIPITOR) 80 MG tablet Take 80 mg by mouth daily.  04/17/16 12/28/22 Yes [provider]  carbidopa-levodopa (SINEMET IR) 25-100 MG tablet Take 1 tablet by mouth 3 (three) times daily.   Yes [provider]  clopidogrel (PLAVIX) 75 MG tablet Take 75 mg by mouth daily.    Yes [provider]  dicyclomine (BENTYL) 10 MG capsule Take 1 capsule (10 mg total) by mouth 2 (two) times daily as needed (diarrhea). Patient taking differently: Take 10 mg by mouth 3 (three) times daily as needed for spasms (diarrhea). 11/12/18  Yes Tiffany Kocher, PA-C  doxepin (SINEQUAN) 25 MG capsule Take 25 mg by mouth at bedtime.    Yes [provider]  escitalopram (LEXAPRO) 20 MG tablet Take  20 mg by mouth daily. 07/03/17  Yes [provider]  ferrous sulfate 325 (65 FE) MG tablet Take 325 mg by mouth daily.   Yes [provider]  fluticasone (FLONASE) 50 MCG/ACT nasal spray Place 2 sprays into both nostrils daily as needed for allergies or rhinitis.   Yes [provider]  glipiZIDE (GLUCOTROL XL) 5 MG 24 hr tablet Take 5 mg by mouth every morning. 12/15/22  Yes [provider]  lisinopril (ZESTRIL) 20 MG tablet Take 20 mg by mouth  daily.  09/24/18  Yes [provider]  montelukast (SINGULAIR) 10 MG tablet Take 10 mg by mouth daily.  07/26/13  Yes [provider]  omeprazole (PRILOSEC) 20 MG capsule Take 20 mg by mouth daily. TAKE 1 CAPSULE (20 MG TOTAL) BY MOUTH DAILY. 07/13/13  Yes [provider]  propranolol ER (INDERAL LA) 80 MG 24 hr capsule Take 80 mg by mouth daily. For hypertension   Yes [provider]  topiramate (TOPAMAX) 50 MG tablet Take 50 mg by mouth at bedtime.    Yes [provider]   DG Chest 1 View  Result Date: 12/28/2022 CLINICAL DATA:  Fall EXAM: CHEST  1 VIEW COMPARISON:  None Available. FINDINGS: Lungs volumes are small, but are symmetric and are clear. No pneumothorax or pleural effusion. Cardiac size within normal limits. Pulmonary vascularity is normal. Osseous structures are age-appropriate. No acute bone abnormality. IMPRESSION: 1. Pulmonary hypoinflation. Electronically Signed   By: Helyn Numbers M.D.   On: 12/28/2022 19:03   DG Hip Unilat With Pelvis 2-3 Views Right  Result Date: 12/28/2022 CLINICAL DATA:  Fall, right leg deformed EXAM: DG HIP (WITH OR WITHOUT PELVIS) 2-3V RIGHT COMPARISON:  None Available. FINDINGS: There is an acute right subcapital femoral neck fracture with override and external rotation of the distal fracture fragment with resultant moderate varus angulation. Femoral head is still seated within the right acetabulum. Hip joint spaces are preserved. Visualized  pelvis and left hip are intact. Neurostimulator device overlies the right iliac wing. Soft tissues are unremarkable. IMPRESSION: 1. Acute right subcapital femoral neck fracture with override and varus angulation. Electronically Signed   By: Helyn Numbers M.D.   On: 12/28/2022 19:03    Family History Reviewed and non-contributory, no pertinent history of problems with bleeding or anesthesia    Review of Systems No fevers or chills No numbness or tingling No chest pain No shortness of breath No bowel or bladder dysfunction No GI distress No headaches    OBJECTIVE  Vitals:Patient Vitals for the past 8 hrs:  BP Temp Temp src Pulse Resp SpO2  12/29/22 0415 (!) 140/54 98.1 F (36.7 C) Oral 76 16 100 %  12/29/22 0148 133/67 98.1 F (36.7 C) Oral 79 18 99 %   General: Alert, no acute distress Cardiovascular: Extremities are warm Respiratory: No cyanosis, no use of accessory musculature Skin: No lesions in the area of chief complaint  Neurologic: Sensation intact distally  Psychiatric: Patient is competent for consent with normal mood and affect Lymphatic: No swelling obvious and reported other than the area involved in the exam below Extremities  RLE: Extremity held in a fixed position.  ROM deferred due to known fracture.  Sensation is intact distally in the sural, saphenous, DP, SP, and plantar nerve distribution. 2+ DP pulse.  Toes are WWP.  Active motion intact in the TA/EHL/GS. LLE: Sensation is intact distally in the sural, saphenous, DP, SP, and plantar nerve distribution. 2+ DP pulse.  Toes are WWP.  Active motion intact in the TA/EHL/GS. Tolerates gentle ROM of the hip.  No pain with axial loading.    Right hand without obvious deformity.  There is some swelling and redness ulnar.  No bruising.  This area is tender to palpation.  She is able to range her wrist and fingers, but she is guarded.  Fingers are warm and well perfused.   Test Results Imaging XR of the Right hip  demonstrates a Displaced femoral neck fracture.  Stimulator visible in  right lower quadrant.   Labs cbc Recent Labs    12/28/22 1722 12/29/22 0442  WBC 12.8* 11.5*  HGB 12.0 11.5*  HCT 38.6 37.1  PLT 220 201    Labs coag Recent Labs    12/28/22 1722  INR 1.1    Recent Labs    12/28/22 1722 12/29/22 0442  NA 136 136  K 4.2 4.2  CL 108 104  CO2 20* 24  GLUCOSE 155* 192*  BUN 16 19  CREATININE 1.15* 1.08*  CALCIUM 8.6* 8.7*

## 2022-12-29 NOTE — NC FL2 (Deleted)
[place] MEDICAID FL2 LEVEL OF CARE FORM     IDENTIFICATION  Patient Name: Beth Young Birthdate: 1945-09-10 Sex: female Admission Date (Current Location): 12/28/2022  [facility] and [place] Number:  [employer] and Address:  [facility],  [address], [place] [address]      Provider Number: [id number]  Attending Physician Name and Address:  Shon Hale, MD  Relative Name and Phone Number:       Current Level of Care: Hospital Recommended Level of Care: Skilled Nursing Facility Prior Approval Number:    Date Approved/Denied:   PASRR Number: [id number]  Discharge Plan: SNF    Current Diagnoses: Patient Active Problem List   Diagnosis Date Noted   Closed displaced fracture of right femoral neck (HCC) 12/28/2022   Essential hypertension 12/28/2022   Type 2 diabetes mellitus with hyperglycemia (HCC) 12/28/2022   Asthma, chronic 12/28/2022   Depression 12/28/2022   Parkinson disease (HCC) 12/28/2022   Dehydration 12/28/2022   DDD (degenerative disc disease), cervical 05/31/2019   Facet arthropathy, lumbar 05/31/2019   Chronic bilateral low back pain without sciatica 05/31/2019   Neurogenic claudication 05/31/2019   Hereditary ataxia, 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   Anemia 11/12/2018   Low back pain 10/20/2018   Fecal incontinence 04/14/2017   Diarrhea 12/11/2016   Status post deep brain stimulator placement 07/16/2016   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) 04/24/2016   Insomnia due to medical condition 04/16/2016   Osteoporosis 04/16/2016   Essential tremor 04/17/2014   S/P deep brain stimulator placement 04/17/2014   Falls frequently 10/10/2013   Ataxia 08/24/2013   Restless leg 01/15/2012   Diabetes mellitus, type 2 (HCC) 06/02/2011   CAFL (chronic airflow limitation) (HCC) 10/02/2010   GERD without esophagitis 10/02/2010   Mixed hyperlipidemia 10/02/2010   Osteoporosis, 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     Orientation RESPIRATION BLADDER Height & Weight     Self, Time, Situation, Place  Normal Incontinent Weight: 60.3 kg Height:  5\' 1"  (154.9 cm)  BEHAVIORAL SYMPTOMS/MOOD NEUROLOGICAL BOWEL NUTRITION STATUS      Continent Diet  AMBULATORY STATUS COMMUNICATION OF NEEDS Skin   Extensive Assist Verbally Normal                       Personal Care Assistance Level of Assistance  Bathing, Feeding, Dressing Bathing Assistance: Limited assistance Feeding assistance: Independent Dressing Assistance: Limited assistance     Functional Limitations Info  Sight, Hearing, Speech Sight Info: Impaired Hearing Info: Adequate Speech Info: Adequate    SPECIAL CARE FACTORS FREQUENCY  PT (By licensed PT), OT (By licensed OT)     PT Frequency: 5x/weekly OT Frequency: 5x/weekly            Contractures Contractures Info: Not present    Additional Factors Info  Allergies, Code Status Code Status Info: Full Allergies Info: Bupropion, Codeine, Tape,  BANDAIDS,  Trichlormethiazide, Penicillins, Silver  Patient has moderate blistering with tegaderm use, Sulfa Antibiotics           Current Medications (12/29/2022):  This is the current hospital active medication list Current Facility-Administered Medications  Medication Dose Route Frequency Provider Last Rate Last Admin   0.9 % irrigation (POUR BTL)    PRN Oliver Barre, MD   1,000 mL at 12/29/22 1311   [MAR Hold] acetaminophen (TYLENOL) tablet 650 mg  650 mg Oral Q6H PRN Adefeso, Oladapo, DO   650 mg at 12/29/22 0508   Or   [facility]  Hold] acetaminophen (TYLENOL) suppository 650 mg  650 mg Rectal Q6H PRN Adefeso, Oladapo, DO       bupivacaine-epinephrine (PF) (MARCAINE W/ EPI) 0.5% -1:200000 injection    PRN Oliver Barre, MD   30 mL at 12/29/22 1437   [MAR Hold] carbidopa-levodopa (SINEMET IR) 25-100 MG per tablet immediate release 1 tablet  1 tablet Oral TID Frankey Shown, DO   1 tablet at 12/28/22 2338   [MAR Hold] escitalopram  (LEXAPRO) tablet 20 mg  20 mg Oral Daily Adefeso, Oladapo, DO   20 mg at 12/29/22 0850   [MAR Hold] fentaNYL (SUBLIMAZE) injection 50 mcg  50 mcg Intravenous Q2H PRN Emokpae, Courage, MD       fentaNYL (SUBLIMAZE) injection 50 mcg  50 mcg Intravenous Q15 min PRN Ronelle Nigh, MD   50 mcg at 12/29/22 1110   fentaNYL (SUBLIMAZE) injection 50 mcg  50 mcg Intravenous Q15 min PRN Ronelle Nigh, MD       Mitzi Hansen Hold] hydrALAZINE (APRESOLINE) injection 10 mg  10 mg Intravenous Q6H PRN Emokpae, Courage, MD       HYDROmorphone (DILAUDID) injection 0.25-0.5 mg  0.25-0.5 mg Intravenous Q5 min PRN Ronelle Nigh, MD       Mitzi Hansen Hold] insulin aspart (novoLOG) injection 0-9 Units  0-9 Units Subcutaneous Q4H Adefeso, Oladapo, DO   2 Units at 12/29/22 0927   [MAR Hold] lisinopril (ZESTRIL) tablet 20 mg  20 mg Oral Daily Adefeso, Oladapo, DO   20 mg at 12/29/22 0851   [MAR Hold] montelukast (SINGULAIR) tablet 10 mg  10 mg Oral Daily Adefeso, Oladapo, DO   10 mg at 12/29/22 0851   ondansetron (ZOFRAN) injection 4 mg  4 mg Intravenous Once PRN Ronelle Nigh, MD       Mitzi Hansen Hold] pantoprazole (PROTONIX) EC tablet 40 mg  40 mg Oral Daily Adefeso, Oladapo, DO   40 mg at 12/29/22 0851   [MAR Hold] prochlorperazine (COMPAZINE) injection 10 mg  10 mg Intravenous Q6H PRN Adefeso, Oladapo, DO       [MAR Hold] propranolol ER (INDERAL LA) 24 hr capsule 80 mg  80 mg Oral Daily Adefeso, Oladapo, DO       scopolamine (TRANSDERM-SCOP) 1 MG/3DAYS 1.5 mg  1 patch Transdermal Once Ronelle Nigh, MD   1.5 mg at 12/29/22 1116   sodium chloride irrigation 0.9 %    PRN Oliver Barre, MD   3,000 mL at 12/29/22 1311   sterile water for irrigation for irrigation    PRN Oliver Barre, MD   1,000 mL at 12/29/22 1312   vancomycin (VANCOCIN) powder    PRN Oliver Barre, MD   1,000 mg at 12/29/22 1409     Discharge Medications: Please see discharge summary for a list of discharge medications.  Relevant Imaging Results:  Relevant Lab  Results:   Additional Information SSN [id number]  [place] A Josephanthony Tindel, RN

## 2022-12-29 NOTE — Op Note (Signed)
Orthopaedic Surgery Operative Note (CSN: [id number])  Beth Young  30-May-1945 Date of Surgery: 12/29/2022   Diagnoses:  Right displaced femoral neck fracture  Procedure: Right hip hemiarthroplasty   Operative Finding Successful completion of the planned procedure.  Placement of right hip hemiarthroplasty with high offset stem.  Hip stable to 90 degrees flexion, 45 degrees IR.  Leg lengths equal   Post-Op Diagnosis: Same Surgeons:Primary: Oliver Barre, MD Assistants: Cecile Sheerer Location: [facility] OR ROOM 4 Anesthesia: General with local anesthesia Antibiotics: Ancef 2 g with local vancomycin powder 1 g at the surgical site Tourniquet time: N/A Estimated Blood Loss: 300 cc Complications: None Specimens: None  Implants: Implant Name Type Inv. Item Serial No. Manufacturer Lot No. LRB No. Used Action  BIPOLAR PROS AML 45 - [id number] Hips BIPOLAR PROS AML 45  [employer]PU[employer] ORTH[employer] [id number] Right 1 Implanted  ACTIS DUOFIX HIP PROSTHESIS FEMORAL STEM  12/14 TAPER CEMENTLESS SIZE 3 HIGH COLLAR Stem    [id number] Right 1 Implanted  HIP BALL ARTICU 28 +5 - [id number] Hips HIP BALL ARTICU 28 +5  DEPUY ORTH[employer] [id number] Right 1 Implanted    Indications for Surgery:   Beth Young is a 77 y.o. female who sustained a mechanical fall and has a right displaced femoral neck fracture.  In order to restore form and function, I recommended we proceed with surgery to include a hip hemiarthroplasty.  Benefits and risks of operative and nonoperative management were discussed prior to surgery with the patient and informed consent form was completed.  Specific risks including infection, need for additional surgery, bleeding, persistent pain, stiffness, malunion, blood clots and more severe complications associated with anesthesia were discussed.  She elected to proceed.  Surgical consent was finalized.    Procedure:   The patient was identified properly. Informed consent was obtained and the surgical  site was marked. The patient was taken up to suite where general anesthesia was induced.  The patient was positioned lateral, with the right side up.  The right leg was prepped and draped in the usual sterile fashion.  Timeout was performed before the beginning of the case.  Ancef dosing was confirmed prior to incision.  Patient received 1 g of TXA before the start of the case  We started by making an incision centered over the greater trochanter with a scalpel. We used the scalpel to continue to dissect to the fascia.  Electrocautery was used to help with hemostasis.  A cobb elevator was used to clear the IT band and then it was pierced with Bovie electrocautery. Mayo scissors were used to cut the fascia in a longitudinal fashion. The gluteus maximus fibers were bluntly split in line with their fibers.   The femur was slowly internally rotated, putting tension on the posterior structures. The short external rotators were identified and tagged with #1 vicryl.  Bovie electrocautery was used to dissect the short external rotators off of the insertion onto the femur.  Next, we identified the capsule and made a T capsulotomy.  The capsule was then tagged with #1 Vicryl sutures.   Next, we visualized the femoral neck fracture. We identified the sciatic nerve by palpation and verified that it was not in danger during the dissection.    We then made a neck cut approximately 1 cm above the lesser trochanter with a saw. We removed the femoral head. We used the box cutter to cut away some of the greater trochanter for ease of insertion of the stem. We  then used the canal finder to locate the femoral canal.   Using the angle of the femoral neck as our guide for version, we broached sequentially. We trialed components and found appropriate fit and stability.  The patient's bone was stable enough to get an excellent pressfit with the stem.  We placed an appropriately sized head on the stem.  The hip was reduced and we  noted full extension of the hip. The hip was stable at 90 degrees of flexion, and 45 degrees of internal rotation.  Leg lengths were approximately equal.  We then removed the trials as well as the broach. We ensured there were no foreign bodies or bone within the acetabulum. We copiously irrigated the wound.    The appropriately sized stem was then opened and this was pressfit within the canal.  We achieved excellent fit and the stem was stable.  An appropriately sized head was placed on to the stem and the hip was reduced. Again, we noted it was stable in the previously mentioned manipulations.   We repaired the posterior capsule.  Short external rotators repaired to the greater trochanter to bone tunnels. We closed the fascia of the iliotibial band and gluteus maximus with running and intterupted Vicryl sutures. We then closed Scarpa's fascia with Vicryl sutures. Skin was closed with 2-0 monocryl and 4-0 Monocryl with Steri-Strips and an Aquacel dressing was placed.  A hib abduction pillow was secured.  Patient was awoken taken to PACU in stable condition.   Post-operative plan:  The patient will be WBAT on the operative extremity. Hip abduction pillow in place at all times when in bed Posterior hip precautions PT/OT    DVT prophylaxis: SCDs, early ambulation and patient can resume Plavix starting POD#1. Pain control with PRN pain medication preferring oral medicines.   Follow up plan will be scheduled in approximately 14 days for incision check and XR.

## 2022-12-29 NOTE — Anesthesia Preprocedure Evaluation (Addendum)
Anesthesia Evaluation  Patient identified by MRN, date of birth, ID band Patient awake    History of Anesthesia Complications (+) history of anesthetic complications  Airway Mallampati: I       Dental  (+) Teeth Intact   Pulmonary asthma , former smoker   Pulmonary exam normal breath sounds clear to auscultation       Cardiovascular hypertension, Pt. on medications and Pt. on home beta blockers Normal cardiovascular exam Rhythm:Regular Rate:Normal  29-Apr-2017 14:40:49 [facility] ROUTINE RECORD Sinus rhythm   Neuro/Psych  PSYCHIATRIC DISORDERS Anxiety Depression    Deep brain stimulator for Parkinsons disease.  Hereditary ataxia.  CVA with hemiplegia  Neuromuscular disease    GI/Hepatic Neg liver ROS,GERD  Medicated and Controlled,,  Endo/Other  diabetes, Well Controlled, Type 2    Renal/GU negative Renal ROS  negative genitourinary   Musculoskeletal  (+) Arthritis , Osteoarthritis,    Abdominal Normal abdominal exam  (+)   Peds  Hematology negative hematology ROS (+)   Anesthesia Other Findings   Reproductive/Obstetrics                             Anesthesia Physical Anesthesia Plan  ASA: 3  Anesthesia Plan: General   Post-op Pain Management: Dilaudid IV   Induction: Intravenous  PONV Risk Score and Plan: Ondansetron, Dexamethasone, Midazolam, Scopolamine patch - Pre-op and Propofol infusion  Airway Management Planned: Nasal Cannula, Natural Airway and Oral ETT  Additional Equipment: None  Intra-op Plan:   Post-operative Plan:   Informed Consent: I have reviewed the patients History and Physical, chart, labs and discussed the procedure including the risks, benefits and alternatives for the proposed anesthesia with the patient or authorized representative who has indicated his/her understanding and acceptance.     Dental advisory given  Plan Discussed  with: CRNA  Anesthesia Plan Comments:         Anesthesia Quick Evaluation

## 2022-12-29 NOTE — Progress Notes (Signed)
PROGRESS NOTE  Beth Young, is a 77 y.o. female, DOB - 06-02-1945, VFI:[id number]  Admit date - 12/28/2022   Admitting Physician Frankey Shown, DO  Outpatient Primary MD for the patient is No primary care provider on file.  LOS - 1  Chief Complaint  Patient presents with   Fall      Brief Narrative:  77 y.o. female with medical history significant of hypertension, hyperlipidemia, Parkinson's disease with use of electrical brain stimulation, depression, type 2 diabetes mellitus, GERD, asthma admitted on 12/28/2022 with right hip fracture after mechanical fall    -Assessment and Plan: 1)Rt Hip Closed Fx--- acute right subcapital femoral neck fracture with override and varus angulation -Orthopedic consult appreciated -Plan for ORIF on 12/29/2022 -Further management per orthopedic team -Increase IV fentanyl to 50 mg every 2 as needed  2)DM2- Use Novolog/Humalog Sliding scale insulin with Accu-Cheks/Fingersticks as ordered  - 3)Leukocytosis--suspect this is reactive in the setting of fall/trauma with right hip fracture -WBC 12.8 >>11.5  4) chronic anemia--- anticipate drop in H&H due to IV fluids/hemodilution and perioperative blood loss -Hemoglobin currently greater than 11 which is close to prior baseline as noted back in 2020 and  2019  5)CKD stage -3A -Renal function appears to be close to baseline - renally adjust medications, avoid nephrotoxic agents / dehydration  / hypotension  6)HTN--stable, continue lisinopril and propranolol -IV hydralazine as needed elevated BP  7) Parkinson's disease--- patient has a neurostimulator to help her Parkinson's symptoms -Continue Sinemet, increase propranolol to help tremors  8)GERD--stable, continue Protonix  9) depression/anxiety--stable, continue Lexapro  10)HLD--hold Lipitor given fall and trauma--high risk for elevated CKs   Status is: Inpatient   Disposition: The patient is from: Home              Anticipated d/c is  to: SNF              Anticipated d/c date is: 2 days              Patient currently is not medically stable to d/c. Barriers: Not Clinically Stable-   Code Status :  -  Code Status: Full Code   Family Communication:   (patient is alert, awake and coherent)  Discussed with Son Luan Pulling at bedside  DVT Prophylaxis  :   - SCDs  SCDs Start: 12/28/22 2055   Lab Results  Component Value Date   PLT 201 12/29/2022   Inpatient Medications  Scheduled Meds:  atorvastatin  80 mg Oral Daily   carbidopa-levodopa  1 tablet Oral TID   escitalopram  20 mg Oral Daily   insulin aspart  0-9 Units Subcutaneous Q4H   lisinopril  20 mg Oral Daily   montelukast  10 mg Oral Daily   pantoprazole  40 mg Oral Daily   propranolol ER  80 mg Oral Daily   Continuous Infusions:   ceFAZolin (ANCEF) IV     lactated ringers 75 mL/hr at 12/29/22 4166   tranexamic acid     PRN Meds:.acetaminophen **OR** acetaminophen, fentaNYL (SUBLIMAZE) injection, prochlorperazine   Anti-infectives (From admission, onward)    Start     Dose/Rate Route Frequency Ordered Stop   12/29/22 1200  ceFAZolin (ANCEF) IVPB 2g/100 mL premix        2 g 200 mL/hr over 30 Minutes Intravenous On call to O.R. 12/28/22 1805 12/30/22 0559      Subjective: [name] today has no fevers, no emesis,  No chest pain,    Son Cristal Deer  Reed is at bedside, questions answered - Patient complains of right leg/hip pain - Objective: Vitals:   12/28/22 2121 12/29/22 0148 12/29/22 0415 12/29/22 0851  BP: 137/69 133/67 (!) 140/54 (!) 143/56  Pulse: 69 79 76   Resp: 16 18 16    Temp: 97.6 F (36.4 C) 98.1 F (36.7 C) 98.1 F (36.7 C)   TempSrc: Oral Oral Oral   SpO2: 100% 99% 100%   Weight:      Height:        Intake/Output Summary (Last 24 hours) at 12/29/2022 1010 Last data filed at 12/29/2022 0655 Gross per 24 hour  Intake 625.43 ml  Output --  Net 625.43 ml   Filed Weights   12/28/22 1639  Weight: 60.3 kg     Physical Exam  Gen:- Awake Alert,  in no apparent distress  HEENT:- [place].AT, No sclera icterus, neurostimulator wires on the right side and frontal area of scalp Neck-Supple Neck,No JVD,.  Lungs-  CTAB , fair symmetrical air movement CV- S1, S2 normal, regular  Abd-  +ve B.Sounds, Abd Soft, No tenderness, neurostimulator in the right lower quadrant    Extremity/Skin:- No  edema, pedal pulses present  Psych-affect is appropriate, oriented x3 Neuro-no new focal deficits, no tremors MSK--Right lower extremity is shortened and rotated  Data Reviewed: I have personally reviewed following labs and imaging studies  CBC: Recent Labs  Lab 12/28/22 1722 12/29/22 0442  WBC 12.8* 11.5*  NEUTROABS 10.2*  --   HGB 12.0 11.5*  HCT 38.6 37.1  MCV 89.6 90.3  PLT 220 201   Basic Metabolic Panel: Recent Labs  Lab 12/28/22 1722 12/29/22 0442  NA 136 136  K 4.2 4.2  CL 108 104  CO2 20* 24  GLUCOSE 155* 192*  BUN 16 19  CREATININE 1.15* 1.08*  CALCIUM 8.6* 8.7*  MG  --  2.1  PHOS  --  3.3   GFR: Estimated Creatinine Clearance: 37.5 mL/min (A) (by C-G formula based on SCr of 1.08 mg/dL (H)). Liver Function Tests: Recent Labs  Lab 12/29/22 0442  AST 49*  ALT 23  ALKPHOS 58  BILITOT 0.4  PROT 6.6  ALBUMIN 3.3*   Radiology Studies: DG Chest 1 View  Result Date: 12/28/2022 CLINICAL DATA:  Fall EXAM: CHEST  1 VIEW COMPARISON:  None Available. FINDINGS: Lungs volumes are small, but are symmetric and are clear. No pneumothorax or pleural effusion. Cardiac size within normal limits. Pulmonary vascularity is normal. Osseous structures are age-appropriate. No acute bone abnormality. IMPRESSION: 1. Pulmonary hypoinflation. Electronically Signed   By: Helyn Numbers M.D.   On: 12/28/2022 19:03   DG Hip Unilat With Pelvis 2-3 Views Right  Result Date: 12/28/2022 CLINICAL DATA:  Fall, right leg deformed EXAM: DG HIP (WITH OR WITHOUT PELVIS) 2-3V RIGHT COMPARISON:  None Available.  FINDINGS: There is an acute right subcapital femoral neck fracture with override and external rotation of the distal fracture fragment with resultant moderate varus angulation. Femoral head is still seated within the right acetabulum. Hip joint spaces are preserved. Visualized pelvis and left hip are intact. Neurostimulator device overlies the right iliac wing. Soft tissues are unremarkable. IMPRESSION: 1. Acute right subcapital femoral neck fracture with override and varus angulation. Electronically Signed   By: Helyn Numbers M.D.   On: 12/28/2022 19:03    Scheduled Meds:  atorvastatin  80 mg Oral Daily   carbidopa-levodopa  1 tablet Oral TID   escitalopram  20 mg Oral Daily   insulin aspart  0-9 Units Subcutaneous Q4H   lisinopril  20 mg Oral Daily   montelukast  10 mg Oral Daily   pantoprazole  40 mg Oral Daily   propranolol ER  80 mg Oral Daily   Continuous Infusions:   ceFAZolin (ANCEF) IV     lactated ringers 75 mL/hr at 12/29/22 0655   tranexamic acid      LOS: 1 day   Shon Hale M.D on 12/29/2022 at 10:10 AM  Go to [email or web address] - for contact info  [facility] - Office  [phone]  If 7PM-7AM, please contact night-coverage [email or web address] 12/29/2022, 10:10 AM

## 2022-12-29 NOTE — Progress Notes (Signed)
Transition of Care [facility]) - Inpatient Brief Assessment   Patient Details  Name: Beth Young MRN: [id number] Date of Birth: 04/27/1945  [facility] CM/SW Contact:    Jamorris Ndiaye A Koden Hunzeker, RN Phone Number: 12/29/2022, 6:05 PM   Clinical Narrative: Patient needing SNF placement. Not able to get in contact with son for voicemail is full. Sent txt to call back RN Case Manager but no response to get a preference of SNF prior to sending request. Per MD, Son request SNF near [place], [place].  After assessment of patient,  no further TOC needs have been identified at this time. TOC will continue to monitor patient advancement through interdisciplinary progression rounds. If new patient transition needs arise, please place a TOC consult.   Transition of Care Asessment: Insurance and Status: Insurance coverage has been reviewed Patient has primary care physician: No Home environment has been reviewed: From home Prior level of function:: Independent Prior/Current Home Services: No current home services Social Determinants of Health Reivew: SDOH reviewed no interventions necessary Readmission risk has been reviewed: (P) Yes Transition of care needs: (P) no transition of care needs at this time

## 2022-12-30 ENCOUNTER — Encounter (HOSPITAL_COMMUNITY): Payer: Self-pay | Admitting: Orthopedic Surgery

## 2022-12-30 DIAGNOSIS — S72001A Fracture of unspecified part of neck of right femur, initial encounter for closed fracture: Secondary | ICD-10-CM | POA: Diagnosis not present

## 2022-12-30 LAB — GLUCOSE, CAPILLARY
Glucose-Capillary: 161 mg/dL — ABNORMAL HIGH (ref 70–99)
Glucose-Capillary: 150 mg/dL — ABNORMAL HIGH (ref 70–99)
Glucose-Capillary: 184 mg/dL — ABNORMAL HIGH (ref 70–99)
Glucose-Capillary: 213 mg/dL — ABNORMAL HIGH (ref 70–99)
Glucose-Capillary: 183 mg/dL — ABNORMAL HIGH (ref 70–99)

## 2022-12-30 MED ORDER — ADULT MULTIVITAMIN W/MINERALS CH
1.0000 | ORAL_TABLET | Freq: Every day | ORAL | Status: DC
  Administered 2022-12-30 – 2022-12-31 (×2): 1 via ORAL
  Filled 2022-12-30 (×2): qty 1

## 2022-12-30 MED ORDER — OXYCODONE HCL 5 MG PO TABS
10.0000 mg | ORAL_TABLET | ORAL | Status: DC | PRN
  Administered 2022-12-30 – 2022-12-31 (×4): 10 mg via ORAL
  Filled 2022-12-30 (×4): qty 2

## 2022-12-30 MED ORDER — CLOPIDOGREL BISULFATE 75 MG PO TABS
75.0000 mg | ORAL_TABLET | Freq: Every day | ORAL | Status: DC
  Administered 2022-12-31: 75 mg via ORAL
  Filled 2022-12-30: qty 1

## 2022-12-30 MED ORDER — ENSURE ENLIVE PO LIQD
237.0000 mL | Freq: Three times a day (TID) | ORAL | Status: DC
  Administered 2022-12-30 – 2022-12-31 (×2): 237 mL via ORAL

## 2022-12-30 MED ORDER — RIVAROXABAN 10 MG PO TABS
10.0000 mg | ORAL_TABLET | Freq: Every day | ORAL | Status: DC
  Administered 2022-12-30 – 2022-12-31 (×2): 10 mg via ORAL
  Filled 2022-12-30 (×2): qty 1

## 2022-12-30 MED ORDER — OXYCODONE HCL 5 MG PO TABS
5.0000 mg | ORAL_TABLET | ORAL | Status: DC | PRN
  Administered 2022-12-30: 5 mg via ORAL
  Filled 2022-12-30: qty 1

## 2022-12-30 NOTE — Evaluation (Signed)
Physical Therapy Evaluation Patient Details Name: Beth Young MRN: [id number] DOB: 1945/05/15 Today's Date: 12/30/2022  History of Present Illness  Beth Young is a 77 y.o. female with medical history significant of hypertension, hyperlipidemia, Parkinson's disease with use of electrical brain stimulation, depression, type 2 diabetes mellitus, GERD, asthma who presents to the emergency department from home via EMS  Due to fall sustained at home.  Patient sustained a fall while walking in her hallway and landed on her right hip resulting in pain of the hip with difficulty in being able to get up from the floor.  EMS was activated and on arrival of EMS and patient was noted with tenderness of right hip and buttock, patient was complaining of pain rated as 10/10 on pain scale, morphine was given, pain is improved to 6/10 on pain scale, patient was taken to the ED for further evaluation and management.  Patient denies hitting her head or loss of consciousness, she denies chest pain, shortness of breath nausea or vomiting. Status post Right hip hemiarthroplasty.   Clinical Impression  Patient demonstrates slow labored movement for sitting up at bedside with difficulty moving RLE due to increasing pain, once seated required a few minutes before able to attempt sit to standing, able to stand using RW, but limited to a few slow labored side steps with mostly shuffling of feet due to poor standing balance/weakness.  Patient tolerated sitting up in chair after therapy while on room air with SpO2 at 95% - nursing staff notified.  Patient will benefit from continued skilled physical therapy in hospital and recommended venue below to increase strength, balance, endurance for safe ADLs and gait.           If plan is discharge home, recommend the following: A lot of help with bathing/dressing/bathroom;A lot of help with walking and/or transfers;Help with stairs or ramp for entrance;Assistance with  cooking/housework   Can travel by private vehicle   No    Equipment Recommendations None recommended by PT  Recommendations for Other Services       Functional Status Assessment Patient has had a recent decline in their functional status and demonstrates the ability to make significant improvements in function in a reasonable and predictable amount of time.     Precautions / Restrictions Precautions Precautions: Fall;Posterior Hip Precaution Booklet Issued: No Precaution Comments: Instructed patient, family on posterior hip precautions and useage of wedge Required Braces or Orthoses: Other Brace Other Brace: wedge Restrictions Weight Bearing Restrictions: Yes RLE Weight Bearing: Weight bearing as tolerated      Mobility  Bed Mobility Overal bed mobility: Needs Assistance Bed Mobility: Supine to Sit     Supine to sit: Mod assist, Max assist, HOB elevated     General bed mobility comments: increased time, labored movement    Transfers Overall transfer level: Needs assistance Equipment used: Rolling walker (2 wheels) Transfers: Sit to/from Stand, Bed to chair/wheelchair/BSC Sit to Stand: Mod assist   Step pivot transfers: Mod assist       General transfer comment: unsteady labored movement with mostly shuffing on RLE due to increasing pain    Ambulation/Gait Ambulation/Gait assistance: Mod assist, Max assist Gait Distance (Feet): 4 Feet Assistive device: Rolling walker (2 wheels) Gait Pattern/deviations: Decreased step length - right, Decreased step length - left, Decreased stride length, Shuffle, Antalgic Gait velocity: slow     General Gait Details: limited to a few slow labored side steps with mostly shuffling on RLE due to increasing pain,  poor standing balance  Stairs            Wheelchair Mobility     Tilt Bed    Modified Rankin (Stroke Patients Only)       Balance Overall balance assessment: Needs assistance Sitting-balance support:  Feet supported, No upper extremity supported Sitting balance-Leahy Scale: Fair Sitting balance - Comments: fair to good at EOB   Standing balance support: Bilateral upper extremity supported, During functional activity, Reliant on assistive device for balance Standing balance-Leahy Scale: Poor Standing balance comment: using RW                             Pertinent Vitals/Pain Pain Assessment Pain Assessment: 0-10 Pain Score: 9  Pain Location: R hip Pain Descriptors / Indicators: Grimacing, Sore, Guarding Pain Intervention(s): Limited activity within patient's tolerance, Monitored during session, Premedicated before session, Repositioned    Home Living Family/patient expects to be discharged to:: Private residence Living Arrangements: Alone Available Help at Discharge: Family;Available PRN/intermittently Type of Home: Apartment Home Access: Level entry       Home Layout: One level Home Equipment: Agricultural consultant (2 wheels);Shower seat;Grab bars - tub/shower;Other (comment)      Prior Function Prior Level of Function : Independent/Modified Independent;History of Falls (last six months)             Mobility Comments: [place] ambulator leaning on furniture; RW used for community ambulation. ADLs Comments: Independent ADL; able to cook and clean.     Extremity/Trunk Assessment   Upper Extremity Assessment Upper Extremity Assessment: Defer to OT evaluation    Lower Extremity Assessment Lower Extremity Assessment: Generalized weakness;RLE deficits/detail RLE Deficits / Details: grossly -3/5 RLE: Unable to fully assess due to pain RLE Sensation: WNL RLE Coordination: WNL    Cervical / Trunk Assessment Cervical / Trunk Assessment: Normal  Communication   Communication Communication: No apparent difficulties  Cognition Arousal: Alert Behavior During Therapy: WFL for tasks assessed/performed Overall Cognitive Status: Within Functional Limits for tasks  assessed                                          General Comments      Exercises     Assessment/Plan    PT Assessment Patient needs continued PT services  PT Problem List Decreased strength;Decreased range of motion;Decreased activity tolerance;Decreased balance;Decreased mobility;Pain       PT Treatment Interventions DME instruction;Gait training;Stair training;Functional mobility training;Therapeutic activities;Therapeutic exercise;Balance training;Patient/family education    PT Goals (Current goals can be found in the Care Plan section)  Acute Rehab PT Goals Patient Stated Goal: return home after rehab PT Goal Formulation: With patient/family Time For Goal Achievement: 01/13/23 Potential to Achieve Goals: Good    Frequency Min 4X/week     Co-evaluation PT/OT/SLP Co-Evaluation/Treatment: Yes Reason for Co-Treatment: To address functional/ADL transfers PT goals addressed during session: Mobility/safety with mobility;Balance;Proper use of DME         AM-PAC PT "6 Clicks" Mobility  Outcome Measure Help needed turning from your back to your side while in a flat bed without using bedrails?: A Lot Help needed moving from lying on your back to sitting on the side of a flat bed without using bedrails?: A Lot Help needed moving to and from a bed to a chair (including a wheelchair)?: A Lot Help needed standing up from  a chair using your arms (e.g., wheelchair or bedside chair)?: A Lot Help needed to walk in hospital room?: A Lot Help needed climbing 3-5 steps with a railing? : Total 6 Click Score: 11    End of Session   Activity Tolerance: Patient tolerated treatment well;Patient limited by fatigue;Patient limited by pain Patient left: in chair;with call bell/phone within reach Nurse Communication: Mobility status PT Visit Diagnosis: Unsteadiness on feet (R26.81);Other abnormalities of gait and mobility (R26.89);Muscle weakness (generalized)  (M62.81);Pain Pain - Right/Left: Right Pain - part of body: Hip    Time: 2956-2130 PT Time Calculation (min) (ACUTE ONLY): 20 min   Charges:   PT Evaluation $PT Eval Moderate Complexity: 1 Mod PT Treatments $Therapeutic Activity: 8-22 mins PT General Charges $$ ACUTE PT VISIT: 1 Visit         1:47 PM, 12/30/22 Ocie Bob, MPT Physical Therapist with [facility] 336 [phone] office [phone] mobile phone

## 2022-12-30 NOTE — Plan of Care (Signed)
  Problem: Activity: Goal: Risk for activity intolerance will decrease Outcome: Progressing   Problem: Coping: Goal: Level of anxiety will decrease Outcome: Progressing   Problem: Pain Managment: Goal: General experience of comfort will improve Outcome: Progressing   

## 2022-12-30 NOTE — Progress Notes (Signed)
PROGRESS NOTE  Beth Young, is a 77 y.o. female, DOB - 30-Mar-1945, QIH:[id number]  Admit date - 12/28/2022   Admitting Physician Frankey Shown, DO  Outpatient Primary MD for the patient is No primary care provider on file.  LOS - 2  Chief Complaint  Patient presents with   Fall      Brief Narrative:  77 y.o. female with medical history significant of hypertension, hyperlipidemia, Parkinson's disease with use of electrical brain stimulation, depression, type 2 diabetes mellitus, GERD, asthma admitted on 12/28/2022 with right hip fracture after mechanical fall    -Assessment and Plan: 1)Rt Hip Closed Fx--- acute right subcapital femoral neck fracture with override and Varus angulation -Orthopedic consult appreciated -s/p  ORIF on 12/29/2022 -Further management per orthopedic team -Alternating oral oxycodone and IV fentanyl prn -Weightbearing: WBAT RLE; posterior hip precautions.  Incisional and dressing care: Reinforce dressings as needed Orthopedic device(s):  Hip abduction pillow in place at all times.  VTE prophylaxis: Resume Plavix POD#1 Pain control: As needed; judicious use of narcotics Follow - up plan: 2 weeks. -With orthopedics  -Given Xarelto 10 mg daily while in the hospital--while she is less mobile, please do not discharge on Xarelto -Patient will be on PTA Plavix per Ortho for DVT prophylaxis when she gets out of here to rehab where she will be more mobile  2)DM2- Use Novolog/Humalog Sliding scale insulin with Accu-Cheks/Fingersticks as ordered  - 3)Leukocytosis--suspect this is reactive in the setting of fall/trauma with right hip fracture -WBC 12.8 >>11.5  4) chronic anemia--- anticipate drop in H&H due to IV fluids/hemodilution and perioperative blood loss -Hemoglobin currently greater than 11 which is close to prior baseline as noted back in 2020 and  2019  5)CKD stage -3A -Renal function appears to be close to baseline - renally adjust medications, avoid  nephrotoxic agents / dehydration  / hypotension  6)HTN--stable, continue lisinopril and propranolol -IV hydralazine as needed elevated BP  7)Parkinson's disease--- patient has a neurostimulator to help her Parkinson's symptoms -Continue Sinemet, increase propranolol to help tremors  8)GERD--stable, continue Protonix  9)Depression/anxiety--stable, continue Lexapro  10)HLD--hold Lipitor given fall and trauma--high risk for elevated CKs   Status is: Inpatient   Disposition: The patient is from: Home              Anticipated d/c is to: SNF              Anticipated d/c date is: 2 days              Patient currently is not medically stable to d/c. Barriers: Not Clinically Stable-   Code Status :  -  Code Status: Full Code   Family Communication:   (patient is alert, awake and coherent)  Discussed with Son Luan Pulling at bedside  DVT Prophylaxis  :   - SCDs  rivaroxaban (XARELTO) tablet 10 mg Start: 12/30/22 2100 SCDs Start: 12/28/22 2055 rivaroxaban (XARELTO) tablet 10 mg   Lab Results  Component Value Date   PLT 201 12/29/2022   Inpatient Medications  Scheduled Meds:  carbidopa-levodopa  1 tablet Oral TID   escitalopram  20 mg Oral Daily   feeding supplement  237 mL Oral TID BM   insulin aspart  0-9 Units Subcutaneous Q4H   lisinopril  20 mg Oral Daily   montelukast  10 mg Oral Daily   multivitamin with minerals  1 tablet Oral Daily   pantoprazole  40 mg Oral Daily   propranolol ER  80 mg Oral  Daily   rivaroxaban  10 mg Oral Daily   Continuous Infusions:   PRN Meds:.acetaminophen **OR** acetaminophen, fentaNYL (SUBLIMAZE) injection, hydrALAZINE, oxyCODONE, prochlorperazine   Anti-infectives (From admission, onward)    Start     Dose/Rate Route Frequency Ordered Stop   12/29/22 2000  ceFAZolin (ANCEF) IVPB 2g/100 mL premix        2 g 200 mL/hr over 30 Minutes Intravenous Every 8 hours 12/29/22 1606 12/30/22 1435   12/29/22 1409  vancomycin (VANCOCIN)  powder  Status:  Discontinued          As needed 12/29/22 1439 12/29/22 1606   12/29/22 1200  ceFAZolin (ANCEF) IVPB 2g/100 mL premix        2 g 200 mL/hr over 30 Minutes Intravenous On call to O.R. 12/28/22 1805 12/29/22 1248      Subjective: [name] today has no fevers, no emesis,  No chest pain,    Son Luan Pulling is at bedside, questions answered - Patient complains of right leg/hip pain - Objective: Vitals:   12/29/22 2300 12/29/22 2340 12/30/22 0356 12/30/22 1448  BP: (!) 146/62 118/65 (!) 145/53 (!) 120/44  Pulse:  69 74 82  Resp:  20 18 18   Temp:  98.6 F (37 C) 98.6 F (37 C) 98.4 F (36.9 C)  TempSrc:      SpO2:  100% 98% 99%  Weight:      Height:        Intake/Output Summary (Last 24 hours) at 12/30/2022 2004 Last data filed at 12/30/2022 0817 Gross per 24 hour  Intake 320 ml  Output 1000 ml  Net -680 ml   Filed Weights   12/28/22 1639 12/29/22 1109  Weight: 60.3 kg 60.3 kg    Physical Exam  Gen:- Awake Alert,  in no apparent distress  HEENT:- [place].AT, No sclera icterus, neurostimulator wires on the right side and frontal area of scalp Neck-Supple Neck,No JVD,.  Lungs-  CTAB , fair symmetrical air movement CV- S1, S2 normal, regular  Abd-  +ve B.Sounds, Abd Soft, No tenderness, neurostimulator in the right lower quadrant    Extremity/Skin:- No  edema, pedal pulses present  Psych-affect is appropriate, oriented x3 Neuro-no new focal deficits, no tremors MSK--Right lower extremity is shortened and rotated  Data Reviewed: I have personally reviewed following labs and imaging studies  CBC: Recent Labs  Lab 12/28/22 1722 12/29/22 0442  WBC 12.8* 11.5*  NEUTROABS 10.2*  --   HGB 12.0 11.5*  HCT 38.6 37.1  MCV 89.6 90.3  PLT 220 201   Basic Metabolic Panel: Recent Labs  Lab 12/28/22 1722 12/29/22 0442  NA 136 136  K 4.2 4.2  CL 108 104  CO2 20* 24  GLUCOSE 155* 192*  BUN 16 19  CREATININE 1.15* 1.08*  CALCIUM 8.6* 8.7*  MG   --  2.1  PHOS  --  3.3   GFR: Estimated Creatinine Clearance: 37.5 mL/min (A) (by C-G formula based on SCr of 1.08 mg/dL (H)). Liver Function Tests: Recent Labs  Lab 12/29/22 0442  AST 49*  ALT 23  ALKPHOS 58  BILITOT 0.4  PROT 6.6  ALBUMIN 3.3*   Radiology Studies: DG HIP UNILAT WITH PELVIS 2-3 VIEWS RIGHT  Result Date: 12/29/2022 CLINICAL DATA:  Status post right hip arthroplasty. EXAM: DG HIP (WITH OR WITHOUT PELVIS) 2-3V RIGHT COMPARISON:  December 28, 2022. FINDINGS: Right hip arthroplasty appears to be well situated. Expected postoperative changes are noted in the surrounding soft tissues. IMPRESSION: Status  post right hip arthroplasty. Electronically Signed   By: Lupita Raider M.D.   On: 12/29/2022 18:22   DG Wrist 2 Views Right  Addendum Date: 12/29/2022   ADDENDUM REPORT: 12/29/2022 15:32 ADDENDUM: Flattening of the trapezium without discrete fracture lucency, of uncertain chronicity, favor chronic. Please correlate with directed clinical exam. These results will be called to the ordering clinician or representative by the Radiologist Assistant, and communication documented in the PACS or [employer]. Electronically Signed   By: Delbert Phenix M.D.   On: 12/29/2022 15:32   Result Date: 12/29/2022 CLINICAL DATA:  Fall, right wrist pain EXAM: RIGHT WRIST - 2 VIEW COMPARISON:  None Available. FINDINGS: No right wrist fracture or dislocation. No suspicious focal osseous lesions. Mild first carpometacarpal joint osteoarthritis. No radiopaque foreign bodies. IMPRESSION: No right wrist fracture or dislocation. Mild first carpometacarpal joint osteoarthritis. Electronically Signed: By: Delbert Phenix M.D. On: 12/29/2022 12:32    Scheduled Meds:  carbidopa-levodopa  1 tablet Oral TID   escitalopram  20 mg Oral Daily   feeding supplement  237 mL Oral TID BM   insulin aspart  0-9 Units Subcutaneous Q4H   lisinopril  20 mg Oral Daily   montelukast  10 mg Oral Daily   multivitamin  with minerals  1 tablet Oral Daily   pantoprazole  40 mg Oral Daily   propranolol ER  80 mg Oral Daily   rivaroxaban  10 mg Oral Daily   Continuous Infusions:   LOS: 2 days   Shon Hale M.D on 12/30/2022 at 8:04 PM  Go to [email or web address] - for contact info  [facility] - Office  [phone]  If 7PM-7AM, please contact night-coverage [email or web address] 12/30/2022, 8:04 PM

## 2022-12-30 NOTE — [place] FL2 (Signed)
[place] MEDICAID FL2 LEVEL OF CARE FORM     IDENTIFICATION  Patient Name: Beth Young Birthdate: [birth year] Sex: female Admission Date (Current Location): 12/28/2022  [facility] and [place] Number:  [employer] and Address:  [facility],  [address] [place] [address]      Provider Number: [id number]  Attending Physician Name and Address:  Shon Hale, MD  Relative Name and Phone Number:       Current Level of Care: Hospital Recommended Level of Care: Skilled Nursing Facility Prior Approval Number:    Date Approved/Denied:   PASRR Number: [id number]  Discharge Plan: SNF    Current Diagnoses: Patient Active Problem List   Diagnosis Date Noted   Closed displaced fracture of right femoral neck (HCC) 12/28/2022   Essential hypertension 12/28/2022   Type 2 diabetes mellitus with hyperglycemia (HCC) 12/28/2022   Asthma, chronic 12/28/2022   Depression 12/28/2022   Parkinson disease (HCC) 12/28/2022   Dehydration 12/28/2022   DDD (degenerative disc disease), cervical 05/31/2019   Facet arthropathy, lumbar 05/31/2019   Chronic bilateral low back pain without sciatica 05/31/2019   Neurogenic claudication 05/31/2019   Hereditary ataxia, 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   Anemia 11/12/2018   Low back pain 10/20/2018   Fecal incontinence 04/14/2017   Diarrhea 12/11/2016   Status post deep brain stimulator placement 07/16/2016   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) 04/24/2016   Insomnia due to medical condition 04/16/2016   Osteoporosis 04/16/2016   Essential tremor 04/17/2014   S/P deep brain stimulator placement 04/17/2014   Falls frequently 10/10/2013   Ataxia 08/24/2013   Restless leg 01/15/2012   Diabetes mellitus, type 2 (HCC) 06/02/2011   CAFL (chronic airflow limitation) (HCC) 10/02/2010   GERD without esophagitis 10/02/2010   Mixed hyperlipidemia 10/02/2010   Osteoporosis, 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     Orientation RESPIRATION BLADDER Height & Weight     Self, Time, Situation, Place  O2 (3L) Incontinent Weight: 132 lb 15 oz (60.3 kg) Height:  5\' 1"  (154.9 cm)  BEHAVIORAL SYMPTOMS/MOOD NEUROLOGICAL BOWEL NUTRITION STATUS      Incontinent Diet (See d/c summary)  AMBULATORY STATUS COMMUNICATION OF NEEDS Skin   Extensive Assist Verbally Surgical wounds                       Personal Care Assistance Level of Assistance  Bathing, Feeding, Dressing Bathing Assistance: Limited assistance Feeding assistance: Independent Dressing Assistance: Limited assistance     Functional Limitations Info  Sight, Hearing, Speech Sight Info: Impaired Hearing Info: Adequate Speech Info: Adequate    SPECIAL CARE FACTORS FREQUENCY  PT (By licensed PT), OT (By licensed OT)     PT Frequency: 5x/weekly OT Frequency: 5x/weekly            Contractures Contractures Info: Not present    Additional Factors Info  Allergies, Code Status Code Status Info: Full Allergies Info: Bupropion, Codeine, Tape,  BANDAIDS,  Trichlormethiazide, Penicillins, Silver  Patient has moderate blistering with tegaderm use, Sulfa Antibiotics           Current Medications (12/30/2022):  This is the current hospital active medication list Current Facility-Administered Medications  Medication Dose Route Frequency Provider Last Rate Last Admin   acetaminophen (TYLENOL) tablet 650 mg  650 mg Oral Q6H PRN Oliver Barre, MD   650 mg at 12/29/22 4742   Or   acetaminophen (TYLENOL) suppository 650 mg  650 mg Rectal Q6H PRN Oliver Barre, MD  carbidopa-levodopa (SINEMET IR) 25-100 MG per tablet immediate release 1 tablet  1 tablet Oral TID Oliver Barre, MD   1 tablet at 12/30/22 0902   ceFAZolin (ANCEF) IVPB 2g/100 mL premix  2 g Intravenous Q8H Thane Edu A, MD 200 mL/hr at 12/30/22 0513 2 g at 12/30/22 0513   escitalopram (LEXAPRO) tablet 20 mg  20 mg Oral Daily Oliver Barre, MD   20 mg at 12/30/22 0901    fentaNYL (SUBLIMAZE) injection 50 mcg  50 mcg Intravenous Q2H PRN Oliver Barre, MD   50 mcg at 12/30/22 2956   hydrALAZINE (APRESOLINE) injection 10 mg  10 mg Intravenous Q6H PRN Oliver Barre, MD       insulin aspart (novoLOG) injection 0-9 Units  0-9 Units Subcutaneous Q4H Oliver Barre, MD   1 Units at 12/30/22 0906   lisinopril (ZESTRIL) tablet 20 mg  20 mg Oral Daily Oliver Barre, MD   20 mg at 12/30/22 0901   montelukast (SINGULAIR) tablet 10 mg  10 mg Oral Daily Oliver Barre, MD   10 mg at 12/30/22 2130   oxyCODONE (Oxy IR/ROXICODONE) immediate release tablet 5 mg  5 mg Oral Q4H PRN Emokpae, Courage, MD   5 mg at 12/30/22 1012   pantoprazole (PROTONIX) EC tablet 40 mg  40 mg Oral Daily Oliver Barre, MD   40 mg at 12/30/22 0901   prochlorperazine (COMPAZINE) injection 10 mg  10 mg Intravenous Q6H PRN Oliver Barre, MD       propranolol ER (INDERAL LA) 24 hr capsule 80 mg  80 mg Oral Daily Oliver Barre, MD   80 mg at 12/30/22 0902     Discharge Medications: Please see discharge summary for a list of discharge medications.  Relevant Imaging Results:  Relevant Lab Results:   Additional Information SSN: [id number]. Patient has brain stimulator for Parkinson's.  Beth Cassis, LCSW

## 2022-12-30 NOTE — Plan of Care (Signed)
  Problem: Acute Rehab OT Goals (only OT should resolve) Goal: Pt. Will Perform Grooming Flowsheets (Taken 12/30/2022 0953) Pt Will Perform Grooming:  standing  with contact guard assist Goal: Pt. Will Perform Lower Body Bathing Flowsheets (Taken 12/30/2022 0953) Pt Will Perform Lower Body Bathing:  with min assist  sitting/lateral leans Goal: Pt. Will Perform Lower Body Dressing Flowsheets (Taken 12/30/2022 0953) Pt Will Perform Lower Body Dressing:  with min assist  sitting/lateral leans Goal: Pt. Will Transfer To Toilet Flowsheets (Taken 12/30/2022 [phone]) Pt Will Transfer to Toilet:  with contact guard assist  stand pivot transfer Goal: Pt. Will Perform Toileting-Clothing Manipulation Flowsheets (Taken 12/30/2022 0953) Pt Will Perform Toileting - Clothing Manipulation and hygiene:  with modified independence  sitting/lateral leans  Justis Dupas OT, MOT

## 2022-12-30 NOTE — Evaluation (Signed)
Occupational Therapy Evaluation Patient Details Name: Beth Young MRN: [id number] DOB: 1945-07-11 Today's Date: 12/30/2022   History of Present Illness Beth Young is a 77 y.o. female with medical history significant of hypertension, hyperlipidemia, Parkinson's disease with use of electrical brain stimulation, depression, type 2 diabetes mellitus, GERD, asthma who presents to the emergency department from home via EMS  Due to fall sustained at home.  Patient sustained a fall while walking in her hallway and landed on her right hip resulting in pain of the hip with difficulty in being able to get up from the floor.  EMS was activated and on arrival of EMS and patient was noted with tenderness of right hip and buttock, patient was complaining of pain rated as 10/10 on pain scale, morphine was given, pain is improved to 6/10 on pain scale, patient was taken to the ED for further evaluation and management.  Patient denies hitting her head or loss of consciousness, she denies chest pain, shortness of breath nausea or vomiting. Status post Right hip hemiarthroplasty. (per DO)   Clinical Impression   Pt agreeable to OT and PT co-evaluation. Pt mostly independent at baseline with use of RW for community ambulation. Pt requiring mod A for transfers with RW today. Much assist needed for lower body dressing due to pain and precautions. Pt demonstrates WFL A/ROM of B UE making upper body ADL's more set up assist.  Mod to max A needed for bed mobility. Pt was left in the chair with family present. Pt will benefit from continued OT in the hospital and recommended venue below to increase strength, balance, and endurance for safe ADL's.         If plan is discharge home, recommend the following: A lot of help with walking and/or transfers;A lot of help with bathing/dressing/bathroom;Assistance with cooking/housework;Assist for transportation;Help with stairs or ramp for entrance    Functional Status  Assessment  Patient has had a recent decline in their functional status and demonstrates the ability to make significant improvements in function in a reasonable and predictable amount of time.  Equipment Recommendations  None recommended by OT           Precautions / Restrictions Precautions Precautions: Fall Restrictions Weight Bearing Restrictions: No      Mobility Bed Mobility Overal bed mobility: Needs Assistance Bed Mobility: Supine to Sit     Supine to sit: Mod assist, Max assist, HOB elevated     General bed mobility comments: labored movement; increased pain; much assist.    Transfers Overall transfer level: Needs assistance Equipment used: Rolling walker (2 wheels) Transfers: Sit to/from Stand, Bed to chair/wheelchair/BSC Sit to Stand: Mod assist     Step pivot transfers: Mod assist     General transfer comment: Pt sliding B LE rather than taking full steps in most attempts. Labored effort and extended time.      Balance Overall balance assessment: Needs assistance Sitting-balance support: No upper extremity supported, Feet supported Sitting balance-Leahy Scale: Good Sitting balance - Comments: fair to good at EOB   Standing balance support: Bilateral upper extremity supported, During functional activity, Reliant on assistive device for balance Standing balance-Leahy Scale: Poor Standing balance comment: using RW                           ADL either performed or assessed with clinical judgement   ADL Overall ADL's : Needs assistance/impaired     Grooming: Set up;Sitting  Upper Body Bathing: Contact guard assist;Set up;Sitting   Lower Body Bathing: Maximal assistance;Sitting/lateral leans       Lower Body Dressing: Maximal assistance;Sitting/lateral leans   Toilet Transfer: Moderate assistance;Stand-pivot;Rolling walker (2 wheels) Toilet Transfer Details (indicate cue type and reason): Simulated via EOB to chair  transfer. Toileting- Clothing Manipulation and Hygiene: Moderate assistance;Sitting/lateral lean;Maximal assistance       Functional mobility during ADLs: Moderate assistance;Maximal assistance;Rolling walker (2 wheels)       Vision Baseline Vision/History: 1 Wears glasses Ability to See in Adequate Light: 1 Impaired Patient Visual Report: No change from baseline Vision Assessment?: No apparent visual deficits     Perception Perception: Not tested       Praxis Praxis: Not tested       Pertinent Vitals/Pain Pain Assessment Pain Assessment: 0-10 Pain Score: 10-Worst pain ever Pain Location: R hip Pain Descriptors / Indicators: Grimacing, Sore, Guarding Pain Intervention(s): Limited activity within patient's tolerance, Monitored during session, Repositioned     Extremity/Trunk Assessment Upper Extremity Assessment Upper Extremity Assessment: Generalized weakness   Lower Extremity Assessment Lower Extremity Assessment: Defer to PT evaluation   Cervical / Trunk Assessment Cervical / Trunk Assessment: Normal   Communication Communication Communication: No apparent difficulties   Cognition Arousal: Alert Behavior During Therapy: WFL for tasks assessed/performed Overall Cognitive Status: Within Functional Limits for tasks assessed                                                        Home Living Family/patient expects to be discharged to:: Private residence Living Arrangements: Alone Available Help at Discharge: Family;Available PRN/intermittently (Possibly 24/7 assist if needed. Pt seemed unsure.) Type of Home: Apartment Home Access: Level entry     Home Layout: One level     Bathroom Shower/Tub: Chief Strategy Officer: Standard Bathroom Accessibility: Yes How Accessible: Accessible via walker Home Equipment: Rolling Walker (2 wheels);Shower seat;Grab bars - tub/shower;Other (comment) (scooter that does not work)           Prior Functioning/Environment Prior Level of Function : Independent/Modified Independent;History of Falls (last six months) (4 to 5  falls in past 6 months)             Mobility Comments: [place] ambulator leaning on furniture; RW used for community ambulation. ADLs Comments: Independent ADL; able to cook and clean.        OT Problem List: Decreased strength;Decreased range of motion;Decreased activity tolerance;Impaired balance (sitting and/or standing);Pain      OT Treatment/Interventions: Self-care/ADL training;Therapeutic exercise;Therapeutic activities;Patient/family education;DME and/or AE instruction    OT Goals(Current goals can be found in the care plan section) Acute Rehab OT Goals Patient Stated Goal: Improve function at rehab. OT Goal Formulation: With patient Time For Goal Achievement: 01/13/23 Potential to Achieve Goals: Good  OT Frequency: Min 2X/week                                   End of Session Equipment Utilized During Treatment: Rolling walker (2 wheels)  Activity Tolerance: Patient tolerated treatment well Patient left: in chair;with call bell/phone within reach;with family/visitor present  OT Visit Diagnosis: Unsteadiness on feet (R26.81);Repeated falls (R29.6);Muscle weakness (generalized) (M62.81);Other abnormalities of gait and mobility (R26.89);History of falling (Z91.81)  Time: 2130-8657 OT Time Calculation (min): 21 min Charges:  OT General Charges $OT Visit: 1 Visit OT Evaluation $OT Eval Low Complexity: 1 Low  Chelsi Warr OT, MOT   Danie Chandler 12/30/2022, 9:51 AM

## 2022-12-30 NOTE — Plan of Care (Signed)
  Problem: Acute Rehab PT Goals(only PT should resolve) Goal: Pt Will Go Supine/Side To Sit Outcome: Progressing Flowsheets (Taken 12/30/2022 1348) Pt will go Supine/Side to Sit:  with minimal assist  with moderate assist Goal: Patient Will Transfer Sit To/From Stand Outcome: Progressing Flowsheets (Taken 12/30/2022 1348) Patient will transfer sit to/from stand:  with minimal assist  with moderate assist Goal: Pt Will Transfer Bed To Chair/Chair To Bed Outcome: Progressing Flowsheets (Taken 12/30/2022 1348) Pt will Transfer Bed to Chair/Chair to Bed:  with min assist  with mod assist Goal: Pt Will Ambulate Outcome: Progressing Flowsheets (Taken 12/30/2022 1348) Pt will Ambulate:  25 feet  with moderate assist  with rolling walker   1:49 PM, 12/30/22 Ocie Bob, MPT Physical Therapist with [facility] [phone] office [phone] mobile phone

## 2022-12-30 NOTE — Progress Notes (Signed)
Initial Nutrition Assessment  DOCUMENTATION CODES:   Not applicable  INTERVENTION:   Chocolate Ensure Plus High Protein po TID, each supplement provides 350 kcal and 20 grams of protein. MVI with minerals daily. Education provided on ways to increase protein intake at home.  NUTRITION DIAGNOSIS:   Increased nutrient needs related to post-op healing as evidenced by estimated needs.  GOAL:   Patient will meet greater than or equal to 90% of their needs  MONITOR:   PO intake, Supplement acceptance  REASON FOR ASSESSMENT:   Malnutrition Screening Tool    ASSESSMENT:   77 yo female admitted with R hip fracture S/P fall at home. PMH includes HTN, asthma, Parkinson's disease, stroke, DM-2, GERD, osteoporosis, HLD, essential tremor.  10/21 S/P R hip hemiarthroplasty  Patient reports that she has been eating less at home over the past several months. She does not feel hungry and c/o early satiety. She snacks on cookies, M&Ms, nabs, and peanuts. Suspect her protein intake is not adequate at home especially to support healing and recovery after surgery. We discussed ways to increase protein intake and RD added "Tips for Adding Protein" handout from the Academy of Nutrition and Dietetics to discharge instructions. She likes chocolate Ensure and agreed to drink between meals to help increase protein intake.   Labs reviewed. A1C 7.2 CBG: [phone]  Medications reviewed and include novolog SSI q4h, protonix.  No recent weight weight history available for review.   NUTRITION - FOCUSED PHYSICAL EXAM:  Flowsheet Row Most Recent Value  Orbital Region No depletion  Upper Arm Region No depletion  Thoracic and Lumbar Region No depletion  Buccal Region No depletion  Temple Region No depletion  Clavicle Bone Region No depletion  Clavicle and Acromion Bone Region No depletion  Scapular Bone Region No depletion  Dorsal Hand Mild depletion  Patellar Region Unable to assess  Anterior  Thigh Region Unable to assess  Posterior Calf Region Unable to assess  Edema (RD Assessment) Unable to assess  Hair Reviewed  Eyes Reviewed  Mouth Reviewed  Skin Reviewed  Nails Reviewed       Diet Order:   Diet Order             Diet heart healthy/carb modified Room service appropriate? Yes; Fluid consistency: Thin  Diet effective now                   EDUCATION NEEDS:   Education needs have been addressed  Skin:  Skin Assessment: Reviewed RN Assessment  Last BM:  10/19  Height:   Ht Readings from Last 1 Encounters:  12/29/22 5\' 1"  (1.549 m)    Weight:   Wt Readings from Last 1 Encounters:  12/29/22 60.3 kg    Ideal Body Weight:  47.7 kg  BMI:  Body mass index is 25.12 kg/m.  Estimated Nutritional Needs:   Kcal:  1600-1800  Protein:  75-85 gm  Fluid:  1.6-1.8 L   Gabriel Rainwater RD, LDN, CNSC Please refer to Amion for contact information.

## 2022-12-30 NOTE — Discharge Instructions (Addendum)
Nutrition Post Hospital Stay °Proper nutrition can help your body recover from illness and injury.   °Foods and beverages high in protein, vitamins, and minerals help rebuild muscle loss, promote healing, & reduce fall risk.  ° °•In addition to eating healthy foods, a nutrition shake is an easy, delicious way to get the nutrition you need during and after your hospital stay ° °It is recommended that you continue to drink 2 bottles per day of:       Ensure Plus for at least 1 month (30 days) after your hospital stay  ° °Tips for adding a nutrition shake into your routine: °As allowed, drink one with vitamins or medications instead of water or juice °Enjoy one as a tasty mid-morning or afternoon snack °Drink cold or make a milkshake out of it °Drink one instead of milk with cereal or snacks °Use as a coffee creamer °  °Available at the following grocery stores and pharmacies:           °* [employer] * [employer] * [employer]  °* [employer]          * Walmart * [employer]  °* Walgreens      * Target  * [employer]J's   °* [employer]S  * [employer]   °* [facility] [phone]  °          °For COUPONS visit: www.ensure.com/join or www.boost.com/members/sign-up  ° °Suggested Substitutions °Ensure Plus = Boost Plus = Carnation Breakfast Essentials = Boost Compact °Ensure Active Clear = Boost Breeze °Glucerna Shake = Boost Glucose Control = Carnation Breakfast Essentials SUGAR FREE ° °  °

## 2022-12-30 NOTE — TOC Progression Note (Signed)
Transition of Care [facility]) - Progression Note    Patient Details  Name: Beth Young MRN: [id number] Date of Birth: March 28, 1945  Transition of Care [facility]) CM/SW Contact  Karn Cassis, [place] Phone Number: 12/30/2022, 11:31 AM  Clinical Narrative: Pt and daughter-in-law in room accept bed at [facility] Facility notified. CMA to update authorization.       Expected Discharge Plan: Skilled Nursing Facility Barriers to Discharge: Continued Medical Work up  Expected Discharge Plan and Services In-house Referral: Clinical Social Work   Post Acute Care Choice: Skilled Nursing Facility Living arrangements for the past 2 months: Apartment                                       Social Determinants of Health (SDOH) Interventions SDOH Screenings   Food Insecurity: No Food Insecurity (12/28/2022)  Housing: Low Risk  (12/28/2022)  Transportation Needs: No Transportation Needs (12/28/2022)  Utilities: Not At Risk (12/28/2022)  Depression (PHQ2-9): Low Risk  (02/08/2020)  Tobacco Use: High Risk (12/29/2022)    Readmission Risk Interventions     No data to display

## 2022-12-30 NOTE — Progress Notes (Signed)
   ORTHOPAEDIC PROGRESS NOTE  s/p Procedure(s): Right  ARTHROPLASTY BIPOLAR HIP (HEMIARTHROPLASTY)  DOS: 12/29/22  SUBJECTIVE: No issues over night.  Foley removed.  Occasional sharp pains.  No numbness or tingling.   OBJECTIVE: PE:  Alert and oriented, no acute distress  Right hip dressing is clean, dry and intact Hip abduction pillow in place Toes are WWP Wiggles toes and DF ankle   Vitals:   12/29/22 2340 12/30/22 0356  BP: 118/65 (!) 145/53  Pulse: 69 74  Resp: 20 18  Temp: 98.6 F (37 C) 98.6 F (37 C)  SpO2: 100% 98%       Latest Ref Rng & Units 12/29/2022    4:42 AM 12/28/2022    5:22 PM 11/16/2018   10:36 AM  CBC  WBC 4.0 - 10.5 K/uL 11.5  12.8  7.5   Hemoglobin 12.0 - 15.0 g/dL 65.7  84.6  96.2   Hematocrit 36.0 - 46.0 % 37.1  38.6  33.2   Platelets 150 - 400 K/uL 201  220  248      ASSESSMENT: Beth Young is a 77 y.o. female doing well postoperatively.  POD#1 right hip hemi.   PLAN: Weightbearing: WBAT RLE; posterior hip precautions.  Incisional and dressing care: Reinforce dressings as needed Orthopedic device(s):  Hip abduction pillow in place at all times.  VTE prophylaxis: Resume Plavix POD#1 Pain control: As needed; judicious use of narcotics Follow - up plan: 2 weeks.  If she is DC to a SNF, sutures can be trimmed approximately 2 weeks postop, and I can see her in clinic in 6 weeks.  If there are any questions or concerns, I am happy to see the patient at any time in clinic.    Contact information:     Tresten Pantoja A. Dallas Schimke, MD MS [facility] [address] [place],  [place]  [address] Phone: [phone] Fax: [phone]

## 2022-12-30 NOTE — TOC Initial Note (Signed)
Transition of Care [facility]) - Initial/Assessment Note    Patient Details  Name: Beth Young MRN: [id number] Date of Birth: 1945/06/02  Transition of Care [facility]) CM/SW Contact:    Karn Cassis, LCSW Phone Number: 12/30/2022, 10:53 AM  Clinical Narrative: Pt admitted due to right hip fracture. Pt's son reports pt lives alone. She sometimes uses a walker at home. PT recommending SNF. LCSW discussed placement process, including Medicare.gov ratings and SNF authorization. Son requests [facility] or [facility]. CMA starting authorization. SNF referral sent. TOC will follow up with bed offers when available.                  Expected Discharge Plan: Skilled Nursing Facility Barriers to Discharge: Continued Medical Work up   Patient Goals and CMS Choice Patient states their goals for this hospitalization and ongoing recovery are:: SNF   Choice offered to / list presented to : Adult Children [place] ownership interest in [facility].provided to:: Adult Children    Expected Discharge Plan and Services In-house Referral: Clinical Social Work   Post Acute Care Choice: Skilled Nursing Facility Living arrangements for the past 2 months: Apartment                                      Prior Living Arrangements/Services Living arrangements for the past 2 months: Apartment Lives with:: Self Patient language and need for interpreter reviewed:: Yes Do you feel safe going back to the place where you live?:  (Needs SNF)      Need for Family Participation in Patient Care: Yes (Comment)   Current home services: DME (walker) Criminal Activity/Legal Involvement Pertinent to Current Situation/Hospitalization: No - Comment as needed  Activities of Daily Living   ADL Screening (condition at time of admission) Independently performs ADLs?: Yes (appropriate for developmental age) Is the patient deaf or have difficulty hearing?: No Does the patient have difficulty seeing,  even when wearing glasses/contacts?: No Does the patient have difficulty concentrating, remembering, or making decisions?: No  Permission Sought/Granted                  Emotional Assessment         Alcohol / Substance Use: Not Applicable Psych Involvement: No (comment)  Admission diagnosis:  Hip fracture, right, closed, initial encounter (HCC) [S72.001A] Closed displaced fracture of right femoral neck (HCC) [S72.001A] Patient Active Problem List   Diagnosis Date Noted   Closed displaced fracture of right femoral neck (HCC) 12/28/2022   Essential hypertension 12/28/2022   Type 2 diabetes mellitus with hyperglycemia (HCC) 12/28/2022   Asthma, chronic 12/28/2022   Depression 12/28/2022   Parkinson disease (HCC) 12/28/2022   Dehydration 12/28/2022   DDD (degenerative disc disease), cervical 05/31/2019   Facet arthropathy, lumbar 05/31/2019   Chronic bilateral low back pain without sciatica 05/31/2019   Neurogenic claudication 05/31/2019   Hereditary ataxia, 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   Anemia 11/12/2018   Low back pain 10/20/2018   Fecal incontinence 04/14/2017   Diarrhea 12/11/2016   Status post deep brain stimulator placement 07/16/2016   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 Pharmacy:   [facility] [place], [place] - [address] [place] [address] Phone: [phone] Fax: [phone]     Social Determinants of  Health (SDOH) Social History: SDOH Screenings   Food Insecurity: No Food Insecurity (12/28/2022)  Housing: Low Risk  (12/28/2022)  Transportation Needs: No Transportation Needs (12/28/2022)  Utilities: Not At Risk (12/28/2022)  Depression (PHQ2-9): Low Risk  (02/08/2020)  Tobacco Use: High Risk (12/29/2022)   SDOH Interventions:     Readmission Risk Interventions     No data to display

## 2022-12-31 DIAGNOSIS — R262 Difficulty in walking, not elsewhere classified: Secondary | ICD-10-CM | POA: Diagnosis not present

## 2022-12-31 DIAGNOSIS — E119 Type 2 diabetes mellitus without complications: Secondary | ICD-10-CM | POA: Diagnosis not present

## 2022-12-31 DIAGNOSIS — F32A Depression, unspecified: Secondary | ICD-10-CM | POA: Diagnosis not present

## 2022-12-31 DIAGNOSIS — Z9689 Presence of other specified functional implants: Secondary | ICD-10-CM | POA: Diagnosis not present

## 2022-12-31 DIAGNOSIS — I69959 Hemiplegia and hemiparesis following unspecified cerebrovascular disease affecting unspecified side: Secondary | ICD-10-CM | POA: Diagnosis not present

## 2022-12-31 DIAGNOSIS — E785 Hyperlipidemia, unspecified: Secondary | ICD-10-CM | POA: Diagnosis not present

## 2022-12-31 DIAGNOSIS — E871 Hypo-osmolality and hyponatremia: Secondary | ICD-10-CM | POA: Diagnosis not present

## 2022-12-31 DIAGNOSIS — M810 Age-related osteoporosis without current pathological fracture: Secondary | ICD-10-CM | POA: Diagnosis not present

## 2022-12-31 DIAGNOSIS — D649 Anemia, unspecified: Secondary | ICD-10-CM | POA: Diagnosis not present

## 2022-12-31 DIAGNOSIS — I10 Essential (primary) hypertension: Secondary | ICD-10-CM | POA: Diagnosis not present

## 2022-12-31 DIAGNOSIS — K219 Gastro-esophageal reflux disease without esophagitis: Secondary | ICD-10-CM | POA: Diagnosis not present

## 2022-12-31 DIAGNOSIS — G250 Essential tremor: Secondary | ICD-10-CM | POA: Diagnosis not present

## 2022-12-31 DIAGNOSIS — R29898 Other symptoms and signs involving the musculoskeletal system: Secondary | ICD-10-CM | POA: Diagnosis not present

## 2022-12-31 DIAGNOSIS — G8929 Other chronic pain: Secondary | ICD-10-CM | POA: Diagnosis not present

## 2022-12-31 DIAGNOSIS — M5030 Other cervical disc degeneration, unspecified cervical region: Secondary | ICD-10-CM | POA: Diagnosis not present

## 2022-12-31 DIAGNOSIS — S72001D Fracture of unspecified part of neck of right femur, subsequent encounter for closed fracture with routine healing: Secondary | ICD-10-CM | POA: Diagnosis not present

## 2022-12-31 DIAGNOSIS — R6889 Other general symptoms and signs: Secondary | ICD-10-CM | POA: Diagnosis not present

## 2022-12-31 DIAGNOSIS — Z471 Aftercare following joint replacement surgery: Secondary | ICD-10-CM | POA: Diagnosis not present

## 2022-12-31 DIAGNOSIS — E1165 Type 2 diabetes mellitus with hyperglycemia: Secondary | ICD-10-CM | POA: Diagnosis not present

## 2022-12-31 DIAGNOSIS — M6281 Muscle weakness (generalized): Secondary | ICD-10-CM | POA: Diagnosis not present

## 2022-12-31 DIAGNOSIS — Z7401 Bed confinement status: Secondary | ICD-10-CM | POA: Diagnosis not present

## 2022-12-31 DIAGNOSIS — J45909 Unspecified asthma, uncomplicated: Secondary | ICD-10-CM | POA: Diagnosis not present

## 2022-12-31 DIAGNOSIS — R5381 Other malaise: Secondary | ICD-10-CM | POA: Diagnosis not present

## 2022-12-31 DIAGNOSIS — J4520 Mild intermittent asthma, uncomplicated: Secondary | ICD-10-CM | POA: Diagnosis not present

## 2022-12-31 DIAGNOSIS — R29818 Other symptoms and signs involving the nervous system: Secondary | ICD-10-CM | POA: Diagnosis not present

## 2022-12-31 DIAGNOSIS — M47816 Spondylosis without myelopathy or radiculopathy, lumbar region: Secondary | ICD-10-CM | POA: Diagnosis not present

## 2022-12-31 DIAGNOSIS — R278 Other lack of coordination: Secondary | ICD-10-CM | POA: Diagnosis not present

## 2022-12-31 DIAGNOSIS — S72001A Fracture of unspecified part of neck of right femur, initial encounter for closed fracture: Secondary | ICD-10-CM | POA: Diagnosis not present

## 2022-12-31 DIAGNOSIS — G20A1 Parkinson's disease without dyskinesia, without mention of fluctuations: Secondary | ICD-10-CM | POA: Diagnosis not present

## 2022-12-31 DIAGNOSIS — E782 Mixed hyperlipidemia: Secondary | ICD-10-CM | POA: Diagnosis not present

## 2022-12-31 DIAGNOSIS — E559 Vitamin D deficiency, unspecified: Secondary | ICD-10-CM | POA: Diagnosis not present

## 2022-12-31 LAB — BASIC METABOLIC PANEL
Anion gap: 8 (ref 5–15)
BUN: 15 mg/dL (ref 8–23)
CO2: 25 mmol/L (ref 22–32)
Calcium: 8.2 mg/dL — ABNORMAL LOW (ref 8.9–10.3)
Chloride: 99 mmol/L (ref 98–111)
Creatinine, Ser: 1 mg/dL (ref 0.44–1.00)
GFR, Estimated: 59 mL/min — ABNORMAL LOW (ref >60)
Glucose, Bld: 150 mg/dL — ABNORMAL HIGH (ref 70–99)
Potassium: 4.1 mmol/L (ref 3.5–5.1)
Sodium: 132 mmol/L — ABNORMAL LOW (ref 135–145)

## 2022-12-31 LAB — CBC
HCT: 31 % — ABNORMAL LOW (ref 36.0–46.0)
Hemoglobin: 9.6 g/dL — ABNORMAL LOW (ref 12.0–15.0)
MCH: 27.2 pg (ref 26.0–34.0)
MCHC: 31 g/dL (ref 30.0–36.0)
MCV: 87.8 fL (ref 80.0–100.0)
Platelets: 168 10*3/uL (ref 150–400)
RBC: 3.53 MIL/uL — ABNORMAL LOW (ref 3.87–5.11)
RDW: 13.2 % (ref 11.5–15.5)
WBC: 12.1 10*3/uL — ABNORMAL HIGH (ref 4.0–10.5)
nRBC: 0 % (ref 0.0–0.2)

## 2022-12-31 LAB — GLUCOSE, CAPILLARY
Glucose-Capillary: 155 mg/dL — ABNORMAL HIGH (ref 70–99)
Glucose-Capillary: 158 mg/dL — ABNORMAL HIGH (ref 70–99)

## 2022-12-31 MED ORDER — ACETAMINOPHEN 500 MG PO TABS: 1000.0000 mg | ORAL_TABLET | Freq: Three times a day (TID) | ORAL | Status: AC

## 2022-12-31 MED ORDER — DICYCLOMINE HCL 10 MG PO CAPS: 10.0000 mg | ORAL_CAPSULE | Freq: Three times a day (TID) | ORAL | Status: AC | PRN

## 2022-12-31 MED ORDER — ATORVASTATIN CALCIUM 80 MG PO TABS: 40.0000 mg | ORAL_TABLET | Freq: Every day | ORAL | Status: AC

## 2022-12-31 MED ORDER — OXYCODONE HCL 5 MG PO TABS: 5.0000 mg | ORAL_TABLET | Freq: Four times a day (QID) | ORAL | 0 refills | Status: AC | PRN

## 2022-12-31 NOTE — Plan of Care (Signed)

## 2022-12-31 NOTE — TOC Transition Note (Signed)
Transition of Care [facility]) - CM/SW Discharge Note   Patient Details  Name: Beth Young MRN: [id number] Date of Birth: 1945/05/22  Transition of Care [facility]) CM/SW Contact:  Karn Cassis, LCSW Phone Number: 12/31/2022, 1:27 PM   Clinical Narrative: Pt d/c today to [facility]. Pt, pt's daughter-in-law in room, and SNF aware and agreeable. Pt and RN agree pt will need EMS. LCSW to arrange transport with [facility]. SNF authorization received. D/C summary sent to SNF. RN given number to call report.       Final next level of care: Skilled Nursing Facility Barriers to Discharge: Barriers Resolved   Patient Goals and CMS Choice   Choice offered to / list presented to : Adult Children  Discharge Placement                  Patient to be transferred to facility by: [facility] Name of family member notified: daughter-in-law in room Patient and family notified of of transfer: 12/31/22  Discharge Plan and Services Additional resources added to the After Visit Summary for   In-house Referral: Clinical Social Work   Post Acute Care Choice: Skilled Nursing Facility                               Social Determinants of Health (SDOH) Interventions SDOH Screenings   Food Insecurity: No Food Insecurity (12/28/2022)  Housing: Low Risk  (12/28/2022)  Transportation Needs: No Transportation Needs (12/28/2022)  Utilities: Not At Risk (12/28/2022)  Depression (PHQ2-9): Low Risk  (02/08/2020)  Tobacco Use: High Risk (12/29/2022)     Readmission Risk Interventions     No data to display

## 2022-12-31 NOTE — Discharge Summary (Signed)
Physician Discharge Summary   Patient: Beth Young MRN: [id number] DOB: August 29, 1945  Admit date:     12/28/2022  Discharge date: 12/31/22  Discharge Physician: Vassie Loll   PCP: No primary care provider on file.   Recommendations at discharge:  Repeat basic metabolic panel in 10 days to follow electrolytes and renal function Repeat CBC in 10 days to follow hemoglobin trend and stability Outpatient follow-up with orthopedic service in 2 weeks (Dr.Cairns)  Discharge Diagnoses: Principal Problem:   Closed displaced fracture of right femoral neck (HCC) Active Problems:   GERD without esophagitis   Mixed hyperlipidemia   Essential hypertension   Type 2 diabetes mellitus with hyperglycemia (HCC)   Asthma, chronic   Depression   Parkinson disease (HCC)   Dehydration  Brief admission Hospital Course:  Beth Young is a 77 y.o. female with medical history significant of hypertension, hyperlipidemia, Parkinson's disease with use of electrical brain stimulation, depression, type 2 diabetes mellitus, GERD, asthma who presents to the emergency department from home via EMS Due to fall sustained at home.  Patient sustained a fall while walking in her hallway and landed on her right hip resulting in pain of the hip with difficulty in being able to get up from the floor.  EMS was activated and on arrival of EMS and patient was noted with tenderness of right hip and buttock, patient was complaining of pain rated as 10/10 on pain scale, morphine was given, pain is improved to 6/10 on pain scale, patient was taken to the ED for further evaluation and management.  Patient denies hitting her head or loss of consciousness, she denies chest pain, shortness of breath nausea or vomiting.   ED Course:  In the emergency department, she was tachycardic with HR of 101 bpm, other vital signs are within normal range.  Workup in the ED showed normal CBC except for WBC of 12.8, BMP was normal except for bicarb  of 20, blood glucose of 455 and creatinine of 1.15 Right hip x-ray showed acute right subcapital femoral neck fracture with override and varus angulation. Chest x-ray shows pulmonary hypoinflation IV fentanyl 50 mcg was given.  Orthopedic surgeon (Dr. Dallas Schimke) was consulted and will follow-up with patient in the morning.  IV cefazolin 2 g x 1 was given for surgical prophylaxis. Hospitalist was asked admit patient for further evaluation and management.  Assessment and Plan: 1)Rt Hip Closed Fx--- acute right subcapital femoral neck fracture with override and Varus angulation -Orthopedic consult appreciated -s/p  ORIF on 12/29/2022 -Further management per orthopedic team -Alternating oral oxycodone and IV fentanyl prn -Weightbearing: WBAT RLE; posterior hip precautions.  Incisional and dressing care: Reinforce dressings as needed Orthopedic device(s):  Hip abduction pillow in place at all times.  VTE prophylaxis: Resume Plavix POD#1 Pain control: As needed; judicious use of narcotics Follow - up plan: 2 weeks. -With orthopedics  -Given Xarelto 10 mg daily while in the hospital--while she is less mobile, please do not discharge on Xarelto -Patient will be on PTA Plavix per Ortho for DVT prophylaxis when she gets out of here to rehab where she will be more mobile   2)DM2-  -Resume home hypoglycemic regimen -Continue modified carbohydrate diet.  3)Leukocytosis--suspect this is reactive in the setting of fall/trauma with right hip fracture -WBC 12.8 in the setting of a stress demargination>>11.5 and trending down at discharge. -No signs of acute infection.   4) chronic anemia--- anticipate drop in H&H due to IV fluids/hemodilution and perioperative blood  loss -Hemoglobin currently greater than 11 which is close to prior baseline as noted back in 2020 and  2019. -Follow hemoglobin as an outpatient -No overt bleeding appreciated.   5)CKD stage -3A -Renal function appears to be essentially at  baseline. - renally adjust medications, avoid nephrotoxic agents / dehydration  / hypotension -Maintain adequate hydration.   6)HTN- -overall stable -Continue chronic home antihypertensive agents.   7)Parkinson's disease--- patient has a neurostimulator to help her Parkinson's symptoms -Continue Sinemet, continue current dose of propranolol to help with tremors -Continue outpatient follow-up with PCP/neurology service.   8)GERD--stable, continue PPI.   9)Depression/anxiety--stable, continue Lexapro -No suicidal ideation or hallucinations.   10)HLD-resume adjusted dose of a statin; follow heart healthy diet and maintain adequate hydration.  11-chronic history of asthma -No wheezing on exam. -Good saturation on room air -Resume as needed bronchodilator management -Continue the use of Flonase and Singulair.  Consultants: Orthopedic service. Procedures performed: Status post ORIF 12/29/2022; see below for x-ray reports. Disposition: Skilled nursing facility Diet recommendation: Heart healthy modified carbohydrate diet.  DISCHARGE MEDICATION: Allergies as of 12/31/2022       Reactions   Bupropion Other (See Comments)   Codeine Other (See Comments)   Jittery   Tape Other (See Comments)   Uncoded Allergy. Allergen: BANDAIDS   Trichlormethiazide Other (See Comments)   Penicillins Rash   Silver Rash   Patient has moderate blistering with tegaderm use   Sulfa Antibiotics Itching        Medication List     TAKE these medications    acetaminophen 500 MG tablet Commonly known as: TYLENOL Take 2 tablets (1,000 mg total) by mouth in the morning, at noon, and at bedtime.   albuterol 108 (90 Base) MCG/ACT inhaler Commonly known as: VENTOLIN HFA Inhale 1 puff into the lungs every 6 (six) hours as needed for wheezing or shortness of breath.   alendronate 70 MG tablet Commonly known as: FOSAMAX Take 70 mg by mouth once a week.   atorvastatin 80 MG tablet Commonly known  as: LIPITOR Take 0.5 tablets (40 mg total) by mouth daily. What changed: how much to take   carbidopa-levodopa 25-100 MG tablet Commonly known as: SINEMET IR Take 1 tablet by mouth 3 (three) times daily.   clopidogrel 75 MG tablet Commonly known as: PLAVIX Take 75 mg by mouth daily.   dicyclomine 10 MG capsule Commonly known as: BENTYL Take 1 capsule (10 mg total) by mouth 3 (three) times daily as needed for spasms (diarrhea).   doxepin 25 MG capsule Commonly known as: SINEQUAN Take 25 mg by mouth at bedtime.   escitalopram 20 MG tablet Commonly known as: LEXAPRO Take 20 mg by mouth daily.   ferrous sulfate 325 (65 FE) MG tablet Take 325 mg by mouth daily.   fluticasone 50 MCG/ACT nasal spray Commonly known as: FLONASE Place 2 sprays into both nostrils daily as needed for allergies or rhinitis.   glipiZIDE 5 MG 24 hr tablet Commonly known as: GLUCOTROL XL Take 5 mg by mouth every morning.   lisinopril 20 MG tablet Commonly known as: ZESTRIL Take 20 mg by mouth daily.   montelukast 10 MG tablet Commonly known as: SINGULAIR Take 10 mg by mouth daily.   omeprazole 20 MG capsule Commonly known as: PRILOSEC Take 20 mg by mouth daily. TAKE 1 CAPSULE (20 MG TOTAL) BY MOUTH DAILY.   oxyCODONE 5 MG immediate release tablet Commonly known as: Oxy IR/ROXICODONE Take 1-2 tablets (5-10 mg total) by  mouth every 6 (six) hours as needed for severe pain (pain score 7-10).   propranolol ER 80 MG 24 hr capsule Commonly known as: INDERAL LA Take 80 mg by mouth daily. For hypertension   topiramate 50 MG tablet Commonly known as: TOPAMAX Take 50 mg by mouth at bedtime.        Contact information for after-discharge care     Destination     [facility] .   Service: Skilled Nursing Contact information: [address] [place] [address] [phone]                    Discharge Exam: Ceasar Mons Weights   12/28/22 1639  12/29/22 1109  Weight: 60.3 kg 60.3 kg   General exam: Alert, awake, oriented x 3; good saturation on room air, no nausea, no vomiting, no chest pain. Respiratory system: Clear to auscultation. Respiratory effort normal.  No using accessory muscles. Cardiovascular system:RRR.  No rubs or gallops. Gastrointestinal system: Abdomen is nondistended, soft and nontender. No organomegaly or masses felt. Normal bowel sounds heard. Central nervous system: No focal neurological deficits. Extremities: No cyanosis or clubbing; clean dressings Skin: No petechiae. Psychiatry: Judgement and insight appear normal. Mood & affect appropriate.    Condition at discharge: Stable and improved.  The results of significant diagnostics from this hospitalization (including imaging, microbiology, ancillary and laboratory) are listed below for reference.   Imaging Studies: DG HIP UNILAT WITH PELVIS 2-3 VIEWS RIGHT  Result Date: 12/29/2022 CLINICAL DATA:  Status post right hip arthroplasty. EXAM: DG HIP (WITH OR WITHOUT PELVIS) 2-3V RIGHT COMPARISON:  December 28, 2022. FINDINGS: Right hip arthroplasty appears to be well situated. Expected postoperative changes are noted in the surrounding soft tissues. IMPRESSION: Status post right hip arthroplasty. Electronically Signed   By: Lupita Raider M.D.   On: 12/29/2022 18:22   DG Wrist 2 Views Right  Addendum Date: 12/29/2022   ADDENDUM REPORT: 12/29/2022 15:32 ADDENDUM: Flattening of the trapezium without discrete fracture lucency, of uncertain chronicity, favor chronic. Please correlate with directed clinical exam. These results will be called to the ordering clinician or representative by the Radiologist Assistant, and communication documented in the PACS or [employer]. Electronically Signed   By: Delbert Phenix M.D.   On: 12/29/2022 15:32   Result Date: 12/29/2022 CLINICAL DATA:  Fall, right wrist pain EXAM: RIGHT WRIST - 2 VIEW COMPARISON:  None Available.  FINDINGS: No right wrist fracture or dislocation. No suspicious focal osseous lesions. Mild first carpometacarpal joint osteoarthritis. No radiopaque foreign bodies. IMPRESSION: No right wrist fracture or dislocation. Mild first carpometacarpal joint osteoarthritis. Electronically Signed: By: Delbert Phenix M.D. On: 12/29/2022 12:32   DG Chest 1 View  Result Date: 12/28/2022 CLINICAL DATA:  Fall EXAM: CHEST  1 VIEW COMPARISON:  None Available. FINDINGS: Lungs volumes are small, but are symmetric and are clear. No pneumothorax or pleural effusion. Cardiac size within normal limits. Pulmonary vascularity is normal. Osseous structures are age-appropriate. No acute bone abnormality. IMPRESSION: 1. Pulmonary hypoinflation. Electronically Signed   By: Helyn Numbers M.D.   On: 12/28/2022 19:03   DG Hip Unilat With Pelvis 2-3 Views Right  Result Date: 12/28/2022 CLINICAL DATA:  Fall, right leg deformed EXAM: DG HIP (WITH OR WITHOUT PELVIS) 2-3V RIGHT COMPARISON:  None Available. FINDINGS: There is an acute right subcapital femoral neck fracture with override and external rotation of the distal fracture fragment with resultant moderate varus angulation. Femoral head is still  seated within the right acetabulum. Hip joint spaces are preserved. Visualized pelvis and left hip are intact. Neurostimulator device overlies the right iliac wing. Soft tissues are unremarkable. IMPRESSION: 1. Acute right subcapital femoral neck fracture with override and varus angulation. Electronically Signed   By: Helyn Numbers M.D.   On: 12/28/2022 19:03    Microbiology: Results for orders placed or performed during the hospital encounter of 12/28/22  MRSA Next Gen by PCR, Nasal     Status: None   Collection Time: 12/29/22 11:15 AM   Specimen: Nasal Mucosa; Nasal Swab  Result Value Ref Range Status   MRSA by PCR Next Gen NOT DETECTED NOT DETECTED Final    Comment: (NOTE) The GeneXpert MRSA Assay (FDA approved for NASAL specimens  only), is one component of a comprehensive MRSA colonization surveillance program. It is not intended to diagnose MRSA infection nor to guide or monitor treatment for MRSA infections. Test performance is not FDA approved in patients less than 62 years old. Performed at [facility], [address] [place], [place] [address]     Labs: CBC: Recent Labs  Lab 12/28/22 1722 12/29/22 0442 12/31/22 0411  WBC 12.8* 11.5* 12.1*  NEUTROABS 10.2*  --   --   HGB 12.0 11.5* 9.6*  HCT 38.6 37.1 31.0*  MCV 89.6 90.3 87.8  PLT 220 201 168   Basic Metabolic Panel: Recent Labs  Lab 12/28/22 1722 12/29/22 0442 12/31/22 0411  NA 136 136 132*  K 4.2 4.2 4.1  CL 108 104 99  CO2 20* 24 25  GLUCOSE 155* 192* 150*  BUN 16 19 15   CREATININE 1.15* 1.08* 1.00  CALCIUM 8.6* 8.7* 8.2*  MG  --  2.1  --   PHOS  --  3.3  --    Liver Function Tests: Recent Labs  Lab 12/29/22 0442  AST 49*  ALT 23  ALKPHOS 58  BILITOT 0.4  PROT 6.6  ALBUMIN 3.3*   CBG: Recent Labs  Lab 12/30/22 1125 12/30/22 1623 12/30/22 1954 12/31/22 0826 12/31/22 1123  GLUCAP 184* 213* 183* 155* 158*    Discharge time spent: greater than 30 minutes.  Signed: Vassie Loll, MD [facility] 12/31/2022

## 2022-12-31 NOTE — Progress Notes (Signed)
Spoke with Beth Young at [facility] to provide report.

## 2023-01-01 DIAGNOSIS — I10 Essential (primary) hypertension: Secondary | ICD-10-CM | POA: Diagnosis not present

## 2023-01-01 DIAGNOSIS — S72001D Fracture of unspecified part of neck of right femur, subsequent encounter for closed fracture with routine healing: Secondary | ICD-10-CM | POA: Diagnosis not present

## 2023-01-01 DIAGNOSIS — K219 Gastro-esophageal reflux disease without esophagitis: Secondary | ICD-10-CM | POA: Diagnosis not present

## 2023-01-01 DIAGNOSIS — M810 Age-related osteoporosis without current pathological fracture: Secondary | ICD-10-CM | POA: Diagnosis not present

## 2023-01-01 DIAGNOSIS — G250 Essential tremor: Secondary | ICD-10-CM | POA: Diagnosis not present

## 2023-01-01 DIAGNOSIS — Z471 Aftercare following joint replacement surgery: Secondary | ICD-10-CM | POA: Diagnosis not present

## 2023-01-01 DIAGNOSIS — E119 Type 2 diabetes mellitus without complications: Secondary | ICD-10-CM | POA: Diagnosis not present

## 2023-01-02 DIAGNOSIS — D649 Anemia, unspecified: Secondary | ICD-10-CM | POA: Diagnosis not present

## 2023-01-02 DIAGNOSIS — E119 Type 2 diabetes mellitus without complications: Secondary | ICD-10-CM | POA: Diagnosis not present

## 2023-01-02 DIAGNOSIS — E559 Vitamin D deficiency, unspecified: Secondary | ICD-10-CM | POA: Diagnosis not present

## 2023-01-02 DIAGNOSIS — E871 Hypo-osmolality and hyponatremia: Secondary | ICD-10-CM | POA: Diagnosis not present

## 2023-01-05 DIAGNOSIS — R5381 Other malaise: Secondary | ICD-10-CM | POA: Diagnosis not present

## 2023-01-05 DIAGNOSIS — S72001A Fracture of unspecified part of neck of right femur, initial encounter for closed fracture: Secondary | ICD-10-CM | POA: Diagnosis not present

## 2023-01-13 ENCOUNTER — Other Ambulatory Visit (INDEPENDENT_AMBULATORY_CARE_PROVIDER_SITE_OTHER): Payer: 59

## 2023-01-13 ENCOUNTER — Ambulatory Visit (INDEPENDENT_AMBULATORY_CARE_PROVIDER_SITE_OTHER): Payer: 59 | Admitting: Orthopedic Surgery

## 2023-01-13 VITALS — BP 150/80 | HR 98

## 2023-01-13 DIAGNOSIS — S72001D Fracture of unspecified part of neck of right femur, subsequent encounter for closed fracture with routine healing: Secondary | ICD-10-CM | POA: Diagnosis not present

## 2023-01-13 DIAGNOSIS — Z96641 Presence of right artificial hip joint: Secondary | ICD-10-CM

## 2023-01-13 DIAGNOSIS — Z96649 Presence of unspecified artificial hip joint: Secondary | ICD-10-CM

## 2023-01-13 NOTE — Progress Notes (Unsigned)
Orthopaedic Postop Note  Assessment: Beth Young is a 77 y.o. female s/p {right/left/bilateral:24895} hip hemiarthroplasty  DOS: ***  Plan: Sutures trimmed, steri strips placed Continue using hip abduction pillow while in bed until 6 weeks after surgery Continue posterior hip precautions until 3 months postop Continue with DVT prophylaxis for at least 6 weeks after surgery WBAT on the operative extremity Follow up in 4 weeks; call with any issues  No orders of the defined types were placed in this encounter.    Follow-up: No follow-ups on file. XR at next visit: AP pelvis and {right/left/bilateral:24895} hip  Subjective:  No chief complaint on file.   History of Present Illness: Beth Young is a 77 y.o. female who presents following the above stated procedure.  Review of Systems: No fevers or chills No numbness or tingling No Chest Pain No shortness of breath***   Objective: BP (!) 150/80   Pulse 98   Physical Exam:  ***  IMAGING: I personally ordered and reviewed the following images:  XR of the {right/left/bilateral:24895} hip and AP pelvis demonstrates a cemented*** hip arthroplasty in good position.  The hip remains reduced.  There is no evidence of implant subsidence.  No acute fractures are noted.  Impression: {right/left/bilateral:24895} hip hemiarthroplasty in stable position, without evidence of migration or subsidence compared to prior XR   Oliver Barre, MD 01/13/2023 3:55 PM

## 2023-01-14 ENCOUNTER — Encounter: Payer: Self-pay | Admitting: Orthopedic Surgery

## 2023-01-14 DIAGNOSIS — S72001D Fracture of unspecified part of neck of right femur, subsequent encounter for closed fracture with routine healing: Secondary | ICD-10-CM | POA: Diagnosis not present

## 2023-01-14 DIAGNOSIS — K219 Gastro-esophageal reflux disease without esophagitis: Secondary | ICD-10-CM | POA: Diagnosis not present

## 2023-01-14 DIAGNOSIS — G250 Essential tremor: Secondary | ICD-10-CM | POA: Diagnosis not present

## 2023-01-14 DIAGNOSIS — Z471 Aftercare following joint replacement surgery: Secondary | ICD-10-CM | POA: Diagnosis not present

## 2023-01-14 DIAGNOSIS — I69959 Hemiplegia and hemiparesis following unspecified cerebrovascular disease affecting unspecified side: Secondary | ICD-10-CM | POA: Diagnosis not present

## 2023-01-14 DIAGNOSIS — E785 Hyperlipidemia, unspecified: Secondary | ICD-10-CM | POA: Diagnosis not present

## 2023-01-14 DIAGNOSIS — S72001A Fracture of unspecified part of neck of right femur, initial encounter for closed fracture: Secondary | ICD-10-CM | POA: Diagnosis not present

## 2023-01-14 DIAGNOSIS — G8929 Other chronic pain: Secondary | ICD-10-CM | POA: Diagnosis not present

## 2023-01-14 DIAGNOSIS — I10 Essential (primary) hypertension: Secondary | ICD-10-CM | POA: Diagnosis not present

## 2023-01-14 DIAGNOSIS — M810 Age-related osteoporosis without current pathological fracture: Secondary | ICD-10-CM | POA: Diagnosis not present

## 2023-01-14 DIAGNOSIS — M5030 Other cervical disc degeneration, unspecified cervical region: Secondary | ICD-10-CM | POA: Diagnosis not present

## 2023-01-19 ENCOUNTER — Telehealth: Payer: Self-pay | Admitting: Orthopedic Surgery

## 2023-01-19 NOTE — Telephone Encounter (Signed)
Returned a call to [phone] to move this pt's appt to a Friday.  LVM for them to call me back.

## 2023-01-20 DIAGNOSIS — I10 Essential (primary) hypertension: Secondary | ICD-10-CM | POA: Diagnosis not present

## 2023-01-20 DIAGNOSIS — E785 Hyperlipidemia, unspecified: Secondary | ICD-10-CM | POA: Diagnosis not present

## 2023-01-20 DIAGNOSIS — M6281 Muscle weakness (generalized): Secondary | ICD-10-CM | POA: Diagnosis not present

## 2023-01-20 DIAGNOSIS — R262 Difficulty in walking, not elsewhere classified: Secondary | ICD-10-CM | POA: Diagnosis not present

## 2023-01-20 DIAGNOSIS — E119 Type 2 diabetes mellitus without complications: Secondary | ICD-10-CM | POA: Diagnosis not present

## 2023-01-22 DIAGNOSIS — D72829 Elevated white blood cell count, unspecified: Secondary | ICD-10-CM | POA: Diagnosis not present

## 2023-01-22 DIAGNOSIS — Z7984 Long term (current) use of oral hypoglycemic drugs: Secondary | ICD-10-CM | POA: Diagnosis not present

## 2023-01-22 DIAGNOSIS — G4700 Insomnia, unspecified: Secondary | ICD-10-CM | POA: Diagnosis not present

## 2023-01-22 DIAGNOSIS — E860 Dehydration: Secondary | ICD-10-CM | POA: Diagnosis not present

## 2023-01-22 DIAGNOSIS — M80051D Age-related osteoporosis with current pathological fracture, right femur, subsequent encounter for fracture with routine healing: Secondary | ICD-10-CM | POA: Diagnosis not present

## 2023-01-22 DIAGNOSIS — Z87891 Personal history of nicotine dependence: Secondary | ICD-10-CM | POA: Diagnosis not present

## 2023-01-22 DIAGNOSIS — J45909 Unspecified asthma, uncomplicated: Secondary | ICD-10-CM | POA: Diagnosis not present

## 2023-01-22 DIAGNOSIS — E1165 Type 2 diabetes mellitus with hyperglycemia: Secondary | ICD-10-CM | POA: Diagnosis not present

## 2023-01-22 DIAGNOSIS — Z7902 Long term (current) use of antithrombotics/antiplatelets: Secondary | ICD-10-CM | POA: Diagnosis not present

## 2023-01-22 DIAGNOSIS — E782 Mixed hyperlipidemia: Secondary | ICD-10-CM | POA: Diagnosis not present

## 2023-01-22 DIAGNOSIS — I129 Hypertensive chronic kidney disease with stage 1 through stage 4 chronic kidney disease, or unspecified chronic kidney disease: Secondary | ICD-10-CM | POA: Diagnosis not present

## 2023-01-22 DIAGNOSIS — K219 Gastro-esophageal reflux disease without esophagitis: Secondary | ICD-10-CM | POA: Diagnosis not present

## 2023-01-22 DIAGNOSIS — Z9181 History of falling: Secondary | ICD-10-CM | POA: Diagnosis not present

## 2023-01-22 DIAGNOSIS — G250 Essential tremor: Secondary | ICD-10-CM | POA: Diagnosis not present

## 2023-01-22 DIAGNOSIS — G20A1 Parkinson's disease without dyskinesia, without mention of fluctuations: Secondary | ICD-10-CM | POA: Diagnosis not present

## 2023-01-22 DIAGNOSIS — M1990 Unspecified osteoarthritis, unspecified site: Secondary | ICD-10-CM | POA: Diagnosis not present

## 2023-01-22 DIAGNOSIS — D631 Anemia in chronic kidney disease: Secondary | ICD-10-CM | POA: Diagnosis not present

## 2023-01-22 DIAGNOSIS — Z8673 Personal history of transient ischemic attack (TIA), and cerebral infarction without residual deficits: Secondary | ICD-10-CM | POA: Diagnosis not present

## 2023-01-22 DIAGNOSIS — E1122 Type 2 diabetes mellitus with diabetic chronic kidney disease: Secondary | ICD-10-CM | POA: Diagnosis not present

## 2023-01-22 DIAGNOSIS — Z96641 Presence of right artificial hip joint: Secondary | ICD-10-CM | POA: Diagnosis not present

## 2023-01-22 DIAGNOSIS — G2581 Restless legs syndrome: Secondary | ICD-10-CM | POA: Diagnosis not present

## 2023-01-22 DIAGNOSIS — N1831 Chronic kidney disease, stage 3a: Secondary | ICD-10-CM | POA: Diagnosis not present

## 2023-01-23 DIAGNOSIS — Z23 Encounter for immunization: Secondary | ICD-10-CM | POA: Diagnosis not present

## 2023-01-27 DIAGNOSIS — M80051D Age-related osteoporosis with current pathological fracture, right femur, subsequent encounter for fracture with routine healing: Secondary | ICD-10-CM | POA: Diagnosis not present

## 2023-01-27 DIAGNOSIS — Z96641 Presence of right artificial hip joint: Secondary | ICD-10-CM | POA: Diagnosis not present

## 2023-01-27 DIAGNOSIS — I129 Hypertensive chronic kidney disease with stage 1 through stage 4 chronic kidney disease, or unspecified chronic kidney disease: Secondary | ICD-10-CM | POA: Diagnosis not present

## 2023-01-27 DIAGNOSIS — E860 Dehydration: Secondary | ICD-10-CM | POA: Diagnosis not present

## 2023-01-27 DIAGNOSIS — Z7984 Long term (current) use of oral hypoglycemic drugs: Secondary | ICD-10-CM | POA: Diagnosis not present

## 2023-01-27 DIAGNOSIS — G4700 Insomnia, unspecified: Secondary | ICD-10-CM | POA: Diagnosis not present

## 2023-01-27 DIAGNOSIS — E1165 Type 2 diabetes mellitus with hyperglycemia: Secondary | ICD-10-CM | POA: Diagnosis not present

## 2023-01-27 DIAGNOSIS — E782 Mixed hyperlipidemia: Secondary | ICD-10-CM | POA: Diagnosis not present

## 2023-01-27 DIAGNOSIS — Z9181 History of falling: Secondary | ICD-10-CM | POA: Diagnosis not present

## 2023-01-27 DIAGNOSIS — E1122 Type 2 diabetes mellitus with diabetic chronic kidney disease: Secondary | ICD-10-CM | POA: Diagnosis not present

## 2023-01-27 DIAGNOSIS — J45909 Unspecified asthma, uncomplicated: Secondary | ICD-10-CM | POA: Diagnosis not present

## 2023-01-27 DIAGNOSIS — M1990 Unspecified osteoarthritis, unspecified site: Secondary | ICD-10-CM | POA: Diagnosis not present

## 2023-01-27 DIAGNOSIS — Z87891 Personal history of nicotine dependence: Secondary | ICD-10-CM | POA: Diagnosis not present

## 2023-01-27 DIAGNOSIS — G2581 Restless legs syndrome: Secondary | ICD-10-CM | POA: Diagnosis not present

## 2023-01-27 DIAGNOSIS — K219 Gastro-esophageal reflux disease without esophagitis: Secondary | ICD-10-CM | POA: Diagnosis not present

## 2023-01-27 DIAGNOSIS — G250 Essential tremor: Secondary | ICD-10-CM | POA: Diagnosis not present

## 2023-01-27 DIAGNOSIS — Z8673 Personal history of transient ischemic attack (TIA), and cerebral infarction without residual deficits: Secondary | ICD-10-CM | POA: Diagnosis not present

## 2023-01-27 DIAGNOSIS — N1831 Chronic kidney disease, stage 3a: Secondary | ICD-10-CM | POA: Diagnosis not present

## 2023-01-27 DIAGNOSIS — D72829 Elevated white blood cell count, unspecified: Secondary | ICD-10-CM | POA: Diagnosis not present

## 2023-01-27 DIAGNOSIS — G20A1 Parkinson's disease without dyskinesia, without mention of fluctuations: Secondary | ICD-10-CM | POA: Diagnosis not present

## 2023-01-27 DIAGNOSIS — Z7902 Long term (current) use of antithrombotics/antiplatelets: Secondary | ICD-10-CM | POA: Diagnosis not present

## 2023-01-27 DIAGNOSIS — D631 Anemia in chronic kidney disease: Secondary | ICD-10-CM | POA: Diagnosis not present

## 2023-01-30 DIAGNOSIS — G20A1 Parkinson's disease without dyskinesia, without mention of fluctuations: Secondary | ICD-10-CM | POA: Diagnosis not present

## 2023-01-30 DIAGNOSIS — Z7902 Long term (current) use of antithrombotics/antiplatelets: Secondary | ICD-10-CM | POA: Diagnosis not present

## 2023-01-30 DIAGNOSIS — J45909 Unspecified asthma, uncomplicated: Secondary | ICD-10-CM | POA: Diagnosis not present

## 2023-01-30 DIAGNOSIS — E1165 Type 2 diabetes mellitus with hyperglycemia: Secondary | ICD-10-CM | POA: Diagnosis not present

## 2023-01-30 DIAGNOSIS — Z9181 History of falling: Secondary | ICD-10-CM | POA: Diagnosis not present

## 2023-01-30 DIAGNOSIS — G2581 Restless legs syndrome: Secondary | ICD-10-CM | POA: Diagnosis not present

## 2023-01-30 DIAGNOSIS — K219 Gastro-esophageal reflux disease without esophagitis: Secondary | ICD-10-CM | POA: Diagnosis not present

## 2023-01-30 DIAGNOSIS — Z87891 Personal history of nicotine dependence: Secondary | ICD-10-CM | POA: Diagnosis not present

## 2023-01-30 DIAGNOSIS — G250 Essential tremor: Secondary | ICD-10-CM | POA: Diagnosis not present

## 2023-01-30 DIAGNOSIS — D72829 Elevated white blood cell count, unspecified: Secondary | ICD-10-CM | POA: Diagnosis not present

## 2023-01-30 DIAGNOSIS — N1831 Chronic kidney disease, stage 3a: Secondary | ICD-10-CM | POA: Diagnosis not present

## 2023-01-30 DIAGNOSIS — G4700 Insomnia, unspecified: Secondary | ICD-10-CM | POA: Diagnosis not present

## 2023-01-30 DIAGNOSIS — D631 Anemia in chronic kidney disease: Secondary | ICD-10-CM | POA: Diagnosis not present

## 2023-01-30 DIAGNOSIS — I129 Hypertensive chronic kidney disease with stage 1 through stage 4 chronic kidney disease, or unspecified chronic kidney disease: Secondary | ICD-10-CM | POA: Diagnosis not present

## 2023-01-30 DIAGNOSIS — Z8673 Personal history of transient ischemic attack (TIA), and cerebral infarction without residual deficits: Secondary | ICD-10-CM | POA: Diagnosis not present

## 2023-01-30 DIAGNOSIS — M1990 Unspecified osteoarthritis, unspecified site: Secondary | ICD-10-CM | POA: Diagnosis not present

## 2023-01-30 DIAGNOSIS — E1122 Type 2 diabetes mellitus with diabetic chronic kidney disease: Secondary | ICD-10-CM | POA: Diagnosis not present

## 2023-01-30 DIAGNOSIS — M80051D Age-related osteoporosis with current pathological fracture, right femur, subsequent encounter for fracture with routine healing: Secondary | ICD-10-CM | POA: Diagnosis not present

## 2023-01-30 DIAGNOSIS — E860 Dehydration: Secondary | ICD-10-CM | POA: Diagnosis not present

## 2023-01-30 DIAGNOSIS — E782 Mixed hyperlipidemia: Secondary | ICD-10-CM | POA: Diagnosis not present

## 2023-01-30 DIAGNOSIS — Z96641 Presence of right artificial hip joint: Secondary | ICD-10-CM | POA: Diagnosis not present

## 2023-01-30 DIAGNOSIS — Z7984 Long term (current) use of oral hypoglycemic drugs: Secondary | ICD-10-CM | POA: Diagnosis not present

## 2023-02-09 DIAGNOSIS — K219 Gastro-esophageal reflux disease without esophagitis: Secondary | ICD-10-CM | POA: Diagnosis not present

## 2023-02-09 DIAGNOSIS — G20A1 Parkinson's disease without dyskinesia, without mention of fluctuations: Secondary | ICD-10-CM | POA: Diagnosis not present

## 2023-02-09 DIAGNOSIS — Z7984 Long term (current) use of oral hypoglycemic drugs: Secondary | ICD-10-CM | POA: Diagnosis not present

## 2023-02-09 DIAGNOSIS — I129 Hypertensive chronic kidney disease with stage 1 through stage 4 chronic kidney disease, or unspecified chronic kidney disease: Secondary | ICD-10-CM | POA: Diagnosis not present

## 2023-02-09 DIAGNOSIS — N1831 Chronic kidney disease, stage 3a: Secondary | ICD-10-CM | POA: Diagnosis not present

## 2023-02-09 DIAGNOSIS — G250 Essential tremor: Secondary | ICD-10-CM | POA: Diagnosis not present

## 2023-02-09 DIAGNOSIS — G2581 Restless legs syndrome: Secondary | ICD-10-CM | POA: Diagnosis not present

## 2023-02-09 DIAGNOSIS — E1165 Type 2 diabetes mellitus with hyperglycemia: Secondary | ICD-10-CM | POA: Diagnosis not present

## 2023-02-09 DIAGNOSIS — E1122 Type 2 diabetes mellitus with diabetic chronic kidney disease: Secondary | ICD-10-CM | POA: Diagnosis not present

## 2023-02-09 DIAGNOSIS — D631 Anemia in chronic kidney disease: Secondary | ICD-10-CM | POA: Diagnosis not present

## 2023-02-09 DIAGNOSIS — Z96641 Presence of right artificial hip joint: Secondary | ICD-10-CM | POA: Diagnosis not present

## 2023-02-09 DIAGNOSIS — E782 Mixed hyperlipidemia: Secondary | ICD-10-CM | POA: Diagnosis not present

## 2023-02-09 DIAGNOSIS — Z8673 Personal history of transient ischemic attack (TIA), and cerebral infarction without residual deficits: Secondary | ICD-10-CM | POA: Diagnosis not present

## 2023-02-09 DIAGNOSIS — Z9181 History of falling: Secondary | ICD-10-CM | POA: Diagnosis not present

## 2023-02-09 DIAGNOSIS — M1990 Unspecified osteoarthritis, unspecified site: Secondary | ICD-10-CM | POA: Diagnosis not present

## 2023-02-09 DIAGNOSIS — M80051D Age-related osteoporosis with current pathological fracture, right femur, subsequent encounter for fracture with routine healing: Secondary | ICD-10-CM | POA: Diagnosis not present

## 2023-02-09 DIAGNOSIS — Z87891 Personal history of nicotine dependence: Secondary | ICD-10-CM | POA: Diagnosis not present

## 2023-02-09 DIAGNOSIS — J45909 Unspecified asthma, uncomplicated: Secondary | ICD-10-CM | POA: Diagnosis not present

## 2023-02-09 DIAGNOSIS — E860 Dehydration: Secondary | ICD-10-CM | POA: Diagnosis not present

## 2023-02-09 DIAGNOSIS — G4700 Insomnia, unspecified: Secondary | ICD-10-CM | POA: Diagnosis not present

## 2023-02-09 DIAGNOSIS — Z7902 Long term (current) use of antithrombotics/antiplatelets: Secondary | ICD-10-CM | POA: Diagnosis not present

## 2023-02-09 DIAGNOSIS — D72829 Elevated white blood cell count, unspecified: Secondary | ICD-10-CM | POA: Diagnosis not present

## 2023-02-10 ENCOUNTER — Encounter: Payer: 59 | Admitting: Orthopedic Surgery

## 2023-02-11 DIAGNOSIS — G4700 Insomnia, unspecified: Secondary | ICD-10-CM | POA: Diagnosis not present

## 2023-02-11 DIAGNOSIS — M1990 Unspecified osteoarthritis, unspecified site: Secondary | ICD-10-CM | POA: Diagnosis not present

## 2023-02-11 DIAGNOSIS — D631 Anemia in chronic kidney disease: Secondary | ICD-10-CM | POA: Diagnosis not present

## 2023-02-11 DIAGNOSIS — G20A1 Parkinson's disease without dyskinesia, without mention of fluctuations: Secondary | ICD-10-CM | POA: Diagnosis not present

## 2023-02-11 DIAGNOSIS — Z87891 Personal history of nicotine dependence: Secondary | ICD-10-CM | POA: Diagnosis not present

## 2023-02-11 DIAGNOSIS — E1122 Type 2 diabetes mellitus with diabetic chronic kidney disease: Secondary | ICD-10-CM | POA: Diagnosis not present

## 2023-02-11 DIAGNOSIS — E860 Dehydration: Secondary | ICD-10-CM | POA: Diagnosis not present

## 2023-02-11 DIAGNOSIS — Z7984 Long term (current) use of oral hypoglycemic drugs: Secondary | ICD-10-CM | POA: Diagnosis not present

## 2023-02-11 DIAGNOSIS — I129 Hypertensive chronic kidney disease with stage 1 through stage 4 chronic kidney disease, or unspecified chronic kidney disease: Secondary | ICD-10-CM | POA: Diagnosis not present

## 2023-02-11 DIAGNOSIS — E1165 Type 2 diabetes mellitus with hyperglycemia: Secondary | ICD-10-CM | POA: Diagnosis not present

## 2023-02-11 DIAGNOSIS — Z9181 History of falling: Secondary | ICD-10-CM | POA: Diagnosis not present

## 2023-02-11 DIAGNOSIS — G250 Essential tremor: Secondary | ICD-10-CM | POA: Diagnosis not present

## 2023-02-11 DIAGNOSIS — K219 Gastro-esophageal reflux disease without esophagitis: Secondary | ICD-10-CM | POA: Diagnosis not present

## 2023-02-11 DIAGNOSIS — Z7902 Long term (current) use of antithrombotics/antiplatelets: Secondary | ICD-10-CM | POA: Diagnosis not present

## 2023-02-11 DIAGNOSIS — Z96641 Presence of right artificial hip joint: Secondary | ICD-10-CM | POA: Diagnosis not present

## 2023-02-11 DIAGNOSIS — M80051D Age-related osteoporosis with current pathological fracture, right femur, subsequent encounter for fracture with routine healing: Secondary | ICD-10-CM | POA: Diagnosis not present

## 2023-02-11 DIAGNOSIS — G2581 Restless legs syndrome: Secondary | ICD-10-CM | POA: Diagnosis not present

## 2023-02-11 DIAGNOSIS — E782 Mixed hyperlipidemia: Secondary | ICD-10-CM | POA: Diagnosis not present

## 2023-02-11 DIAGNOSIS — D72829 Elevated white blood cell count, unspecified: Secondary | ICD-10-CM | POA: Diagnosis not present

## 2023-02-11 DIAGNOSIS — J45909 Unspecified asthma, uncomplicated: Secondary | ICD-10-CM | POA: Diagnosis not present

## 2023-02-11 DIAGNOSIS — N1831 Chronic kidney disease, stage 3a: Secondary | ICD-10-CM | POA: Diagnosis not present

## 2023-02-11 DIAGNOSIS — Z8673 Personal history of transient ischemic attack (TIA), and cerebral infarction without residual deficits: Secondary | ICD-10-CM | POA: Diagnosis not present

## 2023-02-12 DIAGNOSIS — Z87891 Personal history of nicotine dependence: Secondary | ICD-10-CM | POA: Diagnosis not present

## 2023-02-12 DIAGNOSIS — G20A1 Parkinson's disease without dyskinesia, without mention of fluctuations: Secondary | ICD-10-CM | POA: Diagnosis not present

## 2023-02-12 DIAGNOSIS — I129 Hypertensive chronic kidney disease with stage 1 through stage 4 chronic kidney disease, or unspecified chronic kidney disease: Secondary | ICD-10-CM | POA: Diagnosis not present

## 2023-02-12 DIAGNOSIS — Z96641 Presence of right artificial hip joint: Secondary | ICD-10-CM | POA: Diagnosis not present

## 2023-02-12 DIAGNOSIS — Z8673 Personal history of transient ischemic attack (TIA), and cerebral infarction without residual deficits: Secondary | ICD-10-CM | POA: Diagnosis not present

## 2023-02-12 DIAGNOSIS — G250 Essential tremor: Secondary | ICD-10-CM | POA: Diagnosis not present

## 2023-02-12 DIAGNOSIS — M80051D Age-related osteoporosis with current pathological fracture, right femur, subsequent encounter for fracture with routine healing: Secondary | ICD-10-CM | POA: Diagnosis not present

## 2023-02-12 DIAGNOSIS — Z9181 History of falling: Secondary | ICD-10-CM | POA: Diagnosis not present

## 2023-02-12 DIAGNOSIS — Z7902 Long term (current) use of antithrombotics/antiplatelets: Secondary | ICD-10-CM | POA: Diagnosis not present

## 2023-02-12 DIAGNOSIS — D631 Anemia in chronic kidney disease: Secondary | ICD-10-CM | POA: Diagnosis not present

## 2023-02-12 DIAGNOSIS — M1990 Unspecified osteoarthritis, unspecified site: Secondary | ICD-10-CM | POA: Diagnosis not present

## 2023-02-12 DIAGNOSIS — E1165 Type 2 diabetes mellitus with hyperglycemia: Secondary | ICD-10-CM | POA: Diagnosis not present

## 2023-02-12 DIAGNOSIS — K219 Gastro-esophageal reflux disease without esophagitis: Secondary | ICD-10-CM | POA: Diagnosis not present

## 2023-02-12 DIAGNOSIS — J45909 Unspecified asthma, uncomplicated: Secondary | ICD-10-CM | POA: Diagnosis not present

## 2023-02-12 DIAGNOSIS — N1831 Chronic kidney disease, stage 3a: Secondary | ICD-10-CM | POA: Diagnosis not present

## 2023-02-12 DIAGNOSIS — E1122 Type 2 diabetes mellitus with diabetic chronic kidney disease: Secondary | ICD-10-CM | POA: Diagnosis not present

## 2023-02-12 DIAGNOSIS — E782 Mixed hyperlipidemia: Secondary | ICD-10-CM | POA: Diagnosis not present

## 2023-02-12 DIAGNOSIS — G4700 Insomnia, unspecified: Secondary | ICD-10-CM | POA: Diagnosis not present

## 2023-02-12 DIAGNOSIS — D72829 Elevated white blood cell count, unspecified: Secondary | ICD-10-CM | POA: Diagnosis not present

## 2023-02-12 DIAGNOSIS — E860 Dehydration: Secondary | ICD-10-CM | POA: Diagnosis not present

## 2023-02-12 DIAGNOSIS — G2581 Restless legs syndrome: Secondary | ICD-10-CM | POA: Diagnosis not present

## 2023-02-12 DIAGNOSIS — Z7984 Long term (current) use of oral hypoglycemic drugs: Secondary | ICD-10-CM | POA: Diagnosis not present

## 2023-02-15 DIAGNOSIS — G20A1 Parkinson's disease without dyskinesia, without mention of fluctuations: Secondary | ICD-10-CM | POA: Diagnosis not present

## 2023-02-15 DIAGNOSIS — K580 Irritable bowel syndrome with diarrhea: Secondary | ICD-10-CM | POA: Diagnosis not present

## 2023-02-20 ENCOUNTER — Other Ambulatory Visit (INDEPENDENT_AMBULATORY_CARE_PROVIDER_SITE_OTHER): Payer: 59

## 2023-02-20 ENCOUNTER — Ambulatory Visit (INDEPENDENT_AMBULATORY_CARE_PROVIDER_SITE_OTHER): Payer: 59 | Admitting: Orthopedic Surgery

## 2023-02-20 ENCOUNTER — Encounter: Payer: Self-pay | Admitting: Orthopedic Surgery

## 2023-02-20 DIAGNOSIS — E860 Dehydration: Secondary | ICD-10-CM | POA: Diagnosis not present

## 2023-02-20 DIAGNOSIS — G2581 Restless legs syndrome: Secondary | ICD-10-CM | POA: Diagnosis not present

## 2023-02-20 DIAGNOSIS — D72829 Elevated white blood cell count, unspecified: Secondary | ICD-10-CM | POA: Diagnosis not present

## 2023-02-20 DIAGNOSIS — Z96649 Presence of unspecified artificial hip joint: Secondary | ICD-10-CM | POA: Diagnosis not present

## 2023-02-20 DIAGNOSIS — M80051D Age-related osteoporosis with current pathological fracture, right femur, subsequent encounter for fracture with routine healing: Secondary | ICD-10-CM | POA: Diagnosis not present

## 2023-02-20 DIAGNOSIS — Z9181 History of falling: Secondary | ICD-10-CM | POA: Diagnosis not present

## 2023-02-20 DIAGNOSIS — K219 Gastro-esophageal reflux disease without esophagitis: Secondary | ICD-10-CM | POA: Diagnosis not present

## 2023-02-20 DIAGNOSIS — E782 Mixed hyperlipidemia: Secondary | ICD-10-CM | POA: Diagnosis not present

## 2023-02-20 DIAGNOSIS — Z96641 Presence of right artificial hip joint: Secondary | ICD-10-CM | POA: Diagnosis not present

## 2023-02-20 DIAGNOSIS — N1831 Chronic kidney disease, stage 3a: Secondary | ICD-10-CM | POA: Diagnosis not present

## 2023-02-20 DIAGNOSIS — Z8673 Personal history of transient ischemic attack (TIA), and cerebral infarction without residual deficits: Secondary | ICD-10-CM | POA: Diagnosis not present

## 2023-02-20 DIAGNOSIS — G4700 Insomnia, unspecified: Secondary | ICD-10-CM | POA: Diagnosis not present

## 2023-02-20 DIAGNOSIS — E1122 Type 2 diabetes mellitus with diabetic chronic kidney disease: Secondary | ICD-10-CM | POA: Diagnosis not present

## 2023-02-20 DIAGNOSIS — Z87891 Personal history of nicotine dependence: Secondary | ICD-10-CM | POA: Diagnosis not present

## 2023-02-20 DIAGNOSIS — D631 Anemia in chronic kidney disease: Secondary | ICD-10-CM | POA: Diagnosis not present

## 2023-02-20 DIAGNOSIS — G250 Essential tremor: Secondary | ICD-10-CM | POA: Diagnosis not present

## 2023-02-20 DIAGNOSIS — Z7984 Long term (current) use of oral hypoglycemic drugs: Secondary | ICD-10-CM | POA: Diagnosis not present

## 2023-02-20 DIAGNOSIS — J45909 Unspecified asthma, uncomplicated: Secondary | ICD-10-CM | POA: Diagnosis not present

## 2023-02-20 DIAGNOSIS — I129 Hypertensive chronic kidney disease with stage 1 through stage 4 chronic kidney disease, or unspecified chronic kidney disease: Secondary | ICD-10-CM | POA: Diagnosis not present

## 2023-02-20 DIAGNOSIS — Z7902 Long term (current) use of antithrombotics/antiplatelets: Secondary | ICD-10-CM | POA: Diagnosis not present

## 2023-02-20 DIAGNOSIS — G20A1 Parkinson's disease without dyskinesia, without mention of fluctuations: Secondary | ICD-10-CM | POA: Diagnosis not present

## 2023-02-20 DIAGNOSIS — M1990 Unspecified osteoarthritis, unspecified site: Secondary | ICD-10-CM | POA: Diagnosis not present

## 2023-02-20 DIAGNOSIS — E1165 Type 2 diabetes mellitus with hyperglycemia: Secondary | ICD-10-CM | POA: Diagnosis not present

## 2023-02-20 NOTE — Progress Notes (Signed)
Orthopaedic Postop Note  Assessment: Beth Young is a 77 y.o. female s/p Right hip hemiarthroplasty  DOS: 12/29/2022  Plan: Mrs. Yantz doing very well.  She has no pain.  She is ambulating very well using a walker most of the time.  She states she takes some steps at home without assistance.  She is no longer working with therapy.  Urged her to continue ambulating with assistance.  Walking is excellent therapy.  She will continue to see improvements.  Follow-up in 6 weeks.   Follow-up: Return in about 6 weeks (around 04/03/2023). XR at next visit: AP pelvis and Right hip  Subjective:  Chief Complaint  Patient presents with   Routine Post Op    R hip DOS 12/29/22    History of Present Illness: Beth Young is a 77 y.o. female who presents following the above stated procedure.  Surgery was almost 2 months ago.  She is doing very well.  She remains at home.  She is by herself.  She does have family nearby.  She uses a walker for assistance, but does take some steps at home without assistance.  She denies numbness and tingling.  She is not taking anything consistently for pain.   Review of Systems: No fevers or chills No numbness or tingling No Chest Pain No shortness of breath   Objective: There were no vitals taken for this visit.  Physical Exam:  Alert and oriented.  No acute distress.  Ambulates with the assistance of a walker  Lateral base surgical incision is healing well.  No surrounding erythema or drainage.  No tenderness.  She can maintain straight leg raise.  She has no pain with gentle motion of the right hip.  No pain with axial loading.  IMAGING: I personally ordered and reviewed the following images:  Pelvis and right hip x-rays were obtained in clinic today.  These are compared to prior x-rays.  Right hip hemiarthroplasty in stable position.  No subsidence.  No fracture.  No dislocation.  No periprosthetic lucency.  Impression: Right hip  hemiarthroplasty in stable position, without subsidence  Oliver Barre, MD 02/20/2023 11:11 AM

## 2023-02-24 DIAGNOSIS — M79671 Pain in right foot: Secondary | ICD-10-CM | POA: Diagnosis not present

## 2023-02-24 DIAGNOSIS — I739 Peripheral vascular disease, unspecified: Secondary | ICD-10-CM | POA: Diagnosis not present

## 2023-02-24 DIAGNOSIS — M79675 Pain in left toe(s): Secondary | ICD-10-CM | POA: Diagnosis not present

## 2023-02-24 DIAGNOSIS — E1140 Type 2 diabetes mellitus with diabetic neuropathy, unspecified: Secondary | ICD-10-CM | POA: Diagnosis not present

## 2023-02-24 DIAGNOSIS — M79672 Pain in left foot: Secondary | ICD-10-CM | POA: Diagnosis not present

## 2023-02-24 DIAGNOSIS — M79674 Pain in right toe(s): Secondary | ICD-10-CM | POA: Diagnosis not present

## 2023-03-30 DIAGNOSIS — R300 Dysuria: Secondary | ICD-10-CM | POA: Diagnosis not present

## 2023-03-30 DIAGNOSIS — L209 Atopic dermatitis, unspecified: Secondary | ICD-10-CM | POA: Diagnosis not present

## 2023-03-30 DIAGNOSIS — Z6822 Body mass index (BMI) 22.0-22.9, adult: Secondary | ICD-10-CM | POA: Diagnosis not present

## 2023-04-01 DIAGNOSIS — R300 Dysuria: Secondary | ICD-10-CM | POA: Diagnosis not present

## 2023-04-03 ENCOUNTER — Encounter: Payer: 59 | Admitting: Orthopedic Surgery

## 2023-04-06 DIAGNOSIS — E1122 Type 2 diabetes mellitus with diabetic chronic kidney disease: Secondary | ICD-10-CM | POA: Diagnosis not present

## 2023-04-06 DIAGNOSIS — Z6823 Body mass index (BMI) 23.0-23.9, adult: Secondary | ICD-10-CM | POA: Diagnosis not present

## 2023-04-06 DIAGNOSIS — I951 Orthostatic hypotension: Secondary | ICD-10-CM | POA: Diagnosis not present

## 2023-04-06 DIAGNOSIS — N1832 Chronic kidney disease, stage 3b: Secondary | ICD-10-CM | POA: Diagnosis not present

## 2023-04-06 DIAGNOSIS — E782 Mixed hyperlipidemia: Secondary | ICD-10-CM | POA: Diagnosis not present

## 2023-04-17 ENCOUNTER — Ambulatory Visit: Payer: 59 | Admitting: Orthopedic Surgery

## 2023-04-17 ENCOUNTER — Other Ambulatory Visit (INDEPENDENT_AMBULATORY_CARE_PROVIDER_SITE_OTHER): Payer: 59

## 2023-04-17 ENCOUNTER — Encounter: Payer: Self-pay | Admitting: Orthopedic Surgery

## 2023-04-17 DIAGNOSIS — Z96641 Presence of right artificial hip joint: Secondary | ICD-10-CM

## 2023-04-17 DIAGNOSIS — S72001D Fracture of unspecified part of neck of right femur, subsequent encounter for closed fracture with routine healing: Secondary | ICD-10-CM

## 2023-04-17 DIAGNOSIS — Z96649 Presence of unspecified artificial hip joint: Secondary | ICD-10-CM

## 2023-04-17 NOTE — Progress Notes (Signed)
 Orthopaedic Postop Note  Assessment: Beth Young is a 78 y.o. female s/p Right hip hemiarthroplasty  DOS: 12/29/2022  Plan: Beth Young has done well following surgery.  She is not taking any medicines.  She does complain of some pain in her groin, only when she is walking.  This does not bother her enough to take medicine.  She continues to use a walker.  No issues with her incisions.  Advised her to continue to ambulate as part of her therapy.  Anticipate that the groin pain will gradually resolved.  She states understanding.  Follow-up in 3 months.   Follow-up: Return in about 3 months (around 07/15/2023). XR at next visit: AP pelvis and Right hip  Subjective:  Chief Complaint  Patient presents with   Post-op Follow-up    Improving, walking with walker, states still has some right hip / groin pain /felt some pain with external rotation for xray     History of Present Illness: Beth Young is a 78 y.o. female who presents following the above stated procedure.  Surgery was a little over 3 months ago.  She has recovered well to this point.  She continues to use a walker to assist with ambulation.  She will take some steps without assistance in her home.  She is not taking medicines.  She has occasional pain in her groin when ambulating.   Review of Systems: No fevers or chills No numbness or tingling No Chest Pain No shortness of breath   Objective: There were no vitals taken for this visit.  Physical Exam:  Alert and oriented.  No acute distress.  Ambulates with the assistance of a walker  Lateral hip surgical incision is healed.  No surrounding erythema or drainage.  She tolerates gentle range of motion of the hip.  No pain.  She is able to maintain a straight leg raise.  No tenderness palpation over the lateral hip.  Toes warm and well-perfused.  IMAGING: I personally ordered and reviewed the following images:  AP pelvis and right hip x-rays were obtained in clinic  today.  These are compared to available x-rays.  There has been no change in alignment of the right hip hemiarthroplasty.  No lucency around the prosthesis.  There is no subsidence.  No fractures.  No bony lesions.  Impression: Stable right hip hemiarthroplasty  Oneil DELENA Horde, MD 04/17/2023 10:56 AM

## 2023-05-26 DIAGNOSIS — M79672 Pain in left foot: Secondary | ICD-10-CM | POA: Diagnosis not present

## 2023-05-26 DIAGNOSIS — E1140 Type 2 diabetes mellitus with diabetic neuropathy, unspecified: Secondary | ICD-10-CM | POA: Diagnosis not present

## 2023-05-26 DIAGNOSIS — I739 Peripheral vascular disease, unspecified: Secondary | ICD-10-CM | POA: Diagnosis not present

## 2023-05-26 DIAGNOSIS — M79675 Pain in left toe(s): Secondary | ICD-10-CM | POA: Diagnosis not present

## 2023-05-26 DIAGNOSIS — M79674 Pain in right toe(s): Secondary | ICD-10-CM | POA: Diagnosis not present

## 2023-05-26 DIAGNOSIS — M79671 Pain in right foot: Secondary | ICD-10-CM | POA: Diagnosis not present

## 2023-07-17 ENCOUNTER — Ambulatory Visit: Payer: 59 | Admitting: Orthopedic Surgery

## 2023-07-21 ENCOUNTER — Ambulatory Visit: Admitting: Orthopedic Surgery

## 2023-07-28 DIAGNOSIS — E1122 Type 2 diabetes mellitus with diabetic chronic kidney disease: Secondary | ICD-10-CM | POA: Diagnosis not present

## 2023-07-28 DIAGNOSIS — R634 Abnormal weight loss: Secondary | ICD-10-CM | POA: Diagnosis not present

## 2023-07-28 DIAGNOSIS — E039 Hypothyroidism, unspecified: Secondary | ICD-10-CM | POA: Diagnosis not present

## 2023-07-28 DIAGNOSIS — E782 Mixed hyperlipidemia: Secondary | ICD-10-CM | POA: Diagnosis not present

## 2023-07-28 DIAGNOSIS — N1832 Chronic kidney disease, stage 3b: Secondary | ICD-10-CM | POA: Diagnosis not present

## 2023-07-28 DIAGNOSIS — K219 Gastro-esophageal reflux disease without esophagitis: Secondary | ICD-10-CM | POA: Diagnosis not present

## 2023-07-28 LAB — LAB REPORT - SCANNED
A1c: 6.9
EGFR: 47.8

## 2023-07-29 ENCOUNTER — Other Ambulatory Visit (INDEPENDENT_AMBULATORY_CARE_PROVIDER_SITE_OTHER)

## 2023-07-29 ENCOUNTER — Ambulatory Visit: Admitting: Orthopedic Surgery

## 2023-07-29 ENCOUNTER — Encounter: Payer: Self-pay | Admitting: Orthopedic Surgery

## 2023-07-29 DIAGNOSIS — S72001D Fracture of unspecified part of neck of right femur, subsequent encounter for closed fracture with routine healing: Secondary | ICD-10-CM

## 2023-07-29 NOTE — Progress Notes (Signed)
 Orthopaedic Postop Note  Assessment: Beth Young is a 78 y.o. female s/p Right hip hemiarthroplasty  DOS: 12/29/2022  Plan: Beth Young is currently having a lot of weakness in bilateral lower extremities.  In addition, she notes pain in the lower back.  She has gotten weak recently.  I have recommended physical therapy.  In addition, she does carry a diagnosis of Parkinson's.  Her tremor appears to have gotten worse recently.  Some of the weakness may be associated.  I have recommended she discuss her dosing and medications with Dr. Sharion Davidson.   Follow-up: Return if symptoms worsen or fail to improve. XR at next visit: AP pelvis and Right hip  Subjective:  Chief Complaint  Patient presents with   Post-op Follow-up    Right hip     History of Present Illness: Beth Young is a 78 y.o. female who presents following the above stated procedure.  Surgery was approximately 7 months ago.  He had recovered well.  She states over the past month, she has noticed her gait is unsteady.  She has worsening tremor.  She has to rely on using her walker at all times.  She denies numbness and tingling.  She has not been working with therapy.  She tries to walk daily, but needs assistance when she is walking.   Review of Systems: No fevers or chills No numbness or tingling No Chest Pain No shortness of breath   Objective: There were no vitals taken for this visit.  Physical Exam:  Alert and oriented.  No acute distress.  Ambulates with the assistance of a walker  Hip incision is healed.  No surrounding erythema or drainage.  She tolerates gentle range of motion.  She has tremor with attempted straight leg.  She is having weakness in bilateral lower extremities.  She also has pain in her lower back when attempting straight leg raise bilaterally.  IMAGING: I personally ordered and reviewed the following images:  AP pelvis and right hip x-rays were obtained in clinic today.  These are  compared to prior x-rays.  Right hip hemiarthroplasty in stable alignment.  No lucency.  No displacement.  No fractures.  Hip is reduced.  Impression: Right hip hemiarthroplasty in stable alignment  Tonita Frater, MD 07/29/2023 1:58 PM

## 2023-08-04 DIAGNOSIS — N1832 Chronic kidney disease, stage 3b: Secondary | ICD-10-CM | POA: Diagnosis not present

## 2023-08-04 DIAGNOSIS — E782 Mixed hyperlipidemia: Secondary | ICD-10-CM | POA: Diagnosis not present

## 2023-08-04 DIAGNOSIS — E1122 Type 2 diabetes mellitus with diabetic chronic kidney disease: Secondary | ICD-10-CM | POA: Diagnosis not present

## 2023-08-04 DIAGNOSIS — I951 Orthostatic hypotension: Secondary | ICD-10-CM | POA: Diagnosis not present

## 2023-08-04 DIAGNOSIS — Z6823 Body mass index (BMI) 23.0-23.9, adult: Secondary | ICD-10-CM | POA: Diagnosis not present

## 2023-08-07 DIAGNOSIS — R29898 Other symptoms and signs involving the musculoskeletal system: Secondary | ICD-10-CM | POA: Diagnosis not present

## 2023-08-07 DIAGNOSIS — S72001D Fracture of unspecified part of neck of right femur, subsequent encounter for closed fracture with routine healing: Secondary | ICD-10-CM | POA: Diagnosis not present

## 2023-08-07 DIAGNOSIS — Z9889 Other specified postprocedural states: Secondary | ICD-10-CM | POA: Diagnosis not present

## 2023-08-07 DIAGNOSIS — M25651 Stiffness of right hip, not elsewhere classified: Secondary | ICD-10-CM | POA: Diagnosis not present

## 2023-08-07 DIAGNOSIS — M545 Low back pain, unspecified: Secondary | ICD-10-CM | POA: Diagnosis not present

## 2023-08-07 DIAGNOSIS — G20A1 Parkinson's disease without dyskinesia, without mention of fluctuations: Secondary | ICD-10-CM | POA: Diagnosis not present

## 2023-08-07 DIAGNOSIS — M6281 Muscle weakness (generalized): Secondary | ICD-10-CM | POA: Diagnosis not present

## 2023-08-07 DIAGNOSIS — M25551 Pain in right hip: Secondary | ICD-10-CM | POA: Diagnosis not present

## 2023-08-07 DIAGNOSIS — R270 Ataxia, unspecified: Secondary | ICD-10-CM | POA: Diagnosis not present

## 2023-08-07 DIAGNOSIS — Z9181 History of falling: Secondary | ICD-10-CM | POA: Diagnosis not present

## 2023-08-10 DIAGNOSIS — M810 Age-related osteoporosis without current pathological fracture: Secondary | ICD-10-CM | POA: Diagnosis not present

## 2023-08-10 DIAGNOSIS — E782 Mixed hyperlipidemia: Secondary | ICD-10-CM | POA: Diagnosis not present

## 2023-08-10 DIAGNOSIS — I10 Essential (primary) hypertension: Secondary | ICD-10-CM | POA: Diagnosis not present

## 2023-08-10 DIAGNOSIS — E1122 Type 2 diabetes mellitus with diabetic chronic kidney disease: Secondary | ICD-10-CM | POA: Diagnosis not present

## 2023-08-12 DIAGNOSIS — Z9889 Other specified postprocedural states: Secondary | ICD-10-CM | POA: Diagnosis not present

## 2023-08-12 DIAGNOSIS — M25551 Pain in right hip: Secondary | ICD-10-CM | POA: Diagnosis not present

## 2023-08-12 DIAGNOSIS — M6281 Muscle weakness (generalized): Secondary | ICD-10-CM | POA: Diagnosis not present

## 2023-08-12 DIAGNOSIS — S72001D Fracture of unspecified part of neck of right femur, subsequent encounter for closed fracture with routine healing: Secondary | ICD-10-CM | POA: Diagnosis not present

## 2023-08-12 DIAGNOSIS — G20A1 Parkinson's disease without dyskinesia, without mention of fluctuations: Secondary | ICD-10-CM | POA: Diagnosis not present

## 2023-08-12 DIAGNOSIS — M545 Low back pain, unspecified: Secondary | ICD-10-CM | POA: Diagnosis not present

## 2023-08-12 DIAGNOSIS — R29898 Other symptoms and signs involving the musculoskeletal system: Secondary | ICD-10-CM | POA: Diagnosis not present

## 2023-08-12 DIAGNOSIS — M25651 Stiffness of right hip, not elsewhere classified: Secondary | ICD-10-CM | POA: Diagnosis not present

## 2023-08-12 DIAGNOSIS — Z9181 History of falling: Secondary | ICD-10-CM | POA: Diagnosis not present

## 2023-08-12 DIAGNOSIS — R270 Ataxia, unspecified: Secondary | ICD-10-CM | POA: Diagnosis not present

## 2023-08-14 DIAGNOSIS — S72001D Fracture of unspecified part of neck of right femur, subsequent encounter for closed fracture with routine healing: Secondary | ICD-10-CM | POA: Diagnosis not present

## 2023-08-14 DIAGNOSIS — M6281 Muscle weakness (generalized): Secondary | ICD-10-CM | POA: Diagnosis not present

## 2023-08-14 DIAGNOSIS — G20A1 Parkinson's disease without dyskinesia, without mention of fluctuations: Secondary | ICD-10-CM | POA: Diagnosis not present

## 2023-08-14 DIAGNOSIS — M25551 Pain in right hip: Secondary | ICD-10-CM | POA: Diagnosis not present

## 2023-08-14 DIAGNOSIS — R29898 Other symptoms and signs involving the musculoskeletal system: Secondary | ICD-10-CM | POA: Diagnosis not present

## 2023-08-14 DIAGNOSIS — M545 Low back pain, unspecified: Secondary | ICD-10-CM | POA: Diagnosis not present

## 2023-08-14 DIAGNOSIS — M25651 Stiffness of right hip, not elsewhere classified: Secondary | ICD-10-CM | POA: Diagnosis not present

## 2023-08-14 DIAGNOSIS — Z9889 Other specified postprocedural states: Secondary | ICD-10-CM | POA: Diagnosis not present

## 2023-08-14 DIAGNOSIS — Z9181 History of falling: Secondary | ICD-10-CM | POA: Diagnosis not present

## 2023-08-14 DIAGNOSIS — R270 Ataxia, unspecified: Secondary | ICD-10-CM | POA: Diagnosis not present

## 2023-08-21 DIAGNOSIS — M25551 Pain in right hip: Secondary | ICD-10-CM | POA: Diagnosis not present

## 2023-08-21 DIAGNOSIS — M6281 Muscle weakness (generalized): Secondary | ICD-10-CM | POA: Diagnosis not present

## 2023-08-21 DIAGNOSIS — R29898 Other symptoms and signs involving the musculoskeletal system: Secondary | ICD-10-CM | POA: Diagnosis not present

## 2023-08-21 DIAGNOSIS — R270 Ataxia, unspecified: Secondary | ICD-10-CM | POA: Diagnosis not present

## 2023-08-21 DIAGNOSIS — G20A1 Parkinson's disease without dyskinesia, without mention of fluctuations: Secondary | ICD-10-CM | POA: Diagnosis not present

## 2023-08-21 DIAGNOSIS — M25651 Stiffness of right hip, not elsewhere classified: Secondary | ICD-10-CM | POA: Diagnosis not present

## 2023-08-21 DIAGNOSIS — Z9889 Other specified postprocedural states: Secondary | ICD-10-CM | POA: Diagnosis not present

## 2023-08-21 DIAGNOSIS — M545 Low back pain, unspecified: Secondary | ICD-10-CM | POA: Diagnosis not present

## 2023-08-21 DIAGNOSIS — S72001D Fracture of unspecified part of neck of right femur, subsequent encounter for closed fracture with routine healing: Secondary | ICD-10-CM | POA: Diagnosis not present

## 2023-08-21 DIAGNOSIS — Z9181 History of falling: Secondary | ICD-10-CM | POA: Diagnosis not present

## 2023-08-24 DIAGNOSIS — R270 Ataxia, unspecified: Secondary | ICD-10-CM | POA: Diagnosis not present

## 2023-08-24 DIAGNOSIS — G20A1 Parkinson's disease without dyskinesia, without mention of fluctuations: Secondary | ICD-10-CM | POA: Diagnosis not present

## 2023-08-24 DIAGNOSIS — M25651 Stiffness of right hip, not elsewhere classified: Secondary | ICD-10-CM | POA: Diagnosis not present

## 2023-08-24 DIAGNOSIS — S72001D Fracture of unspecified part of neck of right femur, subsequent encounter for closed fracture with routine healing: Secondary | ICD-10-CM | POA: Diagnosis not present

## 2023-08-24 DIAGNOSIS — M25551 Pain in right hip: Secondary | ICD-10-CM | POA: Diagnosis not present

## 2023-08-24 DIAGNOSIS — M545 Low back pain, unspecified: Secondary | ICD-10-CM | POA: Diagnosis not present

## 2023-08-24 DIAGNOSIS — Z9889 Other specified postprocedural states: Secondary | ICD-10-CM | POA: Diagnosis not present

## 2023-08-24 DIAGNOSIS — R29898 Other symptoms and signs involving the musculoskeletal system: Secondary | ICD-10-CM | POA: Diagnosis not present

## 2023-08-24 DIAGNOSIS — M6281 Muscle weakness (generalized): Secondary | ICD-10-CM | POA: Diagnosis not present

## 2023-08-24 DIAGNOSIS — Z9181 History of falling: Secondary | ICD-10-CM | POA: Diagnosis not present

## 2023-08-25 DIAGNOSIS — M79671 Pain in right foot: Secondary | ICD-10-CM | POA: Diagnosis not present

## 2023-08-25 DIAGNOSIS — E1140 Type 2 diabetes mellitus with diabetic neuropathy, unspecified: Secondary | ICD-10-CM | POA: Diagnosis not present

## 2023-08-25 DIAGNOSIS — I739 Peripheral vascular disease, unspecified: Secondary | ICD-10-CM | POA: Diagnosis not present

## 2023-08-25 DIAGNOSIS — M79675 Pain in left toe(s): Secondary | ICD-10-CM | POA: Diagnosis not present

## 2023-08-25 DIAGNOSIS — M79672 Pain in left foot: Secondary | ICD-10-CM | POA: Diagnosis not present

## 2023-08-25 DIAGNOSIS — M79674 Pain in right toe(s): Secondary | ICD-10-CM | POA: Diagnosis not present

## 2023-08-26 DIAGNOSIS — Z9889 Other specified postprocedural states: Secondary | ICD-10-CM | POA: Diagnosis not present

## 2023-08-26 DIAGNOSIS — M545 Low back pain, unspecified: Secondary | ICD-10-CM | POA: Diagnosis not present

## 2023-08-26 DIAGNOSIS — S72001D Fracture of unspecified part of neck of right femur, subsequent encounter for closed fracture with routine healing: Secondary | ICD-10-CM | POA: Diagnosis not present

## 2023-08-26 DIAGNOSIS — G20A1 Parkinson's disease without dyskinesia, without mention of fluctuations: Secondary | ICD-10-CM | POA: Diagnosis not present

## 2023-08-26 DIAGNOSIS — R270 Ataxia, unspecified: Secondary | ICD-10-CM | POA: Diagnosis not present

## 2023-08-26 DIAGNOSIS — Z9181 History of falling: Secondary | ICD-10-CM | POA: Diagnosis not present

## 2023-08-26 DIAGNOSIS — M25651 Stiffness of right hip, not elsewhere classified: Secondary | ICD-10-CM | POA: Diagnosis not present

## 2023-08-26 DIAGNOSIS — R29898 Other symptoms and signs involving the musculoskeletal system: Secondary | ICD-10-CM | POA: Diagnosis not present

## 2023-08-26 DIAGNOSIS — M6281 Muscle weakness (generalized): Secondary | ICD-10-CM | POA: Diagnosis not present

## 2023-08-26 DIAGNOSIS — M25551 Pain in right hip: Secondary | ICD-10-CM | POA: Diagnosis not present

## 2023-09-01 DIAGNOSIS — R29898 Other symptoms and signs involving the musculoskeletal system: Secondary | ICD-10-CM | POA: Diagnosis not present

## 2023-09-01 DIAGNOSIS — G20A1 Parkinson's disease without dyskinesia, without mention of fluctuations: Secondary | ICD-10-CM | POA: Diagnosis not present

## 2023-09-01 DIAGNOSIS — R270 Ataxia, unspecified: Secondary | ICD-10-CM | POA: Diagnosis not present

## 2023-09-01 DIAGNOSIS — Z9181 History of falling: Secondary | ICD-10-CM | POA: Diagnosis not present

## 2023-09-01 DIAGNOSIS — M545 Low back pain, unspecified: Secondary | ICD-10-CM | POA: Diagnosis not present

## 2023-09-01 DIAGNOSIS — M25551 Pain in right hip: Secondary | ICD-10-CM | POA: Diagnosis not present

## 2023-09-01 DIAGNOSIS — M6281 Muscle weakness (generalized): Secondary | ICD-10-CM | POA: Diagnosis not present

## 2023-09-01 DIAGNOSIS — M25651 Stiffness of right hip, not elsewhere classified: Secondary | ICD-10-CM | POA: Diagnosis not present

## 2023-09-01 DIAGNOSIS — S72001D Fracture of unspecified part of neck of right femur, subsequent encounter for closed fracture with routine healing: Secondary | ICD-10-CM | POA: Diagnosis not present

## 2023-09-01 DIAGNOSIS — Z9889 Other specified postprocedural states: Secondary | ICD-10-CM | POA: Diagnosis not present

## 2023-09-03 DIAGNOSIS — Z9181 History of falling: Secondary | ICD-10-CM | POA: Diagnosis not present

## 2023-09-03 DIAGNOSIS — M25551 Pain in right hip: Secondary | ICD-10-CM | POA: Diagnosis not present

## 2023-09-03 DIAGNOSIS — R270 Ataxia, unspecified: Secondary | ICD-10-CM | POA: Diagnosis not present

## 2023-09-03 DIAGNOSIS — M545 Low back pain, unspecified: Secondary | ICD-10-CM | POA: Diagnosis not present

## 2023-09-03 DIAGNOSIS — R29898 Other symptoms and signs involving the musculoskeletal system: Secondary | ICD-10-CM | POA: Diagnosis not present

## 2023-09-03 DIAGNOSIS — S72001D Fracture of unspecified part of neck of right femur, subsequent encounter for closed fracture with routine healing: Secondary | ICD-10-CM | POA: Diagnosis not present

## 2023-09-03 DIAGNOSIS — M6281 Muscle weakness (generalized): Secondary | ICD-10-CM | POA: Diagnosis not present

## 2023-09-03 DIAGNOSIS — G20A1 Parkinson's disease without dyskinesia, without mention of fluctuations: Secondary | ICD-10-CM | POA: Diagnosis not present

## 2023-09-03 DIAGNOSIS — M25651 Stiffness of right hip, not elsewhere classified: Secondary | ICD-10-CM | POA: Diagnosis not present

## 2023-09-03 DIAGNOSIS — Z9889 Other specified postprocedural states: Secondary | ICD-10-CM | POA: Diagnosis not present

## 2023-09-08 DIAGNOSIS — E782 Mixed hyperlipidemia: Secondary | ICD-10-CM | POA: Diagnosis not present

## 2023-09-08 DIAGNOSIS — M545 Low back pain, unspecified: Secondary | ICD-10-CM | POA: Diagnosis not present

## 2023-09-08 DIAGNOSIS — S72001D Fracture of unspecified part of neck of right femur, subsequent encounter for closed fracture with routine healing: Secondary | ICD-10-CM | POA: Diagnosis not present

## 2023-09-08 DIAGNOSIS — Z9889 Other specified postprocedural states: Secondary | ICD-10-CM | POA: Diagnosis not present

## 2023-09-08 DIAGNOSIS — M25651 Stiffness of right hip, not elsewhere classified: Secondary | ICD-10-CM | POA: Diagnosis not present

## 2023-09-08 DIAGNOSIS — M6281 Muscle weakness (generalized): Secondary | ICD-10-CM | POA: Diagnosis not present

## 2023-09-08 DIAGNOSIS — R29898 Other symptoms and signs involving the musculoskeletal system: Secondary | ICD-10-CM | POA: Diagnosis not present

## 2023-09-08 DIAGNOSIS — E1122 Type 2 diabetes mellitus with diabetic chronic kidney disease: Secondary | ICD-10-CM | POA: Diagnosis not present

## 2023-09-08 DIAGNOSIS — Z9181 History of falling: Secondary | ICD-10-CM | POA: Diagnosis not present

## 2023-09-08 DIAGNOSIS — G20A1 Parkinson's disease without dyskinesia, without mention of fluctuations: Secondary | ICD-10-CM | POA: Diagnosis not present

## 2023-09-08 DIAGNOSIS — R270 Ataxia, unspecified: Secondary | ICD-10-CM | POA: Diagnosis not present

## 2023-09-08 DIAGNOSIS — I10 Essential (primary) hypertension: Secondary | ICD-10-CM | POA: Diagnosis not present

## 2023-09-08 DIAGNOSIS — M25551 Pain in right hip: Secondary | ICD-10-CM | POA: Diagnosis not present

## 2023-09-10 DIAGNOSIS — R270 Ataxia, unspecified: Secondary | ICD-10-CM | POA: Diagnosis not present

## 2023-09-10 DIAGNOSIS — R29898 Other symptoms and signs involving the musculoskeletal system: Secondary | ICD-10-CM | POA: Diagnosis not present

## 2023-09-10 DIAGNOSIS — S72001D Fracture of unspecified part of neck of right femur, subsequent encounter for closed fracture with routine healing: Secondary | ICD-10-CM | POA: Diagnosis not present

## 2023-09-10 DIAGNOSIS — Z9889 Other specified postprocedural states: Secondary | ICD-10-CM | POA: Diagnosis not present

## 2023-09-10 DIAGNOSIS — M545 Low back pain, unspecified: Secondary | ICD-10-CM | POA: Diagnosis not present

## 2023-09-10 DIAGNOSIS — M25551 Pain in right hip: Secondary | ICD-10-CM | POA: Diagnosis not present

## 2023-09-10 DIAGNOSIS — M6281 Muscle weakness (generalized): Secondary | ICD-10-CM | POA: Diagnosis not present

## 2023-09-10 DIAGNOSIS — G20A1 Parkinson's disease without dyskinesia, without mention of fluctuations: Secondary | ICD-10-CM | POA: Diagnosis not present

## 2023-09-10 DIAGNOSIS — M25651 Stiffness of right hip, not elsewhere classified: Secondary | ICD-10-CM | POA: Diagnosis not present

## 2023-09-10 DIAGNOSIS — Z9181 History of falling: Secondary | ICD-10-CM | POA: Diagnosis not present

## 2023-09-14 DIAGNOSIS — D649 Anemia, unspecified: Secondary | ICD-10-CM | POA: Diagnosis not present

## 2023-09-14 DIAGNOSIS — Z8673 Personal history of transient ischemic attack (TIA), and cerebral infarction without residual deficits: Secondary | ICD-10-CM | POA: Diagnosis not present

## 2023-09-14 DIAGNOSIS — G20A1 Parkinson's disease without dyskinesia, without mention of fluctuations: Secondary | ICD-10-CM | POA: Diagnosis not present

## 2023-09-14 DIAGNOSIS — K2100 Gastro-esophageal reflux disease with esophagitis, without bleeding: Secondary | ICD-10-CM | POA: Diagnosis not present

## 2023-09-14 DIAGNOSIS — S72001D Fracture of unspecified part of neck of right femur, subsequent encounter for closed fracture with routine healing: Secondary | ICD-10-CM | POA: Diagnosis not present

## 2023-09-14 DIAGNOSIS — J301 Allergic rhinitis due to pollen: Secondary | ICD-10-CM | POA: Diagnosis not present

## 2023-09-14 DIAGNOSIS — M810 Age-related osteoporosis without current pathological fracture: Secondary | ICD-10-CM | POA: Diagnosis not present

## 2023-09-14 DIAGNOSIS — I10 Essential (primary) hypertension: Secondary | ICD-10-CM | POA: Diagnosis not present

## 2023-09-14 DIAGNOSIS — K580 Irritable bowel syndrome with diarrhea: Secondary | ICD-10-CM | POA: Diagnosis not present

## 2023-09-14 DIAGNOSIS — E782 Mixed hyperlipidemia: Secondary | ICD-10-CM | POA: Diagnosis not present

## 2023-09-14 DIAGNOSIS — E1122 Type 2 diabetes mellitus with diabetic chronic kidney disease: Secondary | ICD-10-CM | POA: Diagnosis not present

## 2023-09-17 DIAGNOSIS — M25551 Pain in right hip: Secondary | ICD-10-CM | POA: Diagnosis not present

## 2023-09-17 DIAGNOSIS — M6281 Muscle weakness (generalized): Secondary | ICD-10-CM | POA: Diagnosis not present

## 2023-09-17 DIAGNOSIS — R29898 Other symptoms and signs involving the musculoskeletal system: Secondary | ICD-10-CM | POA: Diagnosis not present

## 2023-09-17 DIAGNOSIS — G20A1 Parkinson's disease without dyskinesia, without mention of fluctuations: Secondary | ICD-10-CM | POA: Diagnosis not present

## 2023-09-17 DIAGNOSIS — Z9889 Other specified postprocedural states: Secondary | ICD-10-CM | POA: Diagnosis not present

## 2023-09-17 DIAGNOSIS — S72001D Fracture of unspecified part of neck of right femur, subsequent encounter for closed fracture with routine healing: Secondary | ICD-10-CM | POA: Diagnosis not present

## 2023-09-17 DIAGNOSIS — M545 Low back pain, unspecified: Secondary | ICD-10-CM | POA: Diagnosis not present

## 2023-09-17 DIAGNOSIS — R270 Ataxia, unspecified: Secondary | ICD-10-CM | POA: Diagnosis not present

## 2023-09-17 DIAGNOSIS — M25651 Stiffness of right hip, not elsewhere classified: Secondary | ICD-10-CM | POA: Diagnosis not present

## 2023-09-17 DIAGNOSIS — Z9181 History of falling: Secondary | ICD-10-CM | POA: Diagnosis not present

## 2023-09-23 DIAGNOSIS — S72001D Fracture of unspecified part of neck of right femur, subsequent encounter for closed fracture with routine healing: Secondary | ICD-10-CM | POA: Diagnosis not present

## 2023-09-23 DIAGNOSIS — Z9181 History of falling: Secondary | ICD-10-CM | POA: Diagnosis not present

## 2023-09-23 DIAGNOSIS — R270 Ataxia, unspecified: Secondary | ICD-10-CM | POA: Diagnosis not present

## 2023-09-23 DIAGNOSIS — M25551 Pain in right hip: Secondary | ICD-10-CM | POA: Diagnosis not present

## 2023-09-23 DIAGNOSIS — M25651 Stiffness of right hip, not elsewhere classified: Secondary | ICD-10-CM | POA: Diagnosis not present

## 2023-09-23 DIAGNOSIS — M545 Low back pain, unspecified: Secondary | ICD-10-CM | POA: Diagnosis not present

## 2023-09-23 DIAGNOSIS — M6281 Muscle weakness (generalized): Secondary | ICD-10-CM | POA: Diagnosis not present

## 2023-09-23 DIAGNOSIS — R29898 Other symptoms and signs involving the musculoskeletal system: Secondary | ICD-10-CM | POA: Diagnosis not present

## 2023-09-23 DIAGNOSIS — G20A1 Parkinson's disease without dyskinesia, without mention of fluctuations: Secondary | ICD-10-CM | POA: Diagnosis not present

## 2023-09-23 DIAGNOSIS — Z9889 Other specified postprocedural states: Secondary | ICD-10-CM | POA: Diagnosis not present

## 2023-09-29 DIAGNOSIS — G20A1 Parkinson's disease without dyskinesia, without mention of fluctuations: Secondary | ICD-10-CM | POA: Diagnosis not present

## 2023-09-29 DIAGNOSIS — D649 Anemia, unspecified: Secondary | ICD-10-CM | POA: Diagnosis not present

## 2023-09-29 DIAGNOSIS — K2100 Gastro-esophageal reflux disease with esophagitis, without bleeding: Secondary | ICD-10-CM | POA: Diagnosis not present

## 2023-09-29 DIAGNOSIS — E782 Mixed hyperlipidemia: Secondary | ICD-10-CM | POA: Diagnosis not present

## 2023-09-29 DIAGNOSIS — R270 Ataxia, unspecified: Secondary | ICD-10-CM | POA: Diagnosis not present

## 2023-09-29 DIAGNOSIS — M810 Age-related osteoporosis without current pathological fracture: Secondary | ICD-10-CM | POA: Diagnosis not present

## 2023-09-29 DIAGNOSIS — M545 Low back pain, unspecified: Secondary | ICD-10-CM | POA: Diagnosis not present

## 2023-09-29 DIAGNOSIS — Z8673 Personal history of transient ischemic attack (TIA), and cerebral infarction without residual deficits: Secondary | ICD-10-CM | POA: Diagnosis not present

## 2023-09-29 DIAGNOSIS — E1122 Type 2 diabetes mellitus with diabetic chronic kidney disease: Secondary | ICD-10-CM | POA: Diagnosis not present

## 2023-09-29 DIAGNOSIS — Z9889 Other specified postprocedural states: Secondary | ICD-10-CM | POA: Diagnosis not present

## 2023-09-29 DIAGNOSIS — K580 Irritable bowel syndrome with diarrhea: Secondary | ICD-10-CM | POA: Diagnosis not present

## 2023-09-29 DIAGNOSIS — I10 Essential (primary) hypertension: Secondary | ICD-10-CM | POA: Diagnosis not present

## 2023-09-29 DIAGNOSIS — S72001D Fracture of unspecified part of neck of right femur, subsequent encounter for closed fracture with routine healing: Secondary | ICD-10-CM | POA: Diagnosis not present

## 2023-09-29 DIAGNOSIS — M25651 Stiffness of right hip, not elsewhere classified: Secondary | ICD-10-CM | POA: Diagnosis not present

## 2023-09-29 DIAGNOSIS — J301 Allergic rhinitis due to pollen: Secondary | ICD-10-CM | POA: Diagnosis not present

## 2023-09-29 DIAGNOSIS — R29898 Other symptoms and signs involving the musculoskeletal system: Secondary | ICD-10-CM | POA: Diagnosis not present

## 2023-09-29 DIAGNOSIS — M25551 Pain in right hip: Secondary | ICD-10-CM | POA: Diagnosis not present

## 2023-09-29 DIAGNOSIS — M6281 Muscle weakness (generalized): Secondary | ICD-10-CM | POA: Diagnosis not present

## 2023-09-29 DIAGNOSIS — Z9181 History of falling: Secondary | ICD-10-CM | POA: Diagnosis not present

## 2023-10-05 DIAGNOSIS — M6281 Muscle weakness (generalized): Secondary | ICD-10-CM | POA: Diagnosis not present

## 2023-10-05 DIAGNOSIS — G20A1 Parkinson's disease without dyskinesia, without mention of fluctuations: Secondary | ICD-10-CM | POA: Diagnosis not present

## 2023-10-05 DIAGNOSIS — S72001D Fracture of unspecified part of neck of right femur, subsequent encounter for closed fracture with routine healing: Secondary | ICD-10-CM | POA: Diagnosis not present

## 2023-10-05 DIAGNOSIS — M545 Low back pain, unspecified: Secondary | ICD-10-CM | POA: Diagnosis not present

## 2023-10-05 DIAGNOSIS — R270 Ataxia, unspecified: Secondary | ICD-10-CM | POA: Diagnosis not present

## 2023-10-05 DIAGNOSIS — Z9889 Other specified postprocedural states: Secondary | ICD-10-CM | POA: Diagnosis not present

## 2023-10-05 DIAGNOSIS — M25651 Stiffness of right hip, not elsewhere classified: Secondary | ICD-10-CM | POA: Diagnosis not present

## 2023-10-05 DIAGNOSIS — R29898 Other symptoms and signs involving the musculoskeletal system: Secondary | ICD-10-CM | POA: Diagnosis not present

## 2023-10-05 DIAGNOSIS — Z9181 History of falling: Secondary | ICD-10-CM | POA: Diagnosis not present

## 2023-10-05 DIAGNOSIS — M25551 Pain in right hip: Secondary | ICD-10-CM | POA: Diagnosis not present

## 2023-10-07 DIAGNOSIS — M545 Low back pain, unspecified: Secondary | ICD-10-CM | POA: Diagnosis not present

## 2023-10-09 DIAGNOSIS — E782 Mixed hyperlipidemia: Secondary | ICD-10-CM | POA: Diagnosis not present

## 2023-10-09 DIAGNOSIS — I10 Essential (primary) hypertension: Secondary | ICD-10-CM | POA: Diagnosis not present

## 2023-10-09 DIAGNOSIS — E039 Hypothyroidism, unspecified: Secondary | ICD-10-CM | POA: Diagnosis not present

## 2023-10-09 DIAGNOSIS — E1122 Type 2 diabetes mellitus with diabetic chronic kidney disease: Secondary | ICD-10-CM | POA: Diagnosis not present

## 2023-10-26 ENCOUNTER — Telehealth: Payer: Self-pay

## 2023-10-26 DIAGNOSIS — S72001A Fracture of unspecified part of neck of right femur, initial encounter for closed fracture: Secondary | ICD-10-CM

## 2023-10-27 DIAGNOSIS — E782 Mixed hyperlipidemia: Secondary | ICD-10-CM | POA: Diagnosis not present

## 2023-10-27 DIAGNOSIS — S72001D Fracture of unspecified part of neck of right femur, subsequent encounter for closed fracture with routine healing: Secondary | ICD-10-CM | POA: Diagnosis not present

## 2023-10-27 DIAGNOSIS — Z8673 Personal history of transient ischemic attack (TIA), and cerebral infarction without residual deficits: Secondary | ICD-10-CM | POA: Diagnosis not present

## 2023-10-27 DIAGNOSIS — K580 Irritable bowel syndrome with diarrhea: Secondary | ICD-10-CM | POA: Diagnosis not present

## 2023-10-27 DIAGNOSIS — D649 Anemia, unspecified: Secondary | ICD-10-CM | POA: Diagnosis not present

## 2023-10-27 DIAGNOSIS — G20A1 Parkinson's disease without dyskinesia, without mention of fluctuations: Secondary | ICD-10-CM | POA: Diagnosis not present

## 2023-10-27 DIAGNOSIS — J301 Allergic rhinitis due to pollen: Secondary | ICD-10-CM | POA: Diagnosis not present

## 2023-10-27 DIAGNOSIS — E1122 Type 2 diabetes mellitus with diabetic chronic kidney disease: Secondary | ICD-10-CM | POA: Diagnosis not present

## 2023-10-27 DIAGNOSIS — I10 Essential (primary) hypertension: Secondary | ICD-10-CM | POA: Diagnosis not present

## 2023-10-27 DIAGNOSIS — K2100 Gastro-esophageal reflux disease with esophagitis, without bleeding: Secondary | ICD-10-CM | POA: Diagnosis not present

## 2023-10-27 DIAGNOSIS — M810 Age-related osteoporosis without current pathological fracture: Secondary | ICD-10-CM | POA: Diagnosis not present

## 2023-10-28 ENCOUNTER — Telehealth: Payer: Self-pay

## 2023-10-28 NOTE — Progress Notes (Unsigned)
 Complex Care Management Note Care Guide Note  10/28/2023 Name: Beth Young MRN: [id number] DOB: 05-Mar-1946   Complex Care Management Outreach Attempts: An unsuccessful telephone outreach was attempted today to offer the patient information about available complex care management services.  Follow Up Plan:  Additional outreach attempts will be made to offer the patient complex care management information and services.   Encounter Outcome:  No Answer  Leotis Rase [facility], [facility] Guide  Direct Dial: [phone]  Fax [phone]

## 2023-10-29 NOTE — Progress Notes (Unsigned)
 Complex Care Management Note Care Guide Note  10/29/2023 Name: Beth Young MRN: [id number] DOB: [birth year]   Complex Care Management Outreach Attempts: A second unsuccessful outreach was attempted today to offer the patient with information about available complex care management services.  Follow Up Plan:  Additional outreach attempts will be made to offer the patient complex care management information and services.   Encounter Outcome:  No Answer  Leotis Rase [facility], [facility] Guide  Direct Dial: [phone]  Fax [phone]

## 2023-10-30 NOTE — Progress Notes (Signed)
 Complex Care Management Note  Care Guide Note 10/30/2023 Name: SANAM MARMO MRN: [id number] DOB: 05/31/1945  ANJOLI DIEMER is a 78 y.o. year old female who sees [facility] for primary care. I reached out to [place] JULIANNA Feeling by phone today to offer complex care management services.  Ms. Kafer was given information about Complex Care Management services today including:   The Complex Care Management services include support from the care team which includes your Nurse Care Manager, Clinical Social Worker, or Pharmacist.  The Complex Care Management team is here to help remove barriers to the health concerns and goals most important to you. Complex Care Management services are voluntary, and the patient may decline or stop services at any time by request to their care team member.   Complex Care Management Consent Status: Patient agreed to services and verbal consent obtained.   Follow up plan:  Telephone appointment with complex care management team member scheduled for:  11/02/23 @ 1 PM  Encounter Outcome:  Patient Scheduled   Leotis Rase [facility], [facility] Guide  Direct Dial: [phone]  Fax [phone]

## 2023-11-02 ENCOUNTER — Other Ambulatory Visit: Payer: Self-pay

## 2023-11-02 NOTE — Patient Instructions (Signed)
 Visit Information  Thank you for taking time to visit with me today. Please don't hesitate to contact me if I can be of assistance to you before our next scheduled appointment.  Our next appointment is no further scheduled appointments.   Please call the care guide team at [phone] if you need to cancel or reschedule your appointment.      Please call the Suicide and Crisis Lifeline: 988 call the [place]  National Suicide Prevention Lifeline: 5614008894 or TTY: (979) 689-2988 TTY (404) 719-2111) to talk to a trained counselor call 1-800-273-TALK (toll free, 24 hour hotline) call the [facility]: (773)726-0754 call 911 if you are experiencing a Mental Health or Behavioral Health Crisis or need someone to talk to.  Patient verbalizes understanding of instructions and care plan provided today and agrees to view in MyChart. Active MyChart status and patient understanding of how to access instructions and care plan via MyChart confirmed with patient.     Orlean Fey, BSW [place]  [facility] Social Worker, [employer] [facility] [phone]

## 2023-11-02 NOTE — Patient Outreach (Signed)
 Complex Care Management   Visit Note  11/02/2023  Name:  Beth Young MRN: [id number] DOB: [birth year]  Situation: Referral received for Complex Care Management related to Information on assisted living I obtained verbal consent from Patient.  Visit completed with Patient  on the phone  Background:   Past Medical History:  Diagnosis Date   Anxiety    Arthritis    Asthma    Depression    Essential tremor    with implantable brain stimulator device   GERD (gastroesophageal reflux disease)    History of stroke    Hyperlipidemia    Hypertension    Insomnia    Osteoporosis    Parkinson's disease with use of electrical brain stimulation (HCC)    Restless legs syndrome    Type 2 diabetes mellitus (HCC)     Assessment:  BSW spoke with patient to assess for SDOH barriers. During the call, no barriers were identified at this time. Patient reported that she may need new dentures soon but does not recall when she last obtained her current pair. The referral indicated that patient wanted information about assisted living facilities; however, during the call, patient stated she is not interested and is satisfied with her current living arrangements. BSW informed patient that if her needs or interests change in the future, she can reach out for more information regarding assisted living options. BSW will send patient resources for dentures. Case will be closed at this time.  Resources Sent:  [facility] [phone]  [address] [place], [place] [address]  [facility] [phone]  [address] [place], [place] [address]  [address] [place], [place]) [phone]  [phone] [address] [place], [place] [address]     Recommendation:   No recommendations at this time  Follow Up Plan:   Patient has met all care management goals. Care Management case will be closed. Patient has been provided contact information should new needs arise.    Orlean Fey, BSW [place]  [facility] Social Worker, [facility] [phone]

## 2023-11-04 DIAGNOSIS — D559 Anemia due to enzyme disorder, unspecified: Secondary | ICD-10-CM | POA: Diagnosis not present

## 2023-11-04 DIAGNOSIS — E559 Vitamin D deficiency, unspecified: Secondary | ICD-10-CM | POA: Diagnosis not present

## 2023-11-04 DIAGNOSIS — E7849 Other hyperlipidemia: Secondary | ICD-10-CM | POA: Diagnosis not present

## 2023-11-04 DIAGNOSIS — Z0001 Encounter for general adult medical examination with abnormal findings: Secondary | ICD-10-CM | POA: Diagnosis not present

## 2023-11-04 DIAGNOSIS — E1122 Type 2 diabetes mellitus with diabetic chronic kidney disease: Secondary | ICD-10-CM | POA: Diagnosis not present

## 2023-11-04 DIAGNOSIS — E039 Hypothyroidism, unspecified: Secondary | ICD-10-CM | POA: Diagnosis not present

## 2023-11-09 DIAGNOSIS — I10 Essential (primary) hypertension: Secondary | ICD-10-CM | POA: Diagnosis not present

## 2023-11-09 DIAGNOSIS — E782 Mixed hyperlipidemia: Secondary | ICD-10-CM | POA: Diagnosis not present

## 2023-11-09 DIAGNOSIS — E1122 Type 2 diabetes mellitus with diabetic chronic kidney disease: Secondary | ICD-10-CM | POA: Diagnosis not present

## 2023-11-10 ENCOUNTER — Telehealth: Payer: Self-pay

## 2023-11-10 DIAGNOSIS — Z1389 Encounter for screening for other disorder: Secondary | ICD-10-CM | POA: Diagnosis not present

## 2023-11-10 DIAGNOSIS — N1832 Chronic kidney disease, stage 3b: Secondary | ICD-10-CM | POA: Diagnosis not present

## 2023-11-10 DIAGNOSIS — I951 Orthostatic hypotension: Secondary | ICD-10-CM | POA: Diagnosis not present

## 2023-11-10 DIAGNOSIS — G20A1 Parkinson's disease without dyskinesia, without mention of fluctuations: Secondary | ICD-10-CM

## 2023-11-10 DIAGNOSIS — E782 Mixed hyperlipidemia: Secondary | ICD-10-CM | POA: Diagnosis not present

## 2023-11-10 DIAGNOSIS — Z0001 Encounter for general adult medical examination with abnormal findings: Secondary | ICD-10-CM | POA: Diagnosis not present

## 2023-11-10 DIAGNOSIS — E1122 Type 2 diabetes mellitus with diabetic chronic kidney disease: Secondary | ICD-10-CM | POA: Diagnosis not present

## 2023-11-10 DIAGNOSIS — Z6824 Body mass index (BMI) 24.0-24.9, adult: Secondary | ICD-10-CM | POA: Diagnosis not present

## 2023-11-12 ENCOUNTER — Telehealth: Payer: Self-pay

## 2023-11-12 NOTE — Progress Notes (Signed)
 Complex Care Management Note Care Guide Note  11/12/2023 Name: Beth Young MRN: [id number] DOB: 05-Aug-1945   Complex Care Management Outreach Attempts: An unsuccessful telephone outreach was attempted today to offer the patient information about available complex care management services.  Follow Up Plan:  Additional outreach attempts will be made to offer the patient complex care management information and services.   Encounter Outcome:  No Answer- Phone hung up  Leotis Rase [facility], [facility] Guide  Direct Dial: [phone]  Fax [phone]

## 2023-11-13 NOTE — Progress Notes (Signed)
 Complex Care Management Note Care Guide Note  11/13/2023 Name: Beth Young MRN: [id number] DOB: 20-Nov-1945   Complex Care Management Outreach Attempts: A second unsuccessful outreach was attempted today to offer the patient with information about available complex care management services.  Follow Up Plan:  Additional outreach attempts will be made to offer the patient complex care management information and services.   Encounter Outcome:  No Answer- No voicemail- unable to leave a message  Leotis Rase [facility], [facility] Guide  Direct Dial: [phone]  Fax [phone]

## 2023-11-16 ENCOUNTER — Telehealth: Payer: Self-pay

## 2023-11-16 DIAGNOSIS — Z1231 Encounter for screening mammogram for malignant neoplasm of breast: Secondary | ICD-10-CM | POA: Diagnosis not present

## 2023-11-16 NOTE — Progress Notes (Signed)
 Complex Care Management Note  Care Guide Note 11/16/2023 Name: EILEE SCHADER MRN: [id number] DOB: [birth year]  YARELY BEBEE is a 78 y.o. year old female who sees [facility] for primary care. I reached out to [place] JULIANNA Feeling by phone today to offer complex care management services.  Ms. Lauman was given information about Complex Care Management services today including:   The Complex Care Management services include support from the care team which includes your Nurse Care Manager, Clinical Social Worker, or Pharmacist.  The Complex Care Management team is here to help remove barriers to the health concerns and goals most important to you. Complex Care Management services are voluntary, and the patient may decline or stop services at any time by request to their care team member.   Complex Care Management Consent Status: Patient agreed to services and verbal consent obtained.   Follow up plan:  Telephone appointment with complex care management team member scheduled for:  11/26/23 @ 9 AM  Encounter Outcome:  Patient Scheduled  Leotis Rase [facility], [facility] Guide  Direct Dial: [phone]  Fax [phone]

## 2023-11-16 NOTE — Progress Notes (Signed)
 Complex Care Management Note Care Guide Note  11/16/2023 Name: Beth Young MRN: [id number] DOB: 07-18-1945   Complex Care Management Outreach Attempts: A third unsuccessful outreach was attempted today to offer the patient with information about available complex care management services.  Follow Up Plan:  No further outreach attempts will be made at this time. We have been unable to contact the patient to offer or enroll patient in complex care management services.  Encounter Outcome:  No Answer-Left voicemail  Leotis Rase [facility] Guide  Direct Dial: [phone]  Fax [phone]

## 2023-11-20 DIAGNOSIS — S72001D Fracture of unspecified part of neck of right femur, subsequent encounter for closed fracture with routine healing: Secondary | ICD-10-CM | POA: Diagnosis not present

## 2023-11-20 DIAGNOSIS — K2100 Gastro-esophageal reflux disease with esophagitis, without bleeding: Secondary | ICD-10-CM | POA: Diagnosis not present

## 2023-11-20 DIAGNOSIS — K580 Irritable bowel syndrome with diarrhea: Secondary | ICD-10-CM | POA: Diagnosis not present

## 2023-11-20 DIAGNOSIS — E1122 Type 2 diabetes mellitus with diabetic chronic kidney disease: Secondary | ICD-10-CM | POA: Diagnosis not present

## 2023-11-20 DIAGNOSIS — I10 Essential (primary) hypertension: Secondary | ICD-10-CM | POA: Diagnosis not present

## 2023-11-20 DIAGNOSIS — E782 Mixed hyperlipidemia: Secondary | ICD-10-CM | POA: Diagnosis not present

## 2023-11-20 DIAGNOSIS — Z8673 Personal history of transient ischemic attack (TIA), and cerebral infarction without residual deficits: Secondary | ICD-10-CM | POA: Diagnosis not present

## 2023-11-20 DIAGNOSIS — J301 Allergic rhinitis due to pollen: Secondary | ICD-10-CM | POA: Diagnosis not present

## 2023-11-20 DIAGNOSIS — D649 Anemia, unspecified: Secondary | ICD-10-CM | POA: Diagnosis not present

## 2023-11-20 DIAGNOSIS — M810 Age-related osteoporosis without current pathological fracture: Secondary | ICD-10-CM | POA: Diagnosis not present

## 2023-11-20 DIAGNOSIS — G20A1 Parkinson's disease without dyskinesia, without mention of fluctuations: Secondary | ICD-10-CM | POA: Diagnosis not present

## 2023-11-24 DIAGNOSIS — M79674 Pain in right toe(s): Secondary | ICD-10-CM | POA: Diagnosis not present

## 2023-11-24 DIAGNOSIS — M79675 Pain in left toe(s): Secondary | ICD-10-CM | POA: Diagnosis not present

## 2023-11-24 DIAGNOSIS — M79672 Pain in left foot: Secondary | ICD-10-CM | POA: Diagnosis not present

## 2023-11-24 DIAGNOSIS — M79671 Pain in right foot: Secondary | ICD-10-CM | POA: Diagnosis not present

## 2023-11-24 DIAGNOSIS — I739 Peripheral vascular disease, unspecified: Secondary | ICD-10-CM | POA: Diagnosis not present

## 2023-11-24 DIAGNOSIS — E1140 Type 2 diabetes mellitus with diabetic neuropathy, unspecified: Secondary | ICD-10-CM | POA: Diagnosis not present

## 2023-11-26 ENCOUNTER — Other Ambulatory Visit: Payer: Self-pay | Admitting: Licensed Clinical Social Worker

## 2023-11-26 NOTE — Patient Instructions (Signed)
 Visit Information  Thank you for taking time to visit with me today. Please don't hesitate to contact me if I can be of assistance to you before our next scheduled appointment.  Your next appointment is by telephone on 12/03/23 at 9 am Please call the care guide team at [phone] if you need to cancel or reschedule your appointment.   Please call the Suicide and Crisis Lifeline: 988 call the [place]  National Suicide Prevention Lifeline: (718)263-8544 or TTY: 480-096-6299 TTY 626-865-6759) to talk to a trained counselor call 1-800-273-TALK (toll free, 24 hour hotline) go to [facility] [address] [place] [phone]) call the [facility] Crisis Line: 325-550-7174 call 911 if you are experiencing a Mental Health or Behavioral Health Crisis or need someone to talk to.  Patient verbalizes understanding of instructions and care plan provided today and agrees to view in MyChart. Active MyChart status and patient understanding of how to access instructions and care plan via MyChart confirmed with patient.     Lyle Rung, BSW, MSW, LCSW Licensed Clinical Social Worker [employer] [facility]   [facility] [email or web address] Direct Dial: [phone]

## 2023-11-26 NOTE — Patient Outreach (Signed)
 Complex Care Management   Visit Note  11/26/2023  Name:  Beth Young MRN: [id number] DOB: 05-23-1945  Situation: Referral received for Complex Care Management related to Mental/Behavioral Health diagnosis counseling resources for depression. I obtained verbal consent from Patient.  Visit completed with Patient  on the phone  Background:   Past Medical History:  Diagnosis Date   Anxiety    Arthritis    Asthma    Depression    Essential tremor    with implantable brain stimulator device   GERD (gastroesophageal reflux disease)    History of stroke    Hyperlipidemia    Hypertension    Insomnia    Osteoporosis    Parkinson's disease with use of electrical brain stimulation (HCC)    Restless legs syndrome    Type 2 diabetes mellitus (HCC)     Assessment: Patient Reported Symptoms: Referral indicated that patient is requesting behavioral health resources and therapy connection. Patient shares that she is not interested in obtaining behavioral health support at this time. A past referral referral indicated that patient wanted information about assisted living facilities; however, during the call, patient stated she is not interested and is satisfied with her current living arrangements. VBCI LCSW informed patient that if her needs or interests change in the future, she can reach out for more information regarding behavioral health support or assisted living options. Patient reports her main concern is physical health at this time and is requesting nursing assistance for pain management. Referral placed and appointment was successfully scheduled for next week with [facility] RNCM.     Cognitive Cognitive Status: Able to follow simple commands, Alert and oriented to person, place, and time Cognitive/Intellectual Conditions Management [RPT]: Not Assessed   Health Maintenance Behaviors: Annual physical exam Healing Pattern: Average Health Facilitated by: Rest, Stress management  Neurological  Neurological Review of Symptoms: No symptoms reported Neurological Management Strategies: Routine screening Neurological Self-Management Outcome: 4 (good)  Psychosocial Psychosocial Symptoms Reported: Sadness - if selected complete PHQ 2-9     Quality of Family Relationships: involved, helpful Do you feel physically threatened by others?: No    11/26/2023    PHQ2-9 Depression Screening   Little interest or pleasure in doing things Not at all  Feeling down, depressed, or hopeless Several days  PHQ-2 - Total Score 1   There were no vitals filed for this visit.  Medications Reviewed Today     Reviewed by Merlynn Lyle CROME, LCSW (Social Worker) on 11/26/23 at [id number]  Med List Status: <None>   Medication Order Taking? Sig Documenting Provider Last Dose Status Informant  acetaminophen  (TYLENOL ) 500 MG tablet [id number]  Take 2 tablets (1,000 mg total) by mouth in the morning, at noon, and at bedtime. Ricky Fines, MD  Active   albuterol (PROVENTIL HFA;VENTOLIN HFA) 108 (90 Base) MCG/ACT inhaler [id number] No Inhale 1 puff into the lungs every 6 (six) hours as needed for wheezing or shortness of breath. [provider] unknown Active Self, Family Member, Pharmacy Records  alendronate (FOSAMAX) 70 MG tablet [id number] No Take 70 mg by mouth once a week. [provider] Past Week Active Self, Family Member, Pharmacy Records  atorvastatin  (LIPITOR) 80 MG tablet [id number]  Take 0.5 tablets (40 mg total) by mouth daily. Ricky Fines, MD  Active   carbidopa -levodopa  (SINEMET  IR) 25-100 MG tablet [id number] No Take 1 tablet by mouth 3 (three) times daily. [provider] 12/28/2022 Active Self, Family Member, Pharmacy Records  clopidogrel  (PLAVIX ) 75 MG tablet  [id number] No Take 75 mg by mouth daily.  [provider] 12/28/2022 Active Self, Family Member, Pharmacy Records  dicyclomine  (BENTYL ) 10 MG capsule [id number]  Take 1 capsule (10 mg total) by mouth 3 (three) times  daily as needed for spasms (diarrhea). Ricky Fines, MD  Active   doxepin [facility]) 25 MG capsule [id number] No Take 25 mg by mouth at bedtime.  [provider] 12/27/2022 Active Self, Family Member, Pharmacy Records  escitalopram  (LEXAPRO ) 20 MG tablet [id number] No Take 20 mg by mouth daily. [provider] 12/28/2022 Active Self, Family Member, Pharmacy Records  ferrous sulfate 325 (65 FE) MG tablet [id number] No Take 325 mg by mouth daily. [provider] 12/28/2022 Active Self, Family Member, Pharmacy Records  fluticasone [facility]) 50 MCG/ACT nasal spray [id number] No Place 2 sprays into both nostrils daily as needed for allergies or rhinitis. [provider] unknown Active Self, Family Member, Pharmacy Records  glipiZIDE (GLUCOTROL XL) 5 MG 24 hr tablet [id number] No Take 5 mg by mouth every morning. [provider] 12/28/2022 Active Self, Family Member, Pharmacy Records  lisinopril  (ZESTRIL ) 20 MG tablet [id number] No Take 20 mg by mouth daily.  [provider] 12/28/2022 Active Self, Family Member, Pharmacy Records  montelukast  (SINGULAIR ) 10 MG tablet [id number] No Take 10 mg by mouth daily.  [provider] 12/28/2022 Active Self, Family Member, Pharmacy Records           Med Note CLAUD MICHEAL ONEIDA Pablo Apr 20, 2017  1:37 PM)    omeprazole (PRILOSEC) 20 MG capsule [id number] No Take 20 mg by mouth daily. TAKE 1 CAPSULE (20 MG TOTAL) BY MOUTH DAILY. [provider] 12/28/2022 Active Self, Family Member, Pharmacy Records           Med Note CLAUD MICHEAL ONEIDA Pablo Apr 20, 2017  1:37 PM)    oxyCODONE  (OXY IR/ROXICODONE ) 5 MG immediate release tablet [id number]  Take 1-2 tablets (5-10 mg total) by mouth every 6 (six) hours as needed for severe pain (pain score 7-10). Ricky Fines, MD  Active   propranolol  ER (INDERAL  LA) 80 MG 24 hr capsule [id number] No Take 80 mg by mouth daily. For hypertension [provider]  12/28/2022 Active Self, Family Member, Pharmacy Records  topiramate (TOPAMAX) 50 MG tablet [id number] No Take 50 mg by mouth at bedtime.  [provider] 12/27/2022 Active Self, Family Member, Pharmacy Records           Med Note BLASE, LONELL MARLA Repress Dec 28, 2022  6:25 PM)              Recommendation:   PCP Follow-up Continue Current Plan of Care  Follow Up Plan:   Initial VBCI RNCM appointment scheduled for 12/03/23 at 9 am  Lyle Rung, BSW, MSW, [employer] Licensed Clinical Social Worker [employer] [facility]   [email or web address] Direct Dial: [phone]

## 2023-12-03 ENCOUNTER — Telehealth: Payer: Self-pay

## 2023-12-11 DIAGNOSIS — E782 Mixed hyperlipidemia: Secondary | ICD-10-CM | POA: Diagnosis not present

## 2023-12-11 DIAGNOSIS — G20A1 Parkinson's disease without dyskinesia, without mention of fluctuations: Secondary | ICD-10-CM | POA: Diagnosis not present

## 2023-12-11 DIAGNOSIS — K2100 Gastro-esophageal reflux disease with esophagitis, without bleeding: Secondary | ICD-10-CM | POA: Diagnosis not present

## 2023-12-11 DIAGNOSIS — E1122 Type 2 diabetes mellitus with diabetic chronic kidney disease: Secondary | ICD-10-CM | POA: Diagnosis not present

## 2023-12-11 DIAGNOSIS — M810 Age-related osteoporosis without current pathological fracture: Secondary | ICD-10-CM | POA: Diagnosis not present

## 2023-12-11 DIAGNOSIS — K580 Irritable bowel syndrome with diarrhea: Secondary | ICD-10-CM | POA: Diagnosis not present

## 2023-12-11 DIAGNOSIS — S72001D Fracture of unspecified part of neck of right femur, subsequent encounter for closed fracture with routine healing: Secondary | ICD-10-CM | POA: Diagnosis not present

## 2023-12-11 DIAGNOSIS — I10 Essential (primary) hypertension: Secondary | ICD-10-CM | POA: Diagnosis not present

## 2023-12-11 DIAGNOSIS — J301 Allergic rhinitis due to pollen: Secondary | ICD-10-CM | POA: Diagnosis not present

## 2023-12-11 DIAGNOSIS — Z8673 Personal history of transient ischemic attack (TIA), and cerebral infarction without residual deficits: Secondary | ICD-10-CM | POA: Diagnosis not present

## 2023-12-11 DIAGNOSIS — D649 Anemia, unspecified: Secondary | ICD-10-CM | POA: Diagnosis not present

## 2023-12-14 ENCOUNTER — Telehealth: Payer: Self-pay

## 2023-12-15 ENCOUNTER — Other Ambulatory Visit

## 2023-12-15 NOTE — Patient Outreach (Signed)
 Contacted patient, discussed need to connect patient with [facility] Dual Complete Care Navigator.  [employer] representative provided assigned care navigator name and [place]: [phone], ext [phone].  Left voicemail due to care navigator was on another call.  Patient understands she will be managed by [facility] care navigator. This RNCM informed patient of [facility] Parkinson's support group, to go online to find out more information about how to sign up for classes.  Denies any other question or concerns.

## 2023-12-15 NOTE — Patient Instructions (Signed)
 Visit Information  Thank you for taking time to visit with me today.   Here is the contact information for your [facility] Dual Complete Care Navigator (nurse case manager): phone [phone] ext [phone].  I was unable to submit your phone number to the [facility] Parkinson's Support Program for more information.  If you do not have internet access, please enlist the help of a family member to go online and [employer] [facility] Parkinson's Program and you can find out more information on the website.  Or you can call the main hospital number and they can connect you: [phone].   Our next appointment is no further scheduled appointments.     A reminder to ALL patients/family/friends, please call the [place]  National Suicide Prevention Lifeline: 984-351-0884 or TTY: 902-665-0278 TTY (818)659-3926) to talk to a trained counselor if you are experiencing a Mental Health or Behavioral Health Crisis or need someone to talk to.  The patient verbalized understanding of instructions, educational materials, and care plan provided today and agreed to receive a mailed copy of patient instructions, educational materials, and care plan.   Santana Stamp BSN, CCM [place]  [facility] Population Health RN Care Manager Direct Dial: [phone]  Fax: [phone]

## 2023-12-15 NOTE — Patient Instructions (Signed)
 Jessie F Cohea - I am sorry I was unable to reach you today for our scheduled appointment. I work with [facility] and am calling to support your healthcare needs. Please contact me at [phone] at your earliest convenience. I look forward to speaking with you soon.   Thank you,  Santana Stamp BSN, CCM [place]  [facility] Population Health RN Care Manager Direct Dial: [phone]  Fax: [phone]

## 2024-01-01 DIAGNOSIS — J301 Allergic rhinitis due to pollen: Secondary | ICD-10-CM | POA: Diagnosis not present

## 2024-01-01 DIAGNOSIS — S72001D Fracture of unspecified part of neck of right femur, subsequent encounter for closed fracture with routine healing: Secondary | ICD-10-CM | POA: Diagnosis not present

## 2024-01-01 DIAGNOSIS — D649 Anemia, unspecified: Secondary | ICD-10-CM | POA: Diagnosis not present

## 2024-01-01 DIAGNOSIS — K580 Irritable bowel syndrome with diarrhea: Secondary | ICD-10-CM | POA: Diagnosis not present

## 2024-01-01 DIAGNOSIS — M810 Age-related osteoporosis without current pathological fracture: Secondary | ICD-10-CM | POA: Diagnosis not present

## 2024-01-01 DIAGNOSIS — K2100 Gastro-esophageal reflux disease with esophagitis, without bleeding: Secondary | ICD-10-CM | POA: Diagnosis not present

## 2024-01-01 DIAGNOSIS — E782 Mixed hyperlipidemia: Secondary | ICD-10-CM | POA: Diagnosis not present

## 2024-01-01 DIAGNOSIS — Z8673 Personal history of transient ischemic attack (TIA), and cerebral infarction without residual deficits: Secondary | ICD-10-CM | POA: Diagnosis not present

## 2024-01-01 DIAGNOSIS — I10 Essential (primary) hypertension: Secondary | ICD-10-CM | POA: Diagnosis not present

## 2024-01-01 DIAGNOSIS — G20A1 Parkinson's disease without dyskinesia, without mention of fluctuations: Secondary | ICD-10-CM | POA: Diagnosis not present

## 2024-01-01 DIAGNOSIS — E1122 Type 2 diabetes mellitus with diabetic chronic kidney disease: Secondary | ICD-10-CM | POA: Diagnosis not present

## 2024-05-16 ENCOUNTER — Ambulatory Visit: Admitting: Neurology
